# Patient Record
Sex: Male | Born: 1965
Health system: Southern US, Community
[De-identification: ages and names within clinical notes are randomized; demographics above are authoritative.]

## PROBLEM LIST (undated history)

## (undated) DIAGNOSIS — Z21 Asymptomatic human immunodeficiency virus [HIV] infection status: Secondary | ICD-10-CM

## (undated) DIAGNOSIS — J45909 Unspecified asthma, uncomplicated: Secondary | ICD-10-CM

## (undated) DIAGNOSIS — B20 Human immunodeficiency virus [HIV] disease: Secondary | ICD-10-CM

---

## 2014-02-19 ENCOUNTER — Emergency Department: Payer: Self-pay | Admitting: Emergency Medicine

## 2015-04-27 ENCOUNTER — Encounter (HOSPITAL_BASED_OUTPATIENT_CLINIC_OR_DEPARTMENT_OTHER): Payer: Self-pay | Admitting: Emergency Medicine

## 2015-04-27 ENCOUNTER — Emergency Department (HOSPITAL_BASED_OUTPATIENT_CLINIC_OR_DEPARTMENT_OTHER)
Admission: EM | Admit: 2015-04-27 | Discharge: 2015-04-27 | Disposition: A | Discharge status: Home / Self Care | Payer: Commercial Managed Care - PPO | Attending: Emergency Medicine | Admitting: Emergency Medicine

## 2015-04-27 DIAGNOSIS — J069 Acute upper respiratory infection, unspecified: Secondary | ICD-10-CM | POA: Diagnosis not present

## 2015-04-27 DIAGNOSIS — Z880 Allergy status to penicillin: Secondary | ICD-10-CM | POA: Insufficient documentation

## 2015-04-27 DIAGNOSIS — J45909 Unspecified asthma, uncomplicated: Secondary | ICD-10-CM

## 2015-04-27 DIAGNOSIS — J45901 Unspecified asthma with (acute) exacerbation: Secondary | ICD-10-CM | POA: Insufficient documentation

## 2015-04-27 DIAGNOSIS — R0602 Shortness of breath: Secondary | ICD-10-CM | POA: Diagnosis present

## 2015-04-27 DIAGNOSIS — Z7952 Long term (current) use of systemic steroids: Secondary | ICD-10-CM | POA: Diagnosis not present

## 2015-04-27 DIAGNOSIS — R110 Nausea: Secondary | ICD-10-CM | POA: Diagnosis not present

## 2015-04-27 MED ORDER — PREDNISONE 50 MG PO TABS
60.0000 mg | ORAL_TABLET | Freq: Once | ORAL | Status: AC
  Administered 2015-04-27: 60 mg via ORAL
  Filled 2015-04-27: qty 1

## 2015-04-27 NOTE — ED Notes (Signed)
Pt reports sob onset of Sunday thought he was catching a cold, so dismissed it and today presents with worsening sob

## 2015-04-27 NOTE — ED Provider Notes (Signed)
CSN: [id number]     Arrival date & time 04/27/15  3244 History   First MD Initiated Contact with Patient 04/27/15 [phone]     Chief Complaint  Patient presents with  . Shortness of Breath     (Consider location/radiation/quality/duration/timing/severity/associated sxs/prior Treatment) Patient is a 49 y.o. male presenting with shortness of breath. The history is provided by the patient.  Shortness of Breath Associated symptoms: cough and wheezing   Associated symptoms: no abdominal pain, no chest pain, no fever, no rash and no sore throat    patient presents shortness of breath and cough. Has had for last couple days. Has had breathing treatment and sterilized. States he vomited up a steroid-induced after coughing today. His history of asthma. He is overall pretty well-controlled. States he felt a little worse this morning was told to come in by his nurse. States he is feeling much better now. States he vomited up the prednisone this morning but has a Dosepak. No chest pain. No fevers. No real nausea but states he thinks he threw up because of his vitamins.  History reviewed. No pertinent past medical history. History reviewed. No pertinent past surgical history. History reviewed. No pertinent family history. Social History  Substance Use Topics  . Smoking status: Never Smoker   . Smokeless tobacco: None  . Alcohol Use: No    Review of Systems  Constitutional: Negative for fever and appetite change.  HENT: Negative for sinus pressure and sore throat.   Respiratory: Positive for cough, shortness of breath and wheezing.   Cardiovascular: Negative for chest pain.  Gastrointestinal: Positive for nausea. Negative for abdominal pain.  Skin: Negative for rash.      Allergies  Penicillins  Home Medications   Prior to Admission medications   Medication Sig Start Date End Date Taking? Authorizing Provider  albuterol (PROVENTIL HFA;VENTOLIN HFA) 108 (90 BASE) MCG/ACT inhaler Inhale into  the lungs every 6 (six) hours as needed for wheezing or shortness of breath.   Yes Historical Provider, MD  predniSONE (STERAPRED UNI-PAK 21 TAB) 10 MG (21) TBPK tablet Take 10 mg by mouth daily.   Yes Historical Provider, MD   BP 144/105 mmHg  Pulse 87  Temp(Src) 98.1 F (36.7 C) (Oral)  Resp 20  Ht  (1.88 m)  Wt 220 lb (99.791 kg)  BMI 28.23 kg/m2  SpO2 100% Physical Exam  Constitutional: He appears well-developed.  Cardiovascular: Normal rate.   Pulmonary/Chest: He has wheezes.  Few scattered wheezes and some slightly prolonged expirations.  Abdominal: Soft.  Musculoskeletal: Normal range of motion.  Neurological: He is alert.    ED Course  Procedures (including critical care time) Labs Review Labs Reviewed - No data to display  Imaging Review No results found. I have personally reviewed and evaluated these images and lab results as part of my medical decision-making.   EKG Interpretation None      MDM   Final diagnoses:  URI (upper respiratory infection)  Asthma, unspecified asthma severity, uncomplicated    Patient with likely viral syndrome drum and bronchospasm. Overall well-appearing. States he feels better. Patient states he is only mild nausea and does not want and antiemetics. Will give dose of cirrhosis here discharge home.    Benjiman Core, MD 04/27/15 1026

## 2015-04-27 NOTE — Discharge Instructions (Signed)
Asthma, Acute Bronchospasm  Acute bronchospasm caused by asthma is also referred to as an asthma attack. Bronchospasm means your air passages become narrowed. The narrowing is caused by inflammation and tightening of the muscles in the air tubes (bronchi) in your lungs. This can make it hard to breathe or cause you to wheeze and cough.  CAUSES  Possible triggers are:   Animal dander from the skin, hair, or feathers of animals.   Dust mites contained in house dust.   Cockroaches.   Pollen from trees or grass.   Mold.   Cigarette or tobacco smoke.   Air pollutants such as dust, household cleaners, hair sprays, aerosol sprays, paint fumes, strong chemicals, or strong odors.   Cold air or weather changes. Cold air may trigger inflammation. Winds increase molds and pollens in the air.   Strong emotions such as crying or laughing hard.   Stress.   Certain medicines such as aspirin or beta-blockers.   Sulfites in foods and drinks, such as dried fruits and wine.   Infections or inflammatory conditions, such as a flu, cold, or inflammation of the nasal membranes (rhinitis).   Gastroesophageal reflux disease (GERD). GERD is a condition where stomach acid backs up into your esophagus.   Exercise or strenuous activity.  SIGNS AND SYMPTOMS    Wheezing.   Excessive coughing, particularly at night.   Chest tightness.   Shortness of breath.  DIAGNOSIS   Your health care provider will ask you about your medical history and perform a physical exam. A chest X-ray or blood testing may be performed to look for other causes of your symptoms or other conditions that may have triggered your asthma attack.  TREATMENT   Treatment is aimed at reducing inflammation and opening up the airways in your lungs. Most asthma attacks are treated with inhaled medicines. These include quick relief or rescue medicines (such as bronchodilators) and controller medicines (such as inhaled corticosteroids). These medicines are sometimes  given through an inhaler or a nebulizer. Systemic steroid medicine taken by mouth or given through an IV tube also can be used to reduce the inflammation when an attack is moderate or severe. Antibiotic medicines are only used if a bacterial infection is present.   HOME CARE INSTRUCTIONS    Rest.   Drink plenty of liquids. This helps the mucus to remain thin and be easily coughed up. Only use caffeine in moderation and do not use alcohol until you have recovered from your illness.   Do not smoke. Avoid being exposed to secondhand smoke.   You play a critical role in keeping yourself in good health. Avoid exposure to things that cause you to wheeze or to have breathing problems.   Keep your medicines up-to-date and available. Carefully follow your health care provider's treatment plan.   Take your medicine exactly as prescribed.   When pollen or pollution is bad, keep windows closed and use an air conditioner or go to places with air conditioning.   Asthma requires careful medical care. See your health care provider for a follow-up as advised. If you are more than [redacted] weeks pregnant and you were prescribed any new medicines, let your obstetrician know about the visit and how you are doing. Follow up with your health care provider as directed.   After you have recovered from your asthma attack, make an appointment with your outpatient doctor to talk about ways to reduce the likelihood of future attacks. If you do not have a doctor   who manages your asthma, make an appointment with a primary care doctor to discuss your asthma.  SEEK IMMEDIATE MEDICAL CARE IF:    You are getting worse.   You have trouble breathing. If severe, call your local emergency services (911 in the [place]   You develop chest pain or discomfort.   You are vomiting.   You are not able to keep fluids down.   You are coughing up yellow, green, brown, or bloody sputum.   You have a fever and your symptoms suddenly get worse.   You have  trouble swallowing.  MAKE SURE YOU:    Understand these instructions.   Will watch your condition.   Will get help right away if you are not doing well or get worse.     This information is not intended to replace advice given to you by your health care provider. Make sure you discuss any questions you have with your health care provider.     Document Released: 08/30/2006 Document Revised: 05/20/2013 Document Reviewed: 11/20/2012  Elsevier Interactive Patient Education 2016 [employer]    Upper Respiratory Infection, Adult  Most upper respiratory infections (URIs) are a viral infection of the air passages leading to the lungs. A URI affects the nose, throat, and upper air passages. The most common type of URI is nasopharyngitis and is typically referred to as "the common cold."  URIs run their course and usually go away on their own. Most of the time, a URI does not require medical attention, but sometimes a bacterial infection in the upper airways can follow a viral infection. This is called a secondary infection. Sinus and middle ear infections are common types of secondary upper respiratory infections.  Bacterial pneumonia can also complicate a URI. A URI can worsen asthma and chronic obstructive pulmonary disease (COPD). Sometimes, these complications can require emergency medical care and may be life threatening.   CAUSES  Almost all URIs are caused by viruses. A virus is a type of germ and can spread from one person to another.   RISKS FACTORS  You may be at risk for a URI if:    You smoke.    You have chronic heart or lung disease.   You have a weakened defense (immune) system.    You are very young or very old.    You have nasal allergies or asthma.   You work in crowded or poorly ventilated areas.   You work in health care facilities or schools.  SIGNS AND SYMPTOMS   Symptoms typically develop 2-3 days after you come in contact with a cold virus. Most viral URIs last 7-10 days. However,  viral URIs from the influenza virus (flu virus) can last 14-18 days and are typically more severe. Symptoms may include:    Runny or stuffy (congested) nose.    Sneezing.    Cough.    Sore throat.    Headache.    Fatigue.    Fever.    Loss of appetite.    Pain in your forehead, behind your eyes, and over your cheekbones (sinus pain).   Muscle aches.   DIAGNOSIS   Your health care provider may diagnose a URI by:   Physical exam.   Tests to check that your symptoms are not due to another condition such as:   Strep throat.   Sinusitis.   Pneumonia.   Asthma.  TREATMENT   A URI goes away on its own with time. It cannot be cured   with medicines, but medicines may be prescribed or recommended to relieve symptoms. Medicines may help:   Reduce your fever.   Reduce your cough.   Relieve nasal congestion.  HOME CARE INSTRUCTIONS    Take medicines only as directed by your health care provider.    Gargle warm saltwater or take cough drops to comfort your throat as directed by your health care provider.   Use a warm mist humidifier or inhale steam from a shower to increase air moisture. This may make it easier to breathe.   Drink enough fluid to keep your urine clear or pale yellow.    Eat soups and other clear broths and maintain good nutrition.    Rest as needed.    Return to work when your temperature has returned to normal or as your health care provider advises. You may need to stay home longer to avoid infecting others. You can also use a face mask and careful hand washing to prevent spread of the virus.   Increase the usage of your inhaler if you have asthma.    Do not use any tobacco products, including cigarettes, chewing tobacco, or electronic cigarettes. If you need help quitting, ask your health care provider.  PREVENTION   The best way to protect yourself from getting a cold is to practice good hygiene.    Avoid oral or hand contact with people with cold symptoms.    Wash  your hands often if contact occurs.   There is no clear evidence that vitamin C, vitamin E, echinacea, or exercise reduces the chance of developing a cold. However, it is always recommended to get plenty of rest, exercise, and practice good nutrition.   SEEK MEDICAL CARE IF:    You are getting worse rather than better.    Your symptoms are not controlled by medicine.    You have chills.   You have worsening shortness of breath.   You have brown or red mucus.   You have yellow or brown nasal discharge.   You have pain in your face, especially when you bend forward.   You have a fever.   You have swollen neck glands.   You have pain while swallowing.   You have white areas in the back of your throat.  SEEK IMMEDIATE MEDICAL CARE IF:    You have severe or persistent:    Headache.    Ear pain.    Sinus pain.    Chest pain.   You have chronic lung disease and any of the following:    Wheezing.    Prolonged cough.    Coughing up blood.    A change in your usual mucus.   You have a stiff neck.   You have changes in your:    Vision.    Hearing.    Thinking.    Mood.  MAKE SURE YOU:    Understand these instructions.   Will watch your condition.   Will get help right away if you are not doing well or get worse.     This information is not intended to replace advice given to you by your health care provider. Make sure you discuss any questions you have with your health care provider.     Document Released: 11/08/2000 Document Revised: 09/29/2014 Document Reviewed: 08/20/2013  Elsevier Interactive Patient Education 2016 [employer]

## 2017-08-14 ENCOUNTER — Encounter (HOSPITAL_COMMUNITY): Payer: Self-pay

## 2017-08-14 ENCOUNTER — Other Ambulatory Visit: Payer: Self-pay

## 2017-08-14 ENCOUNTER — Emergency Department (HOSPITAL_COMMUNITY)
Admission: EM | Admit: 2017-08-14 | Discharge: 2017-08-14 | Disposition: A | Discharge status: Home / Self Care | Payer: Commercial Managed Care - PPO | Attending: Emergency Medicine | Admitting: Emergency Medicine

## 2017-08-14 DIAGNOSIS — F17210 Nicotine dependence, cigarettes, uncomplicated: Secondary | ICD-10-CM | POA: Diagnosis not present

## 2017-08-14 DIAGNOSIS — B029 Zoster without complications: Secondary | ICD-10-CM

## 2017-08-14 DIAGNOSIS — R21 Rash and other nonspecific skin eruption: Secondary | ICD-10-CM | POA: Diagnosis present

## 2017-08-14 HISTORY — DX: Unspecified asthma, uncomplicated: J45.909

## 2017-08-14 MED ORDER — VALACYCLOVIR HCL 1 G PO TABS: 1000.0000 mg | ORAL_TABLET | Freq: Three times a day (TID) | ORAL | 0 refills | Status: DC

## 2017-08-14 MED ORDER — OXYCODONE-ACETAMINOPHEN 5-325 MG PO TABS
2.0000 | ORAL_TABLET | Freq: Once | ORAL | Status: AC
  Administered 2017-08-14: 2 via ORAL
  Filled 2017-08-14: qty 2

## 2017-08-14 MED ORDER — OXYCODONE-ACETAMINOPHEN 5-325 MG PO TABS: 2.0000 | ORAL_TABLET | ORAL | 0 refills | Status: DC | PRN

## 2017-08-14 NOTE — ED Notes (Signed)
Patient states he has a ride on the way to pick him up since we are giving him pain medication. Pt requesting a work note to return next Monday. Dr. Freida Busman to clarify before patient d/c.

## 2017-08-14 NOTE — ED Triage Notes (Signed)
Patient reports that he developed a rash on the left side of his head 4 days. Patient states he went to an UC 3 days ago and received a steroid IM and was placed on clindamycin. Patient states he is now nauseated and the rash and swelling to the head is worse.

## 2017-08-14 NOTE — ED Provider Notes (Signed)
[facility]-EMERGENCY DEPT Provider Note   CSN: [id number] Arrival date & time: 08/14/17  9562     History   Chief Complaint Chief Complaint  Patient presents with  . David [name]    HPI David David [name] is a 52 y.o. male.  53 year old male presents with David [name] to the left side of his face times 3 days.  Was seen at urgent care and prescribed clindamycin.  Also given an IM injection of steroids.  His symptoms have since worsened and he characterizes his rashes blisterlike as well as with a burning sensation to his skin.  Denies any hearing or vertigo symptoms.  No visual changes.  No fever or chills.  Nothing makes his symptoms better      Past Medical History:  Diagnosis Date  . Asthma     There are no active problems to display for this patient.   History reviewed. No pertinent surgical history.     Home Medications    Prior to Admission medications   Not on File    Family History Family History  Problem Relation Age of Onset  . Cancer Mother     Social History Social History   Tobacco Use  . Smoking status: Current Every Day Smoker    Packs/day: 0.25    Types: Cigarettes  . Smokeless tobacco: Never Used  Substance Use Topics  . Alcohol use: No    Frequency: Never  . Drug use: No     Allergies   Penicillins   Review of Systems Review of Systems  All other systems reviewed and are negative.    Physical Exam Updated Vital Signs BP (!) 178/107 (BP Location: Right Arm)   Pulse 93   Temp 98.5 F (36.9 C) (Oral)   Resp 16   Ht 1.88 m (6\' 2" )   Wt 97.5 kg (215 lb)   SpO2 97%   BMI 27.60 kg/m   Physical Exam  Constitutional: He is oriented to person, place, and time. He appears well-developed and well-nourished.  Non-toxic appearance. No distress.  HENT:  Head: Normocephalic and atraumatic.    Eyes: Conjunctivae, EOM and lids are normal. Pupils are equal, round, and reactive to light.  Neck: Normal range of motion. Neck  supple. No tracheal deviation present. No thyroid mass present.  Cardiovascular: Normal rate, regular rhythm and normal heart sounds. Exam reveals no gallop.  No murmur heard. Pulmonary/Chest: Effort normal and breath sounds normal. No stridor. No respiratory distress. He has no decreased breath sounds. He has no wheezes. He has no rhonchi. He has no rales.  Abdominal: Soft. Normal appearance and bowel sounds are normal. He exhibits no distension. There is no tenderness. There is no rebound and no CVA tenderness.  Musculoskeletal: Normal range of motion. He exhibits no edema or tenderness.  Neurological: He is alert and oriented to person, place, and time. He has normal strength. No cranial nerve deficit or sensory deficit. GCS eye subscore is 4. GCS verbal subscore is 5. GCS motor subscore is 6.  Skin: Skin is warm and dry. David [name] noted. No abrasion noted. David [name] is papular and vesicular.  Psychiatric: He has a normal mood and affect. His speech is normal and behavior is normal.  Nursing note and vitals reviewed.    ED Treatments / Results  Labs (all labs ordered are listed, but only abnormal results are displayed) Labs Reviewed - No data to display  EKG  EKG Interpretation None       Radiology No results  found.  Procedures Procedures (including critical care time)  Medications Ordered in ED Medications  oxyCODONE-acetaminophen (PERCOCET/ROXICET) 5-325 MG per tablet 2 tablet (not administered)     Initial Impression / Assessment and Plan / ED Course  I have reviewed the triage vital signs and the nursing notes.  Pertinent labs & imaging results that were available during my care of the patient were reviewed by me and considered in my medical decision making (see chart for details).    Patient's David [name] consistent with shingles.  Will prescribe Percocet here for pain.  Also placed on Valtrex.  Return precautions given  Final Clinical Impressions(s) / ED Diagnoses   Final  diagnoses:  None    ED Discharge Orders    None       Lorre Nick, MD 08/14/17 [id number]

## 2017-08-14 NOTE — Discharge Instructions (Signed)
Continue taking clindamycin.  Return here for fever or any other problems

## 2017-08-15 ENCOUNTER — Encounter (HOSPITAL_BASED_OUTPATIENT_CLINIC_OR_DEPARTMENT_OTHER): Payer: Self-pay | Admitting: Emergency Medicine

## 2018-12-09 ENCOUNTER — Emergency Department: Payer: 59

## 2018-12-09 ENCOUNTER — Emergency Department
Admission: EM | Admit: 2018-12-09 | Discharge: 2018-12-09 | Disposition: A | Discharge status: Home / Self Care | Payer: 59 | Attending: Emergency Medicine | Admitting: Emergency Medicine

## 2018-12-09 ENCOUNTER — Encounter: Payer: Self-pay | Admitting: Emergency Medicine

## 2018-12-09 ENCOUNTER — Other Ambulatory Visit: Payer: Self-pay

## 2018-12-09 DIAGNOSIS — R062 Wheezing: Secondary | ICD-10-CM | POA: Diagnosis present

## 2018-12-09 DIAGNOSIS — Z20828 Contact with and (suspected) exposure to other viral communicable diseases: Secondary | ICD-10-CM | POA: Diagnosis not present

## 2018-12-09 DIAGNOSIS — F17210 Nicotine dependence, cigarettes, uncomplicated: Secondary | ICD-10-CM | POA: Insufficient documentation

## 2018-12-09 DIAGNOSIS — J45901 Unspecified asthma with (acute) exacerbation: Secondary | ICD-10-CM | POA: Diagnosis not present

## 2018-12-09 LAB — BASIC METABOLIC PANEL
Anion gap: 6 (ref 5–15)
BUN: 17 mg/dL (ref 6–20)
CO2: 25 mmol/L (ref 22–32)
Calcium: 8.7 mg/dL — ABNORMAL LOW (ref 8.9–10.3)
Chloride: 105 mmol/L (ref 98–111)
Creatinine, Ser: 1.15 mg/dL (ref 0.61–1.24)
GFR calc Af Amer: >60 mL/min (ref >60)
GFR calc non Af Amer: >60 mL/min (ref >60)
Glucose, Bld: 110 mg/dL — ABNORMAL HIGH (ref 70–99)
Potassium: 4.3 mmol/L (ref 3.5–5.1)
Sodium: 136 mmol/L (ref 135–145)

## 2018-12-09 LAB — CBC
HCT: 36.9 % — ABNORMAL LOW (ref 39.0–52.0)
Hemoglobin: 12.2 g/dL — ABNORMAL LOW (ref 13.0–17.0)
MCH: 33.2 pg (ref 26.0–34.0)
MCHC: 33.1 g/dL (ref 30.0–36.0)
MCV: 100.3 fL — ABNORMAL HIGH (ref 80.0–100.0)
Platelets: 254 10*3/uL (ref 150–400)
RBC: 3.68 MIL/uL — ABNORMAL LOW (ref 4.22–5.81)
RDW: 12.3 % (ref 11.5–15.5)
WBC: 7.8 10*3/uL (ref 4.0–10.5)
nRBC: 0 % (ref 0.0–0.2)

## 2018-12-09 LAB — SARS CORONAVIRUS 2 BY RT PCR (HOSPITAL ORDER, PERFORMED IN ~~LOC~~ HOSPITAL LAB): SARS Coronavirus 2: NEGATIVE

## 2018-12-09 MED ORDER — IPRATROPIUM-ALBUTEROL 0.5-2.5 (3) MG/3ML IN SOLN
3.0000 mL | Freq: Once | RESPIRATORY_TRACT | Status: AC
  Administered 2018-12-09: 3 mL via RESPIRATORY_TRACT
  Filled 2018-12-09: qty 3

## 2018-12-09 MED ORDER — ALBUTEROL SULFATE HFA 108 (90 BASE) MCG/ACT IN AERS
2.0000 | INHALATION_SPRAY | Freq: Once | RESPIRATORY_TRACT | Status: AC
  Administered 2018-12-09: 2 via RESPIRATORY_TRACT

## 2018-12-09 MED ORDER — METHYLPREDNISOLONE SODIUM SUCC 125 MG IJ SOLR
125.0000 mg | INTRAMUSCULAR | Status: AC
  Administered 2018-12-09: 125 mg via INTRAVENOUS
  Filled 2018-12-09: qty 2

## 2018-12-09 MED ORDER — ALBUTEROL SULFATE (2.5 MG/3ML) 0.083% IN NEBU
INHALATION_SOLUTION | RESPIRATORY_TRACT | Status: AC
  Filled 2018-12-09: qty 6

## 2018-12-09 MED ORDER — ALBUTEROL SULFATE (2.5 MG/3ML) 0.083% IN NEBU: 2.5000 mg | INHALATION_SOLUTION | Freq: Four times a day (QID) | RESPIRATORY_TRACT | 1 refills | Status: DC | PRN

## 2018-12-09 MED ORDER — IPRATROPIUM BROMIDE HFA 17 MCG/ACT IN AERS
2.0000 | INHALATION_SPRAY | Freq: Once | RESPIRATORY_TRACT | Status: AC
  Administered 2018-12-09: 2 via RESPIRATORY_TRACT
  Filled 2018-12-09 (×2): qty 12.9

## 2018-12-09 MED ORDER — ALBUTEROL SULFATE HFA 108 (90 BASE) MCG/ACT IN AERS
4.0000 | INHALATION_SPRAY | RESPIRATORY_TRACT | Status: DC | PRN
  Administered 2018-12-09: 4 via RESPIRATORY_TRACT
  Filled 2018-12-09: qty 6.7

## 2018-12-09 MED ORDER — MAGNESIUM SULFATE 2 GM/50ML IV SOLN
2.0000 g | Freq: Once | INTRAVENOUS | Status: AC
  Administered 2018-12-09: 2 g via INTRAVENOUS
  Filled 2018-12-09: qty 50

## 2018-12-09 MED ORDER — ALBUTEROL SULFATE HFA 108 (90 BASE) MCG/ACT IN AERS: 2.0000 | INHALATION_SPRAY | Freq: Four times a day (QID) | RESPIRATORY_TRACT | 1 refills | Status: DC | PRN

## 2018-12-09 MED ORDER — PREDNISONE 20 MG PO TABS: 40.0000 mg | ORAL_TABLET | Freq: Every day | ORAL | 0 refills | Status: DC

## 2018-12-09 NOTE — ED Provider Notes (Signed)
[facility] Emergency Department Provider Note  ____________________________________________   First MD Initiated Contact with Patient 12/09/18 [phone]     (approximate)  I have reviewed the triage vital signs and the nursing notes.   HISTORY  Chief Complaint Wheezing  HPI David [name] is a 53 y.o. male here for evaluation of  asthma  Patient reports couple days ago started get slight nasal congestion, then yesterday started develop wheezing and shortness of breath.  However he ran out of his nebulizers and did not have any refill on his inhaler.  So reports throughout the day got more short of breath and wheezing  He denies fevers or chills.  No known exposure to coronavirus.  Reports a known history of asthma  No chest pain.  No loss of taste or smell.  She reports his wheezing slight dry cough that he reports goes along with his asthma.  Is been treated in the past with inhalers and nebulizers that worked well he just did not have any  Past Medical History:  Diagnosis Date  . Asthma     There are no active problems to display for this patient.   History reviewed. No pertinent surgical history.  Prior to Admission medications   Medication Sig Start Date End Date Taking? Authorizing Provider  albuterol (PROVENTIL) (2.5 MG/3ML) 0.083% nebulizer solution Take 3 mLs (2.5 mg total) by nebulization every 6 (six) hours as needed for wheezing or shortness of breath. 12/09/18   Sharyn CreamerQuale, Dayanis Bergquist, MD  albuterol (VENTOLIN HFA) 108 (90 Base) MCG/ACT inhaler Inhale 2 puffs into the lungs every 6 (six) hours as needed for wheezing or shortness of breath. 12/09/18   Sharyn CreamerQuale, Leialoha Hanna, MD  predniSONE (DELTASONE) 20 MG tablet Take 2 tablets (40 mg total) by mouth daily. 12/09/18   Sharyn CreamerQuale, Findley Blankenbaker, MD    Allergies Penicillins and Penicillins  Family History  Problem Relation Age of Onset  . Cancer Mother     Social History Social History   Tobacco Use  . Smoking  status: Current Every Day Smoker    Packs/day: 0.25    Types: Cigarettes  . Smokeless tobacco: Never Used  Substance Use Topics  . Alcohol use: No    Frequency: Never  . Drug use: No    Review of Systems Constitutional: No fever/chills Eyes: No visual changes. ENT: No sore throat.  Did have just a slight amount nasal congestion last couple days. Cardiovascular: Denies chest pain. Respiratory: See HPI Gastrointestinal: No abdominal pain.   Genitourinary: Negative for dysuria. Musculoskeletal: Negative for back pain. Skin: Negative for rash. Neurological: Negative for headaches, areas of focal weakness or numbness.    ____________________________________________   PHYSICAL EXAM:  VITAL SIGNS: ED Triage Vitals  Enc Vitals Group     BP 12/09/18 0814 (!) 164/101     Pulse Rate 12/09/18 0814 100     Resp 12/09/18 0814 (!) 22     Temp 12/09/18 0814 98.1 F (36.7 C)     Temp Source 12/09/18 0814 Oral     SpO2 12/09/18 0814 93 %     Weight 12/09/18 0812 214 lb 15.2 oz (97.5 kg)     Height 12/09/18 0812 6\' 2"  (1.88 m)     Head Circumference --      Peak Flow --      Pain Score 12/09/18 0812 0     Pain Loc --      Pain Edu? --      Excl. in GC? --  Constitutional: Alert and oriented. Well appearing with mild increased work of breathing and slight accessory muscle use. Eyes: Conjunctivae are normal. Head: Atraumatic. Nose: No congestion/rhinnorhea. Mouth/Throat: Mucous membranes are moist. Neck: No stridor.  Cardiovascular: Normal rate, regular rhythm. Grossly normal heart sounds.  Good peripheral circulation. Respiratory: Slightly increased work of breathing.  Mild use of accessory muscles.  Oxygen saturation about 91 to 92% on room air.  Diffuse end expiratory wheezing without crackles. Gastrointestinal: Soft and nontender. No distention. Musculoskeletal: No lower extremity tenderness nor edema. Neurologic:  Normal speech and language. No gross focal neurologic  deficits are appreciated.  Skin:  Skin is warm, dry and intact. No rash noted. Psychiatric: Mood and affect are normal. Speech and behavior are normal.  ____________________________________________   LABS (all labs ordered are listed, but only abnormal results are displayed)  Labs Reviewed  CBC - Abnormal; Notable for the following components:      Result Value   RBC 3.68 (*)    Hemoglobin 12.2 (*)    HCT 36.9 (*)    MCV 100.3 (*)    All other components within normal limits  BASIC METABOLIC PANEL - Abnormal; Notable for the following components:   Glucose, Bld 110 (*)    Calcium 8.7 (*)    All other components within normal limits  SARS CORONAVIRUS 2 (HOSPITAL ORDER, [facility])   ____________________________________________  EKG  Reviewed and interpreted by me at 850 Heart rate 90 QRS 90 QTc 480 Normal sinus rhythm, minimal prolongation of the QT interval ____________________________________________  RADIOLOGY  Dg Chest Port 1 View  Result Date: 12/09/2018 CLINICAL DATA:  Wheezing EXAM: PORTABLE CHEST 1 VIEW COMPARISON:  February 19, 2014 FINDINGS: Lungs are clear. Heart size and pulmonary vascularity are normal. No adenopathy. No bone lesions. IMPRESSION: No edema or consolidation. Comment: Apparent nodular opacity on the right noted previously is no longer evident. Electronically Signed   By: Lowella Grip III M.D.   On: 12/09/2018 09:06    Imaging reviewed negative for acute. ____________________________________________   PROCEDURES  Procedure(s) performed: None  Procedures  Critical Care performed: No  ____________________________________________   INITIAL IMPRESSION / ASSESSMENT AND PLAN / ED COURSE  Pertinent labs & imaging results that were available during my care of the patient were reviewed by me and considered in my medical decision making (see chart for details).   Clinical history most suggestive of asthma  exacerbation, likely exacerbated by not having his albuterol inhaler available.  Denies acute infectious symptoms other than slight nasal congestion, will check rapid COVID as he does report nasal congestion no overall I suspect unlikely.  We will treat him now with MDIs until we have COVID test returned.  Clinical Course as of Dec 09 1135  Mon Dec 09, 2018  0918 Patient breathing more comfortably, reports he is feeling a lot better.  He appears to be respirating comfortably now and in no distress.  Improved   [MQ]    Clinical Course User Index [MQ] Delman Kitten, MD    Vitals:   12/09/18 0902 12/09/18 0930  BP: (!) 143/96 (!) 137/94  Pulse: 88 81  Resp: 18 17  Temp:    SpO2: 100% 96%   Oxygen saturations work of breathing much improved  ----------------------------------------- 11:37 AM on 12/09/2018 -----------------------------------------  Patient feeling much improved.  Patient requesting be discharged reports his breathing feels much better.  Will prescribe albuterol MDI as well as nebulizer solution as he utilizes both intermittently.  Steroids.  Recommended follow-up with [facility].  Patient well-appearing no distress much improved.  Speaking in full clear sentences with just minimal wheeze appreciable with otherwise very much clear lung sounds.  ____________________________________________   FINAL CLINICAL IMPRESSION(S) / ED DIAGNOSES  Final diagnoses:  Moderate asthma with exacerbation, unspecified whether persistent        Note:  This document was prepared using Dragon voice recognition software and may include unintentional dictation errors       Sharyn Creamer, MD 12/09/18 1138

## 2018-12-09 NOTE — ED Notes (Signed)
ED Provider at bedside. 

## 2018-12-09 NOTE — ED Notes (Signed)
Hx of asthma, recent stress SOB and wheezing since Friday. Out of albuterol inhaler and nebulizer. No cough, fever or exposures to COVID

## 2018-12-09 NOTE — ED Notes (Signed)
Pt alert and oriented X4, active, cooperative, pt in NAD. RR even and unlabored, color WNL.  Pt informed to return if any life threatening symptoms occur.  Discharge and followup instructions reviewed.  

## 2018-12-09 NOTE — Discharge Instructions (Signed)
We believe that your symptoms are caused today by an exacerbation of your asthma.  Please take the prescribed medications and any medications that you have at home.  Follow up with your doctor as recommended.  If you develop any new or worsening symptoms, including but not limited to fever, persistent vomiting, worsening shortness of breath, or other symptoms that concern you, please return to the Emergency Department immediately.  

## 2018-12-09 NOTE — ED Notes (Signed)
Pt states that he feels much better and is ready to go home. RR even and unlabored. Pt in NAD.

## 2018-12-09 NOTE — ED Triage Notes (Signed)
C/O Asthma attack x 1 day.  States out of inhaler.  Patient is AAOx3.  Skin warm and dry.  DOE noted.

## 2020-05-21 ENCOUNTER — Other Ambulatory Visit: Payer: Self-pay

## 2020-05-21 ENCOUNTER — Observation Stay
Admission: EM | Admit: 2020-05-21 | Discharge: 2020-05-22 | Disposition: A | Discharge status: Home / Self Care | Payer: PRIVATE HEALTH INSURANCE | Attending: Internal Medicine | Admitting: Internal Medicine

## 2020-05-21 DIAGNOSIS — R0602 Shortness of breath: Secondary | ICD-10-CM | POA: Diagnosis present

## 2020-05-21 DIAGNOSIS — U071 COVID-19: Secondary | ICD-10-CM | POA: Diagnosis not present

## 2020-05-21 DIAGNOSIS — J9601 Acute respiratory failure with hypoxia: Secondary | ICD-10-CM

## 2020-05-21 DIAGNOSIS — Z7951 Long term (current) use of inhaled steroids: Secondary | ICD-10-CM | POA: Insufficient documentation

## 2020-05-21 DIAGNOSIS — Z760 Encounter for issue of repeat prescription: Secondary | ICD-10-CM

## 2020-05-21 DIAGNOSIS — J4531 Mild persistent asthma with (acute) exacerbation: Secondary | ICD-10-CM

## 2020-05-21 DIAGNOSIS — J45901 Unspecified asthma with (acute) exacerbation: Secondary | ICD-10-CM

## 2020-05-21 NOTE — ED Triage Notes (Signed)
Pt with shob, pt with tachypnea noted is able to speak in short sentences, wheezing noted. Pt states he is out of his albuterol.

## 2020-05-22 ENCOUNTER — Emergency Department: Payer: PRIVATE HEALTH INSURANCE

## 2020-05-22 ENCOUNTER — Other Ambulatory Visit: Payer: Self-pay

## 2020-05-22 DIAGNOSIS — J45909 Unspecified asthma, uncomplicated: Secondary | ICD-10-CM | POA: Insufficient documentation

## 2020-05-22 DIAGNOSIS — J4531 Mild persistent asthma with (acute) exacerbation: Secondary | ICD-10-CM | POA: Diagnosis not present

## 2020-05-22 DIAGNOSIS — J9601 Acute respiratory failure with hypoxia: Secondary | ICD-10-CM

## 2020-05-22 DIAGNOSIS — U071 COVID-19: Secondary | ICD-10-CM

## 2020-05-22 LAB — CBC WITH DIFFERENTIAL/PLATELET
Abs Immature Granulocytes: 0.04 10*3/uL (ref 0.00–0.07)
Basophils Absolute: 0.1 10*3/uL (ref 0.0–0.1)
Basophils Relative: 1 %
Eosinophils Absolute: 0.4 10*3/uL (ref 0.0–0.5)
Eosinophils Relative: 5 %
HCT: 38.6 % — ABNORMAL LOW (ref 39.0–52.0)
Hemoglobin: 12.8 g/dL — ABNORMAL LOW (ref 13.0–17.0)
Immature Granulocytes: 1 %
Lymphocytes Relative: 21 %
Lymphs Abs: 1.8 10*3/uL (ref 0.7–4.0)
MCH: 33.3 pg (ref 26.0–34.0)
MCHC: 33.2 g/dL (ref 30.0–36.0)
MCV: 100.5 fL — ABNORMAL HIGH (ref 80.0–100.0)
Monocytes Absolute: 0.5 10*3/uL (ref 0.1–1.0)
Monocytes Relative: 6 %
Neutro Abs: 5.6 10*3/uL (ref 1.7–7.7)
Neutrophils Relative %: 66 %
Platelets: 206 10*3/uL (ref 150–400)
RBC: 3.84 MIL/uL — ABNORMAL LOW (ref 4.22–5.81)
RDW: 12.5 % (ref 11.5–15.5)
WBC: 8.3 10*3/uL (ref 4.0–10.5)
nRBC: 0 % (ref 0.0–0.2)

## 2020-05-22 LAB — COMPREHENSIVE METABOLIC PANEL
ALT: 17 U/L (ref 0–44)
AST: 20 U/L (ref 15–41)
Albumin: 3.9 g/dL (ref 3.5–5.0)
Alkaline Phosphatase: 87 U/L (ref 38–126)
Anion gap: 6 (ref 5–15)
BUN: 10 mg/dL (ref 6–20)
CO2: 29 mmol/L (ref 22–32)
Calcium: 8.8 mg/dL — ABNORMAL LOW (ref 8.9–10.3)
Chloride: 99 mmol/L (ref 98–111)
Creatinine, Ser: 1.24 mg/dL (ref 0.61–1.24)
GFR, Estimated: >60 mL/min (ref >60)
Glucose, Bld: 91 mg/dL (ref 70–99)
Potassium: 3.6 mmol/L (ref 3.5–5.1)
Sodium: 134 mmol/L — ABNORMAL LOW (ref 135–145)
Total Bilirubin: 1.6 mg/dL — ABNORMAL HIGH (ref 0.3–1.2)
Total Protein: 9.4 g/dL — ABNORMAL HIGH (ref 6.5–8.1)

## 2020-05-22 LAB — RESP PANEL BY RT-PCR (FLU A&B, COVID) ARPGX2
Influenza A by PCR: NEGATIVE
Influenza B by PCR: NEGATIVE
SARS Coronavirus 2 by RT PCR: POSITIVE — AB

## 2020-05-22 LAB — TROPONIN I (HIGH SENSITIVITY)
Troponin I (High Sensitivity): 6 ng/L (ref <18)
Troponin I (High Sensitivity): 11 ng/L (ref <18)

## 2020-05-22 LAB — FIBRIN DERIVATIVES D-DIMER (ARMC ONLY): Fibrin derivatives D-dimer (ARMC): 564.94 ng/mL (FEU) — ABNORMAL HIGH (ref 0.00–499.00)

## 2020-05-22 LAB — FERRITIN: Ferritin: 196 ng/mL (ref 24–336)

## 2020-05-22 LAB — C-REACTIVE PROTEIN: CRP: 0.9 mg/dL (ref <1.0)

## 2020-05-22 LAB — TRIGLYCERIDES: Triglycerides: 92 mg/dL (ref <150)

## 2020-05-22 LAB — FIBRINOGEN: Fibrinogen: 465 mg/dL (ref 210–475)

## 2020-05-22 LAB — BRAIN NATRIURETIC PEPTIDE: B Natriuretic Peptide: 33.2 pg/mL (ref 0.0–100.0)

## 2020-05-22 LAB — PROCALCITONIN: Procalcitonin: 0.25 ng/mL

## 2020-05-22 LAB — LACTATE DEHYDROGENASE: LDH: 153 U/L (ref 98–192)

## 2020-05-22 MED ORDER — ZINC SULFATE 220 (50 ZN) MG PO CAPS
220.0000 mg | ORAL_CAPSULE | Freq: Every day | ORAL | Status: DC
  Administered 2020-05-22: 220 mg via ORAL
  Filled 2020-05-22: qty 1

## 2020-05-22 MED ORDER — GUAIFENESIN-DM 100-10 MG/5ML PO SYRP: 10.0000 mL | ORAL_SOLUTION | ORAL | 0 refills | Status: DC | PRN

## 2020-05-22 MED ORDER — FAMOTIDINE IN NACL 20-0.9 MG/50ML-% IV SOLN: 20.0000 mg | Freq: Once | INTRAVENOUS | Status: DC | PRN

## 2020-05-22 MED ORDER — EPINEPHRINE 0.3 MG/0.3ML IJ SOAJ: 0.3000 mg | Freq: Once | INTRAMUSCULAR | Status: DC | PRN

## 2020-05-22 MED ORDER — ZINC SULFATE 220 (50 ZN) MG PO CAPS: 220.0000 mg | ORAL_CAPSULE | Freq: Every day | ORAL | 0 refills | Status: DC

## 2020-05-22 MED ORDER — ALBUTEROL SULFATE HFA 108 (90 BASE) MCG/ACT IN AERS
2.0000 | INHALATION_SPRAY | Freq: Once | RESPIRATORY_TRACT | Status: DC | PRN
  Filled 2020-05-22: qty 6.7

## 2020-05-22 MED ORDER — ASCORBIC ACID 500 MG PO TABS: 500.0000 mg | ORAL_TABLET | Freq: Every day | ORAL | 0 refills | Status: DC

## 2020-05-22 MED ORDER — ENOXAPARIN SODIUM 40 MG/0.4ML ~~LOC~~ SOLN
40.0000 mg | SUBCUTANEOUS | Status: DC
  Administered 2020-05-22: 40 mg via SUBCUTANEOUS
  Filled 2020-05-22: qty 0.4

## 2020-05-22 MED ORDER — SODIUM CHLORIDE 0.9 % IV SOLN: 100.0000 mg | Freq: Every day | INTRAVENOUS | Status: DC

## 2020-05-22 MED ORDER — GUAIFENESIN-DM 100-10 MG/5ML PO SYRP: 10.0000 mL | ORAL_SOLUTION | ORAL | Status: DC | PRN

## 2020-05-22 MED ORDER — METHYLPREDNISOLONE SODIUM SUCC 125 MG IJ SOLR
0.5000 mg/kg | Freq: Two times a day (BID) | INTRAMUSCULAR | Status: DC
  Administered 2020-05-22: 46.25 mg via INTRAVENOUS
  Filled 2020-05-22: qty 2

## 2020-05-22 MED ORDER — SODIUM CHLORIDE 0.9 % IV SOLN
200.0000 mg | Freq: Once | INTRAVENOUS | Status: AC
  Administered 2020-05-22: 200 mg via INTRAVENOUS
  Filled 2020-05-22: qty 200

## 2020-05-22 MED ORDER — SODIUM CHLORIDE 0.9 % IV SOLN: INTRAVENOUS | Status: DC | PRN

## 2020-05-22 MED ORDER — HYDROCOD POLST-CPM POLST ER 10-8 MG/5ML PO SUER
5.0000 mL | Freq: Two times a day (BID) | ORAL | Status: DC | PRN
Start: 2020-05-22 — End: 2020-05-22

## 2020-05-22 MED ORDER — PREDNISONE 10 MG PO TABS: 40.0000 mg | ORAL_TABLET | Freq: Every day | ORAL | 0 refills | Status: AC

## 2020-05-22 MED ORDER — ALBUTEROL SULFATE (2.5 MG/3ML) 0.083% IN NEBU: 2.5000 mg | INHALATION_SOLUTION | Freq: Four times a day (QID) | RESPIRATORY_TRACT | 1 refills | Status: DC | PRN

## 2020-05-22 MED ORDER — ALBUTEROL SULFATE HFA 108 (90 BASE) MCG/ACT IN AERS
2.0000 | INHALATION_SPRAY | RESPIRATORY_TRACT | Status: DC | PRN
  Filled 2020-05-22: qty 6.7

## 2020-05-22 MED ORDER — ASCORBIC ACID 500 MG PO TABS: 500.0000 mg | ORAL_TABLET | Freq: Every day | ORAL | 0 refills | Status: AC

## 2020-05-22 MED ORDER — PREDNISONE 20 MG PO TABS: 50.0000 mg | ORAL_TABLET | Freq: Every day | ORAL | Status: DC

## 2020-05-22 MED ORDER — ALBUTEROL SULFATE HFA 108 (90 BASE) MCG/ACT IN AERS: 2.0000 | INHALATION_SPRAY | Freq: Four times a day (QID) | RESPIRATORY_TRACT | 1 refills | Status: DC | PRN

## 2020-05-22 MED ORDER — DIPHENHYDRAMINE HCL 50 MG/ML IJ SOLN: 50.0000 mg | Freq: Once | INTRAMUSCULAR | Status: DC | PRN

## 2020-05-22 MED ORDER — SODIUM CHLORIDE 0.9 % IV SOLN
Freq: Once | INTRAVENOUS | Status: AC
  Filled 2020-05-22: qty 20

## 2020-05-22 MED ORDER — ASCORBIC ACID 500 MG PO TABS
500.0000 mg | ORAL_TABLET | Freq: Every day | ORAL | Status: DC
  Administered 2020-05-22: 500 mg via ORAL
  Filled 2020-05-22: qty 1

## 2020-05-22 MED ORDER — ZINC SULFATE 220 (50 ZN) MG PO CAPS: 220.0000 mg | ORAL_CAPSULE | Freq: Every day | ORAL | 0 refills | Status: AC

## 2020-05-22 MED ORDER — ALBUTEROL SULFATE HFA 108 (90 BASE) MCG/ACT IN AERS
2.0000 | INHALATION_SPRAY | Freq: Four times a day (QID) | RESPIRATORY_TRACT | Status: DC
  Administered 2020-05-22 (×2): 2 via RESPIRATORY_TRACT
  Filled 2020-05-22: qty 6.7

## 2020-05-22 MED ORDER — IPRATROPIUM-ALBUTEROL 0.5-2.5 (3) MG/3ML IN SOLN
9.0000 mL | Freq: Once | RESPIRATORY_TRACT | Status: AC
Start: 2020-05-22 — End: 2020-05-22
  Administered 2020-05-22: 9 mL via RESPIRATORY_TRACT
  Filled 2020-05-22: qty 9

## 2020-05-22 MED ORDER — METHYLPREDNISOLONE SODIUM SUCC 125 MG IJ SOLR
125.0000 mg | Freq: Once | INTRAMUSCULAR | Status: AC
  Administered 2020-05-22: 125 mg via INTRAVENOUS
  Filled 2020-05-22: qty 2

## 2020-05-22 MED ORDER — METHYLPREDNISOLONE SODIUM SUCC 125 MG IJ SOLR: 125.0000 mg | Freq: Once | INTRAMUSCULAR | Status: DC | PRN

## 2020-05-22 MED ORDER — MAGNESIUM SULFATE 2 GM/50ML IV SOLN
2.0000 g | Freq: Once | INTRAVENOUS | Status: AC
  Administered 2020-05-22: 2 g via INTRAVENOUS
  Filled 2020-05-22: qty 50

## 2020-05-22 MED ORDER — PREDNISONE 10 MG PO TABS: 40.0000 mg | ORAL_TABLET | Freq: Every day | ORAL | 0 refills | Status: DC

## 2020-05-22 NOTE — Progress Notes (Signed)
Remdesivir - Pharmacy Brief Note    A/P:  Remdesivir 200 mg IVPB once followed by 100 mg IVPB daily x 4 days.   Valrie Hart, PharmD Clinical Pharmacist   05/22/2020 2:42 AM

## 2020-05-22 NOTE — Discharge Summary (Addendum)
Physician Discharge Summary  David [name] KAJ:[id number] DOB: 06/01/1965 DOA: 05/21/2020  PCP: Patient, No Pcp Per  Admit date: 05/21/2020 Discharge date: 05/22/2020  Admitted From: Home Disposition: Home  Recommendations for Outpatient Follow-up:  1. Follow up with PCP in 1-2 weeks 2. Please obtain BMP/CBC in one week 3. Please follow up on the following pending results: None  Home Health: No Equipment/Devices: None Discharge Condition: Stable CODE STATUS:  Diet recommendation: Heart Healthy   Brief/Interim Summary: David [name] is a 54 y.o. male with medical history significant for asthma, who presents to the emergency room with shortness of breath and wheezing.  Patient states he ran out of his albuterol.  He denies chest pain, fever or chills and denies nausea vomiting abdominal pain or diarrhea. Patient is fully vaccinated against Covid and received his booster shot. On arrival he was having wheezing and mild hypoxia requiring 1 to 2 L of oxygen initially.  COVID-19 came back positive.  Chest x-ray without any acute changes.  He received DuoNeb, Solu-Medrol and magnesium in ED. He did receive 1 dose of remdesivir in ED. He was given a dose of monoclonal antibodies. Patient was feeling better and wants to go home.  Saturating well on room air with rest and ambulation.  He was given prescriptions for prednisone and albuterol inhaler and will follow up with his primary care provider.  Discharge Diagnoses:  Principal Problem:   Asthma in adult, mild persistent, with acute exacerbation Active Problems:   COVID-19 virus infection   Acute respiratory failure with hypoxia [facility])  Discharge Instructions  Discharge Instructions    Diet - low sodium heart healthy   Complete by: As directed    Discharge instructions   Complete by: As directed    It was pleasure taking care of you. Please continue taking your inhaler as needed. Start taking your prednisone from  tomorrow. Continue taking your supplements for couple of month now. You need to quarantine yourself for at least 10 days. Keep yourself well-hydrated and follow-up with your primary care provider.   Increase activity slowly   Complete by: As directed      Allergies as of 05/22/2020      Reactions   Penicillins    Penicillins Swelling      Medication List    TAKE these medications   albuterol (2.5 MG/3ML) 0.083% nebulizer solution Commonly known as: PROVENTIL Take 3 mLs (2.5 mg total) by nebulization every 6 (six) hours as needed for wheezing or shortness of breath.   albuterol 108 (90 Base) MCG/ACT inhaler Commonly known as: VENTOLIN HFA Inhale 2 puffs into the lungs every 6 (six) hours as needed for wheezing or shortness of breath.   ascorbic acid 500 MG tablet Commonly known as: VITAMIN C Take 1 tablet (500 mg total) by mouth daily. Start taking on: May 23, 2020   guaiFENesin-dextromethorphan 100-10 MG/5ML syrup Commonly known as: ROBITUSSIN DM Take 10 mLs by mouth every 4 (four) hours as needed for cough.   predniSONE 10 MG tablet Commonly known as: DELTASONE Take 4 tablets (40 mg total) by mouth daily for 5 days. What changed: medication strength   zinc sulfate 220 (50 Zn) MG capsule Take 1 capsule (220 mg total) by mouth daily. Start taking on: May 23, 2020       Allergies  Allergen Reactions  . Penicillins   . Penicillins Swelling    Consultations:  None  Procedures/Studies: DG Chest 1 View  Result Date: 05/22/2020 CLINICAL DATA:  Shortness of breath, tachypnea EXAM: CHEST  1 VIEW COMPARISON:  Radiograph 12/09/2018 FINDINGS: No consolidation, features of edema, pneumothorax, or effusion. Pulmonary vascularity is normally distributed. The cardiomediastinal contours are unremarkable. No acute osseous or soft tissue abnormality. Telemetry leads overlie the chest. IMPRESSION: No acute cardiopulmonary abnormality. Electronically Signed   By:  Kreg Shropshire M.D.   On: 05/22/2020 00:20    Subjective: Patient was feeling better when seen during morning rounds.  He was requesting that discharge.  Discussed about getting monoclonal antibody and then going home, patient agreed.  Monoclonal antibodies ordered.  Discharge Exam: Vitals:   05/22/20 1200 05/22/20 1307  BP: (!) 150/98 (!) 156/89  Pulse: 84 74  Resp: 18 20  Temp: 98.6 F (37 C) 98.6 F (37 C)  SpO2: 93% 91%   Vitals:   05/22/20 1102 05/22/20 1120 05/22/20 1200 05/22/20 1307  BP: (!) 151/98  (!) 150/98 (!) 156/89  Pulse: 96 91 84 74  Resp: (!) 21 17 18 20   Temp: 98.5 F (36.9 C)  98.6 F (37 C) 98.6 F (37 C)  TempSrc: Oral  Oral Oral  SpO2: 91% 92% 93% 91%  Weight:      Height:        General: Pt is alert, awake, not in acute distress Cardiovascular: RRR, S1/S2 +, no rubs, no gallops Respiratory: CTA bilaterally, no wheezing, no rhonchi, mildly decreased air entry. Abdominal: Soft, NT, ND, bowel sounds + Extremities: no edema, no cyanosis   The results of significant diagnostics from this hospitalization (including imaging, microbiology, ancillary and laboratory) are listed below for reference.    Microbiology: Recent Results (from the past 240 hour(s))  Resp Panel by RT-PCR (Flu A&B, Covid) Nasopharyngeal Swab     Status: Abnormal   Collection Time: 05/22/20 12:39 AM   Specimen: Nasopharyngeal Swab; Nasopharyngeal(NP) swabs in vial transport medium  Result Value Ref Range Status   SARS Coronavirus 2 by RT PCR POSITIVE (A) NEGATIVE Final    Comment: RESULT CALLED TO, READ BACK BY AND VERIFIED WITH: RACHEL HAYDEN AT 0219 ON 05/22/20 BY SS (NOTE) SARS-CoV-2 target nucleic acids are DETECTED.  The SARS-CoV-2 RNA is generally detectable in upper respiratory specimens during the acute phase of infection. Positive results are indicative of the presence of the identified virus, but do not rule out bacterial infection or co-infection with other pathogens  not detected by the test. Clinical correlation with patient history and other diagnostic information is necessary to determine patient infection status. The expected result is Negative.  Fact Sheet for Patients: 05/24/20  Fact Sheet for Healthcare Providers: BloggerCourse.com  This test is not yet approved or cleared by the [place] FDA and  has been authorized for detection and/or diagnosis of SARS-CoV-2 by FDA under an Emergency Use Authorization (EUA).  This EUA will remain in effect (meaning this test  can be used) for the duration of  the COVID-19 declaration under Section 564(b)(1) of the Act, 21 U.S.C. section 360bbb-3(b)(1), unless the authorization is terminated or revoked sooner.     Influenza A by PCR NEGATIVE NEGATIVE Final   Influenza B by PCR NEGATIVE NEGATIVE Final    Comment: (NOTE) The Xpert Xpress SARS-CoV-2/FLU/RSV plus assay is intended as an aid in the diagnosis of influenza from Nasopharyngeal swab specimens and should not be used as a sole basis for treatment. Nasal washings and aspirates are unacceptable for Xpert Xpress SARS-CoV-2/FLU/RSV testing.  Fact Sheet for Patients: [place]  Fact Sheet for Healthcare Providers: BloggerCourse.com  This test is not yet approved or cleared by the [place] and has been authorized for detection and/or diagnosis of SARS-CoV-2 by FDA under an Emergency Use Authorization (EUA). This EUA will remain in effect (meaning this test can be used) for the duration of the COVID-19 declaration under Section 564(b)(1) of the Act, 21 U.S.C. section 360bbb-3(b)(1), unless the authorization is terminated or revoked.  Performed at [facility], [address] [place], [place] [address]      Labs: BNP (last 3 results) Recent Labs    05/22/20 0039  BNP 33.2   Basic Metabolic  Panel: Recent Labs  Lab 05/22/20 0039  NA 134*  K 3.6  CL 99  CO2 29  GLUCOSE 91  BUN 10  CREATININE 1.24  CALCIUM 8.8*   Liver Function Tests: Recent Labs  Lab 05/22/20 0039  AST 20  ALT 17  ALKPHOS 87  BILITOT 1.6*  PROT 9.4*  ALBUMIN 3.9   No results for input(s): LIPASE, AMYLASE in the last 168 hours. No results for input(s): AMMONIA in the last 168 hours. CBC: Recent Labs  Lab 05/22/20 0039  WBC 8.3  NEUTROABS 5.6  HGB 12.8*  HCT 38.6*  MCV 100.5*  PLT 206   Cardiac Enzymes: No results for input(s): CKTOTAL, CKMB, CKMBINDEX, TROPONINI in the last 168 hours. BNP: Invalid input(s): POCBNP CBG: No results for input(s): GLUCAP in the last 168 hours. D-Dimer No results for input(s): DDIMER in the last 72 hours. Hgb A1c No results for input(s): HGBA1C in the last 72 hours. Lipid Profile Recent Labs    05/22/20 0032  TRIG 92   Thyroid function studies No results for input(s): TSH, T4TOTAL, T3FREE, THYROIDAB in the last 72 hours.  Invalid input(s): FREET3 Anemia work up [employer]    05/22/20 0305  FERRITIN 196   Urinalysis No results found for: COLORURINE, APPEARANCEUR, LABSPEC, PHURINE, GLUCOSEU, HGBUR, BILIRUBINUR, KETONESUR, PROTEINUR, UROBILINOGEN, NITRITE, LEUKOCYTESUR Sepsis Labs Invalid input(s): PROCALCITONIN,  WBC,  LACTICIDVEN Microbiology Recent Results (from the past 240 hour(s))  Resp Panel by RT-PCR (Flu A&B, Covid) Nasopharyngeal Swab     Status: Abnormal   Collection Time: 05/22/20 12:39 AM   Specimen: Nasopharyngeal Swab; Nasopharyngeal(NP) swabs in vial transport medium  Result Value Ref Range Status   SARS Coronavirus 2 by RT PCR POSITIVE (A) NEGATIVE Final    Comment: RESULT CALLED TO, READ BACK BY AND VERIFIED WITH: RACHEL HAYDEN AT 0219 ON 05/22/20 BY SS (NOTE) SARS-CoV-2 target nucleic acids are DETECTED.  The SARS-CoV-2 RNA is generally detectable in upper respiratory specimens during the acute phase of infection.  Positive results are indicative of the presence of the identified virus, but do not rule out bacterial infection or co-infection with other pathogens not detected by the test. Clinical correlation with patient history and other diagnostic information is necessary to determine patient infection status. The expected result is Negative.  Fact Sheet for Patients: BloggerCourse.com  Fact Sheet for Healthcare Providers: SeriousBroker.it  This test is not yet approved or cleared by the [place] FDA and  has been authorized for detection and/or diagnosis of SARS-CoV-2 by FDA under an Emergency Use Authorization (EUA).  This EUA will remain in effect (meaning this test  can be used) for the duration of  the COVID-19 declaration under Section 564(b)(1) of the Act, 21 U.S.C. section 360bbb-3(b)(1), unless the authorization is terminated or revoked sooner.     Influenza A by PCR NEGATIVE NEGATIVE Final   Influenza B by  PCR NEGATIVE NEGATIVE Final    Comment: (NOTE) The Xpert Xpress SARS-CoV-2/FLU/RSV plus assay is intended as an aid in the diagnosis of influenza from Nasopharyngeal swab specimens and should not be used as a sole basis for treatment. Nasal washings and aspirates are unacceptable for Xpert Xpress SARS-CoV-2/FLU/RSV testing.  Fact Sheet for Patients: BloggerCourse.com  Fact Sheet for Healthcare Providers: SeriousBroker.it  This test is not yet approved or cleared by the [place] FDA and has been authorized for detection and/or diagnosis of SARS-CoV-2 by FDA under an Emergency Use Authorization (EUA). This EUA will remain in effect (meaning this test can be used) for the duration of the COVID-19 declaration under Section 564(b)(1) of the Act, 21 U.S.C. section 360bbb-3(b)(1), unless the authorization is terminated or revoked.  Performed at [facility],  [address] [place], [place] [address]     Time coordinating discharge: Over 30 minutes  SIGNED:  Arnetha Courser, MD  [facility] 05/22/2020, 1:09 PM  If 7PM-7AM, please contact night-coverage [email or web address]  This record has been created using Conservation officer, historic buildings. Errors have been sought and corrected,but may not always be located. Such creation errors do not reflect on the standard of care.

## 2020-05-22 NOTE — ED Notes (Signed)
Pt removed [place] oxygen. Pt continues to sat at 92%.

## 2020-05-22 NOTE — ED Notes (Addendum)
Albuterol inhaler administered before discharge per attending request. Electronic prescription to be sent to [facility] in [place], [place].

## 2020-05-22 NOTE — H&P (Signed)
History and Physical    David [name] JFH:[id number] DOB: May 01, 1966 DOA: 05/21/2020  PCP: Patient, No Pcp Per   Patient coming from: Home  I have personally briefly reviewed patient's old medical records in [facility]  Chief Complaint: Shortness of breath, wheezing  HPI: David [name] is a 54 y.o. male with medical history significant for asthma, who presents to the emergency room with shortness of breath and wheezing.  Patient states he ran out of his albuterol.  He denies chest pain, fever or chills and denies nausea vomiting abdominal pain or diarrhea. Patient is fully vaccinated against Covid and received his booster shot.  ED Course: On arrival he was tachypneic speaking in short sentences and wheezing.  Afebrile, BP 152/95, heart rate 118, respirations 24 with O2 sat 92% on room air going as low as 88%.  Blood work for the most part unremarkable.  Covid positive, flu negative. EKG as reviewed by me : Normal sinus rhythm rate of 96 with no acute ST-T wave changes Imaging: Chest x-ray with no acute disease  Patient was treated with duo nebs, Solu-Medrol but continued to have increased work of breathing.  Hospitalist consulted for admission.  Review of Systems: As per HPI otherwise all other systems on review of systems negative.    Past Medical History:  Diagnosis Date  . Asthma     No past surgical history on file.   reports that he has been smoking cigarettes. He has been smoking about 0.25 packs per day. He has never used smokeless tobacco. He reports that he does not drink alcohol and does not use drugs.  Allergies  Allergen Reactions  . Penicillins   . Penicillins Swelling    Family History  Problem Relation Age of Onset  . Cancer Mother       Prior to Admission medications   Medication Sig Start Date End Date Taking? Authorizing Provider  albuterol (PROVENTIL) (2.5 MG/3ML) 0.083% nebulizer solution Take 3 mLs (2.5 mg total) by nebulization  every 6 (six) hours as needed for wheezing or shortness of breath. 05/22/20   Gilles Chiquito, MD  albuterol (VENTOLIN HFA) 108 (90 Base) MCG/ACT inhaler Inhale 2 puffs into the lungs every 6 (six) hours as needed for wheezing or shortness of breath. 05/22/20   Gilles Chiquito, MD  predniSONE (DELTASONE) 10 MG tablet Take 4 tablets (40 mg total) by mouth daily for 4 days. 05/22/20 05/26/20  Gilles Chiquito, MD    Physical Exam: Vitals:   05/22/20 0015 05/22/20 0030 05/22/20 0045 05/22/20 0300  BP:  (!) 143/74  (!) 152/98  Pulse: 93 95 98 (!) 107  Resp: 19 19 (!) 24 (!) 27  SpO2: 94% 100% 91% 94%  Weight:      Height:         Vitals:   05/22/20 0015 05/22/20 0030 05/22/20 0045 05/22/20 0300  BP:  (!) 143/74  (!) 152/98  Pulse: 93 95 98 (!) 107  Resp: 19 19 (!) 24 (!) 27  SpO2: 94% 100% 91% 94%  Weight:      Height:          Constitutional: Alert and oriented x 3 . Increased work of breathing and conversational dyspnea HEENT:      Head: Normocephalic and atraumatic.         Eyes: PERLA, EOMI, Conjunctivae are normal. Sclera is non-icteric.       Mouth/Throat: Mucous membranes are moist.  Neck: Supple with no signs of meningismus. Cardiovascular:  Tachycardic. No murmurs, gallops, or rubs. 2+ symmetrical distal pulses are present . No JVD. No LE edema Respiratory: Respiratory effort increased, coarse breath sounds heard bilaterally Gastrointestinal: Soft, non tender, and non distended with positive bowel sounds.  Genitourinary: No CVA tenderness. Musculoskeletal: Nontender with normal range of motion in all extremities. No cyanosis, or erythema of extremities. Neurologic:  Face is symmetric. Moving all extremities. No gross focal neurologic deficits . Skin: Skin is warm, dry.  No rash or ulcers Psychiatric: Mood and affect are normal    Labs on Admission: I have personally reviewed following labs and imaging studies  CBC: Recent Labs  Lab 05/22/20 0039  WBC 8.3   NEUTROABS 5.6  HGB 12.8*  HCT 38.6*  MCV 100.5*  PLT 206   Basic Metabolic Panel: Recent Labs  Lab 05/22/20 0039  NA 134*  K 3.6  CL 99  CO2 29  GLUCOSE 91  BUN 10  CREATININE 1.24  CALCIUM 8.8*   GFR: Estimated Creatinine Clearance: 76.1 mL/min (by C-G formula based on SCr of 1.24 mg/dL). Liver Function Tests: Recent Labs  Lab 05/22/20 0039  AST 20  ALT 17  ALKPHOS 87  BILITOT 1.6*  PROT 9.4*  ALBUMIN 3.9   No results for input(s): LIPASE, AMYLASE in the last 168 hours. No results for input(s): AMMONIA in the last 168 hours. Coagulation Profile: No results for input(s): INR, PROTIME in the last 168 hours. Cardiac Enzymes: No results for input(s): CKTOTAL, CKMB, CKMBINDEX, TROPONINI in the last 168 hours. BNP (last 3 results) No results for input(s): PROBNP in the last 8760 hours. HbA1C: No results for input(s): HGBA1C in the last 72 hours. CBG: No results for input(s): GLUCAP in the last 168 hours. Lipid Profile: Recent Labs    05/22/20 0032  TRIG 92   Thyroid Function Tests: No results for input(s): TSH, T4TOTAL, FREET4, T3FREE, THYROIDAB in the last 72 hours. Anemia Panel: Recent Labs    05/22/20 0305  FERRITIN 196   Urine analysis: No results found for: COLORURINE, APPEARANCEUR, LABSPEC, PHURINE, GLUCOSEU, HGBUR, BILIRUBINUR, KETONESUR, PROTEINUR, UROBILINOGEN, NITRITE, LEUKOCYTESUR  Radiological Exams on Admission: DG Chest 1 View  Result Date: 05/22/2020 CLINICAL DATA:  Shortness of breath, tachypnea EXAM: CHEST  1 VIEW COMPARISON:  Radiograph 12/09/2018 FINDINGS: No consolidation, features of edema, pneumothorax, or effusion. Pulmonary vascularity is normally distributed. The cardiomediastinal contours are unremarkable. No acute osseous or soft tissue abnormality. Telemetry leads overlie the chest. IMPRESSION: No acute cardiopulmonary abnormality. Electronically Signed   By: Kreg Shropshire M.D.   On: 05/22/2020 00:20      Assessment/Plan 54 year old male with history of asthma, who presents to the emergency room with shortness of breath and wheezing.  O2 sat 88%.  Covid positive.    Asthma in adult, mild persistent, with acute exacerbation -Patient treated with duo nebs and Solu-Medrol as well as IV magnesium in the emergency room -We will continue treatment with scheduled and as needed albuterol inhaler given COVID-19 infection -IV Solu-Medrol    Acute respiratory failure with hypoxia (HCC) -Patient presented with increased work of breathing, tachypnea, speaking in 3 word sentences O2 sat reportedly as low as 88% in the emergency room -Etiology related to asthma, possibly Covid though no abnormal findings on chest x-ray    COVID-19 virus infection without pneumonia -Patient reports being vaccinated x2 and receiving booster shot -Patient received remdesivir in the emergency room, not continued to inpatient pending inflammatory biomarkers as  Covid may be incidental -Albuterol as above, antitussives, vitamins -Consider continuation of remdesivir as initiated in the ER -Follow inflammatory biomarkers, so far LDH and ferritin normal, pending CRP and D-dimer    DVT prophylaxis: Lovenox  Code Status: full code  Family Communication:  none  Disposition Plan: Back to previous home environment Consults called: none Status:.  Observation    Andris Baumann MD [facility]     05/22/2020, 3:15 AM

## 2020-05-22 NOTE — ED Provider Notes (Addendum)
[facility] Emergency Department Provider Note  ____________________________________________   Event Date/Time   First MD Initiated Contact with Patient 05/22/20 0000     (approximate)  I have reviewed the triage vital signs and the nursing notes.   HISTORY  Chief Complaint Shortness of Breath   HPI David [name] is a 54 y.o. male with a past medical history of asthma and remote tobacco abuse who presents for assessment of some shortness of breath wheezing and scratching his throat that began earlier this afternoon. Patient denies any fevers, chills, headache, earache, abdominal pain, back pain, chest pain, nausea, vomiting, diarrhea, dysuria, rash, extremity pain, injury or other acute complaints. He is not currently smoking but does state he is out of his albuterol inhaler or nebulizer.         Past Medical History:  Diagnosis Date  . Asthma     There are no problems to display for this patient.   No past surgical history on file.  Prior to Admission medications   Medication Sig Start Date End Date Taking? Authorizing Provider  albuterol (PROVENTIL) (2.5 MG/3ML) 0.083% nebulizer solution Take 3 mLs (2.5 mg total) by nebulization every 6 (six) hours as needed for wheezing or shortness of breath. 05/22/20   Gilles Chiquito, MD  albuterol (VENTOLIN HFA) 108 (90 Base) MCG/ACT inhaler Inhale 2 puffs into the lungs every 6 (six) hours as needed for wheezing or shortness of breath. 05/22/20   Gilles Chiquito, MD  predniSONE (DELTASONE) 10 MG tablet Take 4 tablets (40 mg total) by mouth daily for 4 days. 05/22/20 05/26/20  Gilles Chiquito, MD    Allergies Penicillins and Penicillins  Family History  Problem Relation Age of Onset  . Cancer Mother     Social History Social History   Tobacco Use  . Smoking status: Current Every Day Smoker    Packs/day: 0.25    Types: Cigarettes  . Smokeless tobacco: Never Used  Vaping Use  . Vaping  Use: Never used  Substance Use Topics  . Alcohol use: No  . Drug use: No    Review of Systems  Review of Systems  Constitutional: Negative for chills and fever.  HENT: Negative for sore throat.   Eyes: Negative for pain.  Respiratory: Positive for cough, shortness of breath and wheezing. Negative for stridor.   Cardiovascular: Negative for chest pain.  Gastrointestinal: Negative for vomiting.  Skin: Negative for rash.  Neurological: Negative for seizures, loss of consciousness and headaches.  Psychiatric/Behavioral: Negative for suicidal ideas.  All other systems reviewed and are negative.     ____________________________________________   PHYSICAL EXAM:  VITAL SIGNS: ED Triage Vitals [05/21/20 2353]  Enc Vitals Group     BP (!) 152/95     Pulse Rate (!) 118     Resp (!) 24     Temp      Temp src      SpO2 92 %     Weight 203 lb (92.1 kg)     Height 6\' 1"  (1.854 m)     Head Circumference      Peak Flow      Pain Score 0     Pain Loc      Pain Edu?      Excl. in GC?    Vitals:   05/22/20 0030 05/22/20 0045  BP: (!) 143/74   Pulse: 95 98  Resp: 19 (!) 24  SpO2: 100% 91%   Physical Exam  Vitals and nursing note reviewed.  Constitutional:      Appearance: He is well-developed and well-nourished.  HENT:     Head: Normocephalic and atraumatic.     Right Ear: External ear normal.     Left Ear: External ear normal.     Nose: Nose normal.  Eyes:     Conjunctiva/sclera: Conjunctivae normal.  Cardiovascular:     Rate and Rhythm: Regular rhythm. Tachycardia present.     Heart sounds: No murmur heard.   Pulmonary:     Effort: Tachypnea and respiratory distress present.     Breath sounds: Decreased air movement present. Examination of the left-upper field reveals wheezing. Examination of the right-middle field reveals wheezing. Examination of the left-middle field reveals wheezing. Examination of the right-lower field reveals wheezing. Examination of the  left-lower field reveals wheezing. Wheezing present.  Abdominal:     Palpations: Abdomen is soft.     Tenderness: There is no abdominal tenderness.  Musculoskeletal:        General: No edema.     Cervical back: Neck supple.     Right lower leg: No edema.     Left lower leg: No edema.  Skin:    General: Skin is warm and dry.     Capillary Refill: Capillary refill takes less than 2 seconds.  Neurological:     Mental Status: He is alert and oriented to person, place, and time.  Psychiatric:        Mood and Affect: Mood and affect and mood normal.     ____________________________________________   LABS (all labs ordered are listed, but only abnormal results are displayed)  Labs Reviewed  CBC WITH DIFFERENTIAL/PLATELET - Abnormal; Notable for the following components:      Result Value   RBC 3.84 (*)    Hemoglobin 12.8 (*)    HCT 38.6 (*)    MCV 100.5 (*)    All other components within normal limits  COMPREHENSIVE METABOLIC PANEL - Abnormal; Notable for the following components:   Sodium 134 (*)    Calcium 8.8 (*)    Total Protein 9.4 (*)    Total Bilirubin 1.6 (*)    All other components within normal limits  RESP PANEL BY RT-PCR (FLU A&B, COVID) ARPGX2  BRAIN NATRIURETIC PEPTIDE  PROCALCITONIN  TROPONIN I (HIGH SENSITIVITY)  TROPONIN I (HIGH SENSITIVITY)   ____________________________________________  EKG  Sinus rhythm with a ventricular of 96, normal axis, unremarkable intervals, no clear evidence of acute ischemia although some artifact in V1 and lead I. ____________________________________________  RADIOLOGY  ED MD interpretation: No focal consolidation, large effusion, pneumothorax, overt edema, or other acute intrathoracic process.  Official radiology report(s): DG Chest 1 View  Result Date: 05/22/2020 CLINICAL DATA:  Shortness of breath, tachypnea EXAM: CHEST  1 VIEW COMPARISON:  Radiograph 12/09/2018 FINDINGS: No consolidation, features of edema,  pneumothorax, or effusion. Pulmonary vascularity is normally distributed. The cardiomediastinal contours are unremarkable. No acute osseous or soft tissue abnormality. Telemetry leads overlie the chest. IMPRESSION: No acute cardiopulmonary abnormality. Electronically Signed   By: Kreg Shropshire M.D.   On: 05/22/2020 00:20    ____________________________________________   PROCEDURES  Procedure(s) performed (including Critical Care):  .1-3 Lead EKG Interpretation Performed by: Gilles Chiquito, MD Authorized by: Gilles Chiquito, MD     Interpretation: normal     ECG rate assessment: tachycardic     Rhythm: sinus rhythm     Ectopy: none     Conduction: normal    .  Critical Care Performed by: Gilles Chiquito, MD Authorized by: Gilles Chiquito, MD   Critical care provider statement:    Critical care time (minutes):  45   Critical care time was exclusive of:  Separately billable procedures and treating other patients   Critical care was necessary to treat or prevent imminent or life-threatening deterioration of the following conditions:  Respiratory failure   Critical care was time spent personally by me on the following activities:  Discussions with consultants, evaluation of patient's response to treatment, examination of patient, ordering and performing treatments and interventions, ordering and review of laboratory studies, ordering and review of radiographic studies, pulse oximetry, re-evaluation of patient's condition, obtaining history from patient or surrogate and review of old charts     ____________________________________________   INITIAL IMPRESSION / ASSESSMENT AND PLAN / ED COURSE       Patient presents with [place] to history exam for assessment of acute onset of shortness of breath cough and wheezing in the setting of being out of his albuterol inhalers at home and no history of asthma. Endorses a scratchy throat but no other sick symptoms.  Impression is acute asthma  exacerbation.  Low suspicion for pneumonia given absence of focal pathology on chest x-ray, fever, elevated white blood cell count or other abnormal findings on physical exam.  No evidence of pneumothorax and while ECG has some nonspecific changes given patient denies any chest pain has a nonelevated troponin of low suspicion for ACS or clinically significant arrhythmia at this time.  No significant electrolyte or metabolic derangements.  CBC shows no evidence of acute anemia or other significant derangements.  Patient given duo nebs and steroids.  On reassessment he felt much improved however his SPO2 had decreased to 89%.  He was placed on 2 L nasal cannula for hypoxic respiratory failure likely related to severe asthma exacerbation.  Covid test is positive.  Imaging has been ordered.  I will plan to admit to medicine service for further evaluation management of Covid and asthma exacerbation with acute hypoxic respiratory failure.  ____________________________________________   FINAL CLINICAL IMPRESSION(S) / ED DIAGNOSES  Final diagnoses:  Exacerbation of asthma, unspecified asthma severity, unspecified whether persistent  Medication refill  Acute respiratory failure with hypoxia (HCC)    Medications  ipratropium-albuterol (DUONEB) 0.5-2.5 (3) MG/3ML nebulizer solution 9 mL (9 mLs Nebulization Given 05/22/20 0017)  methylPREDNISolone sodium succinate (SOLU-MEDROL) 125 mg/2 mL injection 125 mg (125 mg Intravenous Given 05/22/20 0037)  magnesium sulfate IVPB 2 g 50 mL (2 g Intravenous New Bag/Given 05/22/20 0038)     ED Discharge Orders         Ordered    albuterol (PROVENTIL) (2.5 MG/3ML) 0.083% nebulizer solution  Every 6 hours PRN        05/22/20 0041    albuterol (VENTOLIN HFA) 108 (90 Base) MCG/ACT inhaler  Every 6 hours PRN        05/22/20 0041    predniSONE (DELTASONE) 10 MG tablet  Daily        05/22/20 0041           Note:  This document was prepared using Dragon voice  recognition software and may include unintentional dictation errors.   Gilles Chiquito, MD 05/22/20 0200    Gilles Chiquito, MD 05/22/20 [phone]

## 2020-05-22 NOTE — ED Notes (Signed)
Pt ambulated at bedside for 4 min. SPO2 remains 91-93% on RA.

## 2020-05-22 NOTE — Discharge Instructions (Addendum)
Please return immediately to the emergency room if you change your mind about staying for observation and continued treatment as well as oxygen supplementation.  Also please schedule follow-up appoint with your PCP as soon as you are able.

## 2020-05-22 NOTE — ED Notes (Signed)
Pt reading info sheet relating to Mab infusion.

## 2020-05-23 LAB — HIV ANTIBODY (ROUTINE TESTING W REFLEX): HIV Screen 4th Generation wRfx: REACTIVE — AB

## 2020-05-24 ENCOUNTER — Telehealth: Payer: Self-pay | Admitting: Internal Medicine

## 2020-05-25 LAB — HIV-1/2 AB - DIFFERENTIATION
HIV 1 Ab: POSITIVE — AB
HIV 2 Ab: UNDETERMINED — AB

## 2020-05-27 ENCOUNTER — Telehealth (HOSPITAL_COMMUNITY): Payer: Self-pay

## 2020-05-27 ENCOUNTER — Telehealth: Payer: Self-pay | Admitting: *Deleted

## 2020-05-27 NOTE — Telephone Encounter (Signed)
-----   Message from Veryl Speak, FNP sent at 05/26/2020 11:57 AM EST ----- Regarding: New HIV David [name] has tested positive for HIV-1. I am not sure he is aware of his diagnosis. Will need to refer to [facility] and attempt to link to care.   Thanks!

## 2020-05-27 NOTE — Telephone Encounter (Signed)
Faxed referral to [facility]. Andree Coss, RN

## 2020-05-31 NOTE — Telephone Encounter (Signed)
See comment

## 2020-06-10 ENCOUNTER — Ambulatory Visit (INDEPENDENT_AMBULATORY_CARE_PROVIDER_SITE_OTHER): Payer: Commercial Managed Care - PPO | Admitting: Infectious Diseases

## 2020-06-10 ENCOUNTER — Encounter: Payer: Self-pay | Admitting: Infectious Diseases

## 2020-06-10 ENCOUNTER — Other Ambulatory Visit (HOSPITAL_COMMUNITY)
Admission: RE | Admit: 2020-06-10 | Discharge: 2020-06-10 | Disposition: A | Discharge status: Home / Self Care | Payer: PRIVATE HEALTH INSURANCE | Source: Ambulatory Visit | Attending: Infectious Diseases | Admitting: Infectious Diseases

## 2020-06-10 ENCOUNTER — Ambulatory Visit: Payer: Commercial Managed Care - PPO

## 2020-06-10 ENCOUNTER — Other Ambulatory Visit: Payer: Self-pay

## 2020-06-10 VITALS — BP 152/103 | HR 103 | Wt 211.8 lb

## 2020-06-10 DIAGNOSIS — Z113 Encounter for screening for infections with a predominantly sexual mode of transmission: Secondary | ICD-10-CM | POA: Diagnosis not present

## 2020-06-10 DIAGNOSIS — B20 Human immunodeficiency virus [HIV] disease: Secondary | ICD-10-CM | POA: Insufficient documentation

## 2020-06-10 DIAGNOSIS — U071 COVID-19: Secondary | ICD-10-CM

## 2020-06-10 DIAGNOSIS — Z7185 Encounter for immunization safety counseling: Secondary | ICD-10-CM | POA: Diagnosis not present

## 2020-06-10 MED ORDER — BICTEGRAVIR-EMTRICITAB-TENOFOV 50-200-25 MG PO TABS: 1.0000 | ORAL_TABLET | Freq: Every day | ORAL | 3 refills | Status: DC

## 2020-06-10 NOTE — Assessment & Plan Note (Signed)
RPR and Urine GC

## 2020-06-10 NOTE — Assessment & Plan Note (Signed)
Asymptomatic  S/p Remdesevir and mab

## 2020-06-10 NOTE — Assessment & Plan Note (Signed)
Start [employer] Intake labs Meet with Pharmacy Fu in 4 weeks for review of lab results

## 2020-06-10 NOTE — Progress Notes (Signed)
[place], [place], [place], [address]                                                                  Phn. [phone]; Fax: [phone]                                                                             Date: 06/10/20  Reason for Visit: HIV Rapid Start     HPI: David [name] is a 55 y.o.old male with a history of recent HIV diagnosis who is here for rapid start of ART.  Of note, he was recently seen in the hospital on 12/25 for acute asthma exacerbation. He was also found to be positive for COVID for which he received one dose of Remdesevir and mab therapy. Chest xray was unremarkable. He was also tested for HIV at that visit and was found to be positive incidentally. He is very unhappy about being tested for HIV without his permission.   He says he has been celebate for approx 6 years and last sexual activity was 6 years ago with a male partner. He says he has been in relationship with that partner for 20 years ago and he was HIV positive. He is a MSM.   He says he has been vaccinated for covid along with booster  He feels well today and has no complaints.   PMH - asthma Meds- inhaler, prednisone  Sx - no  Allergies:  Penicillin: closes throat, happened years ago  Social - Ex smoker,  quit 10 years ago, alcdenies ohol and drugs He works in a cigarette company. He lives by himself Born in [place], family is from [place]. He moved to [facility] to take care of his ailingn mother  In 2008-08-05 ( deceased from lung ca)   ROS: Denies dysphagia, odynophagia, cough, fever, nausea, vomiting, diarrhea, constipation, weight loss, chills, night sweats, recent hospitalizations, rashes, joint complaints, shortness of breath, headaches, chest pain, abdominal pain, dysuria .  Current  Outpatient Medications on File Prior to Visit  Medication Sig Dispense Refill  . albuterol (PROVENTIL) (2.5 MG/3ML) 0.083% nebulizer solution Take 3 mLs (2.5 mg total) by nebulization every 6 (six) hours as needed for wheezing or shortness of breath. 75 mL 1  . albuterol (VENTOLIN HFA) 108 (90 Base) MCG/ACT inhaler Inhale 2 puffs into the lungs every 6 (six) hours as needed for wheezing or shortness of breath. 6.7 g 1  . ascorbic acid (VITAMIN C) 500 MG tablet Take 1 tablet (500 mg total) by mouth daily. 90 tablet 0  . guaiFENesin-dextromethorphan (ROBITUSSIN DM) 100-10 MG/5ML syrup Take 10 mLs by  mouth every 4 (four) hours as needed for cough. 118 mL 0  . zinc sulfate 220 (50 Zn) MG capsule Take 1 capsule (220 mg total) by mouth daily. 90 capsule 0   No current facility-administered medications on file prior to visit.     Allergies  Allergen Reactions  . Penicillins   . Penicillins Swelling   Past Medical History:  Diagnosis Date  . Asthma    Social History   Socioeconomic History  . Marital status: Single    Spouse name: Not on file  . Number of children: Not on file  . Years of education: Not on file  . Highest education level: Not on file  Occupational History  . Not on file  Tobacco Use  . Smoking status: Former Smoker    Packs/day: 0.25    Types: Cigarettes  . Smokeless tobacco: Never Used  Vaping Use  . Vaping Use: Never used  Substance and Sexual Activity  . Alcohol use: No  . Drug use: No  . Sexual activity: Not on file  Other Topics Concern  . Not on file  Social History Narrative   ** Merged History Encounter **       Social Determinants of Health   Financial Resource Strain: Not on file  Food Insecurity: Not on file  Transportation Needs: Not on file  Physical Activity: Not on file  Stress: Not on file  Social Connections: Not on file  Intimate Partner Violence: Not on file   Vitals  BP (!) 152/103   Pulse (!) 103   Wt 211 lb 12.8 oz (96.1 kg)    BMI 27.94 kg/m    Examination  Gen: Alert and oriented x 3, no acute distress HEENT: [place]/AT, PERL,no scleral icterus, no pale conjunctivae, hearing normal, oral mucosa moist, NO OROPHARYNGEAL CANDIDIASIS Neck: Supple, no lymphadenopathy Cardio: Regular rate and rhythm; +S1 and S2; no murmurs, gallops, or rubs Resp: CTAB; no wheezes, rhonchi, or rales GI: Soft, nontender, nondistended, bowel sounds present GU: Musc: Extremities: No cyanosis, clubbing, or edema; +2 PT and DP pulses Skin: No rashes, lesions, or ecchymoses Neuro: No focal deficits Psych: Calm, cooperative  Lab Results No results found for: HIV1RNAQUANT, HIV1RNAVL, CD4TABS No results found for: HIV1GENOSEQ Lab Results  Component Value Date   WBC 8.3 05/22/2020   HGB 12.8 (L) 05/22/2020   HCT 38.6 (L) 05/22/2020   MCV 100.5 (H) 05/22/2020   PLT 206 05/22/2020    Lab Results  Component Value Date   CREATININE 1.24 05/22/2020   BUN 10 05/22/2020   NA 134 (L) 05/22/2020   K 3.6 05/22/2020   CL 99 05/22/2020   CO2 29 05/22/2020   Lab Results  Component Value Date   ALT 17 05/22/2020   AST 20 05/22/2020   ALKPHOS 87 05/22/2020   BILITOT 1.6 (H) 05/22/2020    Lab Results  Component Value Date   TRIG 92 05/22/2020   No results found for: HAV No results found for: HEPBSAG, HEPBSAB No results found for: HCVAB No results found for: CHLAMYDIAWP, N No results found for: GCPROBEAPT No results found for: QUANTGOLD   Assessment/Plan: HIV, Treatment Naive in a MSM Discussed with patient treatment options and side effects, benefits of treatment, long term outcomes.  Discussed the severity of untreated HIV including higher cancer risk, opportunistic infections, renal failure.  Discussed needing to use condoms, partner disclosure, necessary vaccines, blood monitoring.      Start Biktarvy PO daily HIV intake labs Orders Placed This  Encounter  Procedures  . Lipid panel  . T-helper cell (CD4)- ([facility] clinic  only)  . HIV-1 RNA ultraquant reflex to gentyp+  . RPR  . Urinalysis, Routine w reflex microscopic  . QuantiFERON-TB Gold Plus  . Hepatitis A antibody, total  . Hepatitis B core antibody, total  . Hepatitis B surface antigen  . Hepatitis B surface antibody,qualitative  . Hepatitis C antibody  . HLA B*5701    STD Screening  RPR and Urine GC  Immunization Discussed recommended vaccines in [place]  I have personally spent 60  minutes involved in face-to-face and non-face-to-face activities for this patient on the day of the visit. Professional time spent includes the following activities, in addition to those noted in the documentation: Preparing to see the patient (review of tests), Obtaining and/or reviewing separately obtained history (admission/discharge record), Performing a medically appropriate examination and/or evaluation , Ordering medications/tests/procedures,  Documenting clinical information in the EMR or other health record, Independently interpreting results (not separately reported), Communicating results to the patient/family/caregiver, Counseling and educating the patient/family/caregiver and Care coordination (not separately reported).    Patient's labs were reviewed as well as his previous records. Patients questions were addressed and answered. Safe sex counseling done.   Electronically signed by:  Rosiland Oz, MD Infectious Disease Physician [facility] [address] [place], [place] [address] Phone: [phone]  Fax: [phone]

## 2020-06-10 NOTE — Assessment & Plan Note (Signed)
Discussed recommended vaccines in [facility]

## 2020-06-11 ENCOUNTER — Telehealth: Payer: Self-pay | Admitting: Pharmacist

## 2020-06-11 ENCOUNTER — Telehealth: Payer: Self-pay

## 2020-06-11 LAB — URINALYSIS, ROUTINE W REFLEX MICROSCOPIC
Bacteria, UA: NONE SEEN /HPF
Bilirubin Urine: NEGATIVE
Glucose, UA: NEGATIVE
Hgb urine dipstick: NEGATIVE
Hyaline Cast: NONE SEEN /LPF
Nitrite: NEGATIVE
Protein, ur: NEGATIVE
Specific Gravity, Urine: 1.024 (ref 1.001–1.03)
pH: 6 (ref 5.0–8.0)

## 2020-06-11 LAB — URINE CYTOLOGY ANCILLARY ONLY
Chlamydia: NEGATIVE
Comment: NEGATIVE
Comment: NORMAL
Neisseria Gonorrhea: NEGATIVE

## 2020-06-11 LAB — T-HELPER CELL (CD4) - (RCID CLINIC ONLY)
CD4 % Helper T Cell: 29 % — ABNORMAL LOW (ref 33–65)
CD4 T Cell Abs: 695 /uL (ref 400–1790)

## 2020-06-11 MED ORDER — DOXYCYCLINE HYCLATE 100 MG PO TABS: 100.0000 mg | ORAL_TABLET | Freq: Two times a day (BID) | ORAL | 0 refills | Status: AC

## 2020-06-11 NOTE — Telephone Encounter (Signed)
Patient came in for rapid start yesterday but left without Biktarvy samples. Provided samples and counseled on medication.   Drug name: Biktarvy        Strength: 50/200/25 mg       Qty: 28 tablets; 4 bottles   LOT: [id number]   Exp.Date: 07/18/22  Dosing instructions: Take one tablet by mouth once daily  The patient has been instructed regarding the correct time, dose, and frequency of taking this medication, including desired effects and most common side effects.   [name] L. Jannette Fogo, PharmD, BCIDP, AAHIVP, CPP Clinical Pharmacist Practitioner Infectious Diseases Clinical Pharmacist [facility] 05/10/2020, 10:07 AM

## 2020-06-11 NOTE — Addendum Note (Signed)
Addended by: Valarie Cones on: 06/11/2020 03:36 PM   Modules accepted: Orders

## 2020-06-11 NOTE — Telephone Encounter (Signed)
-----   Message from Odette Fraction, MD sent at 06/11/2020  1:03 PM EST ----- Could you please check with the patient if he had a h/o syphilis in the past ? He has tested positive for syphilis and will need a treatment for it.

## 2020-06-11 NOTE — Telephone Encounter (Signed)
Contacted patient to inform of most recent lab results returned positive for syphilis. Patient denies any h/o syphilis in the past. Patient also reports that he has a penicillin allergy. Routing to MD for treatment options. [facility].  Valarie Cones

## 2020-06-11 NOTE — Telephone Encounter (Signed)
Doxycycline 100mg  PO BID for 4 weeks

## 2020-06-11 NOTE — Telephone Encounter (Signed)
Patient made aware of Doxycycline 100mg   sent to pharmacy. Patient verbalized understanding to take full 28 day course, abstain from sexual contact 10 day post treatment. Advised he can make partners aware for testing and treatment at the local health department.   patient verbalized understanding.   

## 2020-06-19 LAB — QUANTIFERON-TB GOLD PLUS
Mitogen-NIL: >10 IU/mL
NIL: 0.03 IU/mL
QuantiFERON-TB Gold Plus: NEGATIVE
TB1-NIL: 0 IU/mL
TB2-NIL: 0.12 IU/mL

## 2020-06-19 LAB — FLUORESCENT TREPONEMAL AB(FTA)-IGG-BLD: Fluorescent Treponemal ABS: REACTIVE — AB

## 2020-06-19 LAB — LIPID PANEL
Cholesterol: 172 mg/dL (ref <200)
HDL: 41 mg/dL (ref >=40)
LDL Cholesterol (Calc): 111 mg/dL (calc) — ABNORMAL HIGH
Non-HDL Cholesterol (Calc): 131 mg/dL (calc) — ABNORMAL HIGH (ref <130)
Total CHOL/HDL Ratio: 4.2 (calc) (ref <5.0)
Triglycerides: 92 mg/dL (ref <150)

## 2020-06-19 LAB — HEPATITIS B CORE ANTIBODY, TOTAL: Hep B Core Total Ab: REACTIVE — AB

## 2020-06-19 LAB — RPR TITER: RPR Titer: 1:128 {titer} — ABNORMAL HIGH

## 2020-06-19 LAB — HLA B*5701: HLA-B*5701 w/rflx HLA-B High: NEGATIVE

## 2020-06-19 LAB — HEPATITIS A ANTIBODY, TOTAL: Hepatitis A AB,Total: NONREACTIVE

## 2020-06-19 LAB — RPR: RPR Ser Ql: REACTIVE — AB

## 2020-06-19 LAB — HIV-1 RNA ULTRAQUANT REFLEX TO GENTYP+
HIV 1 RNA Quant: 100 copies/mL — ABNORMAL HIGH
HIV-1 RNA Quant, Log: 2 Log copies/mL — ABNORMAL HIGH

## 2020-06-19 LAB — HEPATITIS B SURFACE ANTIGEN: Hepatitis B Surface Ag: NONREACTIVE

## 2020-06-19 LAB — HEPATITIS C ANTIBODY
Hepatitis C Ab: NONREACTIVE
SIGNAL TO CUT-OFF: 0.44 (ref <1.00)

## 2020-06-19 LAB — HEPATITIS B SURFACE ANTIBODY,QUALITATIVE: Hep B S Ab: NONREACTIVE

## 2020-06-30 ENCOUNTER — Telehealth: Payer: Self-pay

## 2020-06-30 NOTE — Telephone Encounter (Signed)
[facility] Patient Product/process development scientist completed.    The patient is insured through [facility] and has a 864.75 copay.  We can sign the patient up for PAF to help him with his copay , Just will need HHS & Income.  We will continue to follow to see if copay assistance is needed.  Clearance Coots, CPhT Specialty Pharmacy Patient [facility] Phone: [phone] Fax:  [phone]

## 2020-07-01 ENCOUNTER — Encounter: Payer: Self-pay | Admitting: Infectious Diseases

## 2020-07-09 ENCOUNTER — Telehealth: Payer: Self-pay

## 2020-07-09 ENCOUNTER — Ambulatory Visit (INDEPENDENT_AMBULATORY_CARE_PROVIDER_SITE_OTHER): Payer: Commercial Managed Care - PPO | Admitting: Infectious Diseases

## 2020-07-09 ENCOUNTER — Other Ambulatory Visit: Payer: Self-pay | Admitting: Infectious Diseases

## 2020-07-09 ENCOUNTER — Encounter: Payer: Self-pay | Admitting: Infectious Diseases

## 2020-07-09 ENCOUNTER — Other Ambulatory Visit: Payer: Self-pay

## 2020-07-09 VITALS — BP 162/106 | HR 91 | Temp 98.2°F | Ht 73.0 in | Wt 219.0 lb

## 2020-07-09 DIAGNOSIS — A539 Syphilis, unspecified: Secondary | ICD-10-CM

## 2020-07-09 DIAGNOSIS — B1910 Unspecified viral hepatitis B without hepatic coma: Secondary | ICD-10-CM | POA: Diagnosis not present

## 2020-07-09 DIAGNOSIS — B20 Human immunodeficiency virus [HIV] disease: Secondary | ICD-10-CM | POA: Diagnosis not present

## 2020-07-09 DIAGNOSIS — Z7185 Encounter for immunization safety counseling: Secondary | ICD-10-CM | POA: Diagnosis not present

## 2020-07-09 DIAGNOSIS — Z23 Encounter for immunization: Secondary | ICD-10-CM | POA: Diagnosis not present

## 2020-07-09 MED ORDER — BICTEGRAVIR-EMTRICITAB-TENOFOV 50-200-25 MG PO TABS: 1.0000 | ORAL_TABLET | Freq: Every day | ORAL | 3 refills | Status: DC

## 2020-07-09 NOTE — Telephone Encounter (Signed)
[facility] Patient Advocate Encounter  I gve patient 1 bottle (7 day) Biktarvy sample until receive his mail order.  Clearance Coots, CPhT Specialty Pharmacy Patient [facility] Phone: [phone] Fax:  [phone]

## 2020-07-09 NOTE — Telephone Encounter (Signed)
Patient in clinic today, per Lupita Leash, [facility] is having trouble processing the [facility]'s financial assistance. RN will send Biktarvy to [facility]. Patient aware that our pharmacy team is setting up home delivery for him with [facility] long outpatient pharmacy.  [facility] to [facility] [place] prescription on file there.   Sandie Ano, RN

## 2020-07-09 NOTE — Telephone Encounter (Signed)
[facility] Patient Advocate Encounter   I was successful in securing patient a $7500 grant from [employer] (PAF) to provide copayment coverage for Biktarvy.  This will make the out of pocket cost $0.00.     I have spoken with the patient.              Patient knows to call the office with questions or concerns.  Cheo Selvey, CPhT Specialty Pharmacy Patient Advocate [facility] Phone: [phone] Fax:  [phone]  

## 2020-07-09 NOTE — Progress Notes (Signed)
[address], [place], [place], [address]                                                                  Phn. [phone]; Fax: [phone]                                                                             Date: 07/09/20  Reason for Visit: HIV follow up   HPI: David [name] is a 55 y.o.old male with a history of HIV and late latent syphilis who is here for HIV follow up. Says he has been taking Biktarvy daily. Denies missed doses. Denies any side effects. He is also taking Doxycycline as instructed for late latent syphilis. Discussed with him regarding blood test being positive for Hep C antibody and need for further labs.   He says he VAPed n Monday, denies  smoking and alcohol.  Not sexually active. Refused STD screening  Looking for a new job as he does not enjoys his current job Says got his Flu vaccine and would like to get PCV 13 vaccine today.  Feels well and no complaints today   ROS: Denies dysphagia, odynophagia, cough, fever, nausea, vomiting, diarrhea, constipation, weight loss, chills, night sweats, recent hospitalizations, rashes, joint complaints, shortness of breath, headaches, chest pain, abdominal pain, dysuria .  Current Outpatient Medications on File Prior to Visit  Medication Sig Dispense Refill  . albuterol (PROVENTIL) (2.5 MG/3ML) 0.083% nebulizer solution Take 3 mLs (2.5 mg total) by nebulization every 6 (six) hours as needed for wheezing or shortness of breath. 75 mL 1  . albuterol (VENTOLIN HFA) 108 (90 Base) MCG/ACT inhaler Inhale 2 puffs into the lungs every 6 (six) hours as needed for wheezing or shortness of breath. 6.7 g 1  . ascorbic acid (VITAMIN C) 500 MG tablet Take 1 tablet (500 mg total) by mouth daily. 90 tablet 0  . doxycycline  (VIBRA-TABS) 100 MG tablet Take 1 tablet (100 mg total) by mouth 2 (two) times daily for 28 days. 56 tablet 0  . zinc sulfate 220 (50 Zn) MG capsule Take 1 capsule (220 mg total) by mouth daily. 90 capsule 0  . bictegravir-emtricitabine-tenofovir AF (BIKTARVY) 50-200-25 MG TABS tablet Take 1 tablet by mouth daily. 30 tablet 3  . guaiFENesin-dextromethorphan (ROBITUSSIN DM) 100-10 MG/5ML syrup Take 10 mLs by mouth every 4 (four) hours as needed for cough. (Patient not taking: Reported on 07/09/2020) 118 mL 0   No current facility-administered medications on file prior to visit.     Allergies  Allergen Reactions  . Penicillins   . Penicillins Swelling   Past  Medical History:  Diagnosis Date  . Asthma    Social History   Socioeconomic History  . Marital status: Single    Spouse name: Not on file  . Number of children: Not on file  . Years of education: Not on file  . Highest education level: Not on file  Occupational History  . Not on file  Tobacco Use  . Smoking status: Former Smoker    Packs/day: 0.25    Types: Cigarettes  . Smokeless tobacco: Never Used  Vaping Use  . Vaping Use: Never used  Substance and Sexual Activity  . Alcohol use: No  . Drug use: No  . Sexual activity: Not on file  Other Topics Concern  . Not on file  Social History Narrative   ** Merged History Encounter **       Social Determinants of Health   Financial Resource Strain: Not on file  Food Insecurity: Not on file  Transportation Needs: Not on file  Physical Activity: Not on file  Stress: Not on file  Social Connections: Not on file  Intimate Partner Violence: Not on file   Vitals BP (!) 162/106   Pulse 91   Temp 98.2 F (36.8 C) (Oral)   Ht 6\' 1"  (1.854 m)   Wt 219 lb (99.3 kg)   SpO2 97%   BMI 28.89 kg/m     Examination  Gen: Alert and oriented x 3, no acute distress HEENT: [place]/AT, PERL, EOMI, no scleral icterus, no pale conjunctivae, hearing normal, oral mucosa moist, no oral  thrush  Neck: Supple, no lymphadenopathy Cardio: Regular rate and rhythm; +S1 and S2; no murmurs, gallops, or rubs Resp: CTAB; no wheezes, rhonchi, or rales GI: Soft, nontender, nondistended, bowel sounds present GU: Musc: Extremities: No cyanosis, clubbing, or edema; +2 PT and DP pulses Skin: No rashes, lesions, or ecchymoses Neuro: No focal deficits Psych: Calm, cooperative    Lab Results HIV 1 RNA Quant (copies/mL)  Date Value  06/10/2020 100 (H)   CD4 T Cell Abs (/uL)  Date Value  06/10/2020 695   No results found for: HIV1GENOSEQ Lab Results  Component Value Date   WBC 8.3 05/22/2020   HGB 12.8 (L) 05/22/2020   HCT 38.6 (L) 05/22/2020   MCV 100.5 (H) 05/22/2020   PLT 206 05/22/2020    Lab Results  Component Value Date   CREATININE 1.24 05/22/2020   BUN 10 05/22/2020   NA 134 (L) 05/22/2020   K 3.6 05/22/2020   CL 99 05/22/2020   CO2 29 05/22/2020   Lab Results  Component Value Date   ALT 17 05/22/2020   AST 20 05/22/2020   ALKPHOS 87 05/22/2020   BILITOT 1.6 (H) 05/22/2020    Lab Results  Component Value Date   CHOL 172 06/10/2020   TRIG 92 06/10/2020   HDL 41 06/10/2020   LDLCALC 111 (H) 06/10/2020   Lab Results  Component Value Date   HAV NON-REACTIVE 06/10/2020   Lab Results  Component Value Date   HEPBSAG NON-REACTIVE 06/10/2020   HEPBSAB NON-REACTIVE 06/10/2020   No results found for: HCVAB Lab Results  Component Value Date   CHLAMYDIAWP Negative 06/10/2020   N Negative 06/10/2020    Health Maintenance: Immunization History  Administered Date(s) Administered  . PFIZER(Purple Top)SARS-COV-2 Vaccination 08/01/2019, 08/21/2019, 04/06/2020    Assessment/Plan: HIV Continue Biktarvy daily Fu in 2 months  Orders Placed This Encounter  Procedures  . Pneumococcal conjugate vaccine 13-valent IM  . HIV-1 RNA ultraquant reflex  to gentyp+  . RPR  . Hepatitis B DNA, ultraquantitative, PCR  . Hepatitis B e antigen  . Hepatitis B e  antibody   Late latent Syphilis On Doxycycline, finishing up 28 days course soon  Hep B Core Antibody Total Reactive Will get Hep B e antigen , e antibody and hep B DNA  STD Screening  Not currently sexually active, refused screening   Immunization PCV 13 vaccine today   I have personally spent 30  minutes involved in face-to-face and non-face-to-face activities for this patient on the day of the visit.   Patient's labs were reviewed as well as his previous records. Patients questions were addressed and answered. Safe sex counseling done.   Electronically signed by:  Odette Fraction, MD Infectious Disease Physician [facility] for Infectious Disease [address] [place], [place] [address] Phone: [phone]  Fax: [phone]

## 2020-07-13 LAB — HIV-1 RNA ULTRAQUANT REFLEX TO GENTYP+
HIV 1 RNA Quant: <20 copies/mL
HIV-1 RNA Quant, Log: <1.3 Log copies/mL

## 2020-07-13 LAB — RPR: RPR Ser Ql: REACTIVE — AB

## 2020-07-13 LAB — HEPATITIS B DNA, ULTRAQUANTITATIVE, PCR
Hepatitis B DNA (Calc): <1 Log IU/mL
Hepatitis B DNA: <10 IU/mL

## 2020-07-13 LAB — HEPATITIS B E ANTIBODY: Hep B E Ab: NONREACTIVE

## 2020-07-13 LAB — RPR TITER: RPR Titer: 1:128 {titer} — ABNORMAL HIGH

## 2020-07-13 LAB — FLUORESCENT TREPONEMAL AB(FTA)-IGG-BLD: Fluorescent Treponemal ABS: REACTIVE — AB

## 2020-07-13 LAB — HEPATITIS B E ANTIGEN: Hep B E Ag: NONREACTIVE

## 2020-07-16 ENCOUNTER — Other Ambulatory Visit: Payer: Self-pay | Admitting: Infectious Diseases

## 2020-08-27 ENCOUNTER — Other Ambulatory Visit: Payer: Self-pay | Admitting: Infectious Diseases

## 2020-08-27 ENCOUNTER — Encounter: Payer: Self-pay | Admitting: Infectious Diseases

## 2020-08-27 ENCOUNTER — Ambulatory Visit (INDEPENDENT_AMBULATORY_CARE_PROVIDER_SITE_OTHER): Payer: Commercial Managed Care - PPO | Admitting: Infectious Diseases

## 2020-08-27 ENCOUNTER — Other Ambulatory Visit: Payer: Self-pay

## 2020-08-27 VITALS — Wt 214.0 lb

## 2020-08-27 DIAGNOSIS — Z113 Encounter for screening for infections with a predominantly sexual mode of transmission: Secondary | ICD-10-CM

## 2020-08-27 DIAGNOSIS — Z7185 Encounter for immunization safety counseling: Secondary | ICD-10-CM

## 2020-08-27 DIAGNOSIS — B20 Human immunodeficiency virus [HIV] disease: Secondary | ICD-10-CM

## 2020-08-27 MED ORDER — BICTEGRAVIR-EMTRICITAB-TENOFOV 50-200-25 MG PO TABS: 1.0000 | ORAL_TABLET | Freq: Every day | ORAL | 3 refills | Status: DC

## 2020-08-27 NOTE — Progress Notes (Signed)
[address], [place], [place], [address]                                                                  Phn. [phone]; Fax: [phone]                                                                             Date: 08/27/20  Reason for Visit: HIV follow up   HPI: David [name] is a 55 y.o.old male with a history of HIV and late latent syphilis who is here for HIV follow up. He has completed 28 days of doxycyline for late latent syphilis. Taking Biktarvy without missing any doses. Denies any side effects. Has started a new job at [employer]. He is aso taking care of a family member who recently had a foot amputation and has been very busy. Denies being sexually active. Denies smoking, alcohol and using illicit drugs. He is seeing a dentist for dental pain soon. Refused getting vaccines today.   Denies feeling sad, depressed or hopeless. No acute complaints today.  ROS: Denies dysphagia, odynophagia, cough, fever, nausea, vomiting, diarrhea, constipation, weight loss, chills, night sweats, recent hospitalizations, rashes, joint complaints, shortness of breath, headaches, chest pain, abdominal pain, dysuria .  Current Outpatient Medications on File Prior to Visit  Medication Sig Dispense Refill  . bictegravir-emtricitabine-tenofovir AF (BIKTARVY) 50-200-25 MG TABS tablet Take 1 tablet by mouth daily. 30 tablet 3  . albuterol (PROVENTIL) (2.5 MG/3ML) 0.083% nebulizer solution Take 3 mLs (2.5 mg total) by nebulization every 6 (six) hours as needed for wheezing or shortness of breath. 75 mL 1  . albuterol (VENTOLIN HFA) 108 (90 Base) MCG/ACT inhaler Inhale 2 puffs into the lungs every 6 (six) hours as needed for wheezing or shortness of breath. 6.7 g 1  . ascorbic acid (VITAMIN C) 500 MG  tablet Take 1 tablet (500 mg total) by mouth daily. 90 tablet 0  . bictegravir-emtricitabine-tenofovir AF (BIKTARVY) 50-200-25 MG TABS tablet Take 1 tablet by mouth daily. 30 tablet 3  . guaiFENesin-dextromethorphan (ROBITUSSIN DM) 100-10 MG/5ML syrup Take 10 mLs by mouth every 4 (four) hours as needed for cough. (Patient not taking: Reported on 07/09/2020) 118 mL 0  . zinc sulfate 220 (50 Zn) MG capsule Take 1 capsule (220 mg total) by mouth daily. 90 capsule 0   No current facility-administered medications on file prior to visit.     Allergies  Allergen Reactions  . Penicillins   . Penicillins Swelling   Past Medical History:  Diagnosis Date  . Asthma    Social History   Socioeconomic History  . Marital status:  Single    Spouse name: Not on file  . Number of children: Not on file  . Years of education: Not on file  . Highest education level: Not on file  Occupational History  . Not on file  Tobacco Use  . Smoking status: Former Smoker    Packs/day: 0.25    Types: Cigarettes  . Smokeless tobacco: Never Used  Vaping Use  . Vaping Use: Never used  Substance and Sexual Activity  . Alcohol use: No  . Drug use: No  . Sexual activity: Not on file  Other Topics Concern  . Not on file  Social History Narrative   ** Merged History Encounter **       Social Determinants of Health   Financial Resource Strain: Not on file  Food Insecurity: Not on file  Transportation Needs: Not on file  Physical Activity: Not on file  Stress: Not on file  Social Connections: Not on file  Intimate Partner Violence: Not on file   Vitals Wt 214 lb (97.1 kg)   BMI 28.23 kg/m     Examination  Gen: Alert and oriented x 3, no acute distress HEENT: [place]/AT, PERL, no scleral icterus, no pale conjunctivae, hearing normal, oral mucosa moist, no oral thrush  Neck: Supple, no lymphadenopathy Cardio: Regular rate and rhythm; +S1 and S2; no murmurs, gallops, or rubs Resp: CTAB; no wheezes,  rhonchi, or rales GI: Soft, nontender, nondistended, bowel sounds present GU: Musc: Extremities: No cyanosis, clubbing, or edema; +2 PT and DP pulses Skin: No rashes, lesions, or ecchymoses Neuro: No focal deficits Psych: Calm, cooperative   Lab Results HIV 1 RNA Quant (copies/mL)  Date Value  07/09/2020 <20 NOT DETECTED  06/10/2020 100 (H)   CD4 T Cell Abs (/uL)  Date Value  06/10/2020 695   No results found for: HIV1GENOSEQ Lab Results  Component Value Date   WBC 8.3 05/22/2020   HGB 12.8 (L) 05/22/2020   HCT 38.6 (L) 05/22/2020   MCV 100.5 (H) 05/22/2020   PLT 206 05/22/2020    Lab Results  Component Value Date   CREATININE 1.24 05/22/2020   BUN 10 05/22/2020   NA 134 (L) 05/22/2020   K 3.6 05/22/2020   CL 99 05/22/2020   CO2 29 05/22/2020   Lab Results  Component Value Date   ALT 17 05/22/2020   AST 20 05/22/2020   ALKPHOS 87 05/22/2020   BILITOT 1.6 (H) 05/22/2020    Lab Results  Component Value Date   CHOL 172 06/10/2020   TRIG 92 06/10/2020   HDL 41 06/10/2020   LDLCALC 111 (H) 06/10/2020   Lab Results  Component Value Date   HAV NON-REACTIVE 06/10/2020   Lab Results  Component Value Date   HEPBSAG NON-REACTIVE 06/10/2020   HEPBSAB NON-REACTIVE 06/10/2020   No results found for: HCVAB Lab Results  Component Value Date   CHLAMYDIAWP Negative 06/10/2020   N Negative 06/10/2020    Health Maintenance: Immunization History  Administered Date(s) Administered  . Influenza,inj,Quad PF,6+ Mos 03/15/2018  . PFIZER(Purple Top)SARS-COV-2 Vaccination 08/01/2019, 08/21/2019, 04/06/2020  . Pneumococcal Conjugate-13 07/09/2020    Assessment/Plan: Problem List Items Addressed This Visit      Other   HIV disease (HCC) - Primary   Relevant Medications   bictegravir-emtricitabine-tenofovir AF (BIKTARVY) 50-200-25 MG TABS tablet   Other Relevant Orders   HIV-1 RNA ultraquant reflex to gentyp+     HIV Continue Biktarvy daily Fu in 3 months,  can be a video visit  Re-inforced compliance  Late latent Syphilis Completed 28 days of Doxycyline  Repeat RPR in next visit  STD Screening  Urine GC and RPR  Immunization Refused PCV 23 and other vaccines today   I have personally spent 30  minutes involved in face-to-face and non-face-to-face activities for this patient on the day of the visit.   Patient's labs were reviewed as well as his previous records. Patients questions were addressed and answered. Safe sex counseling done.   Electronically signed by:  Odette Fraction, MD Infectious Disease Physician [facility] for Infect[facility] Disease [address] [place], [place] [address] Phone: [phone]  Fax: [phone]

## 2020-09-02 ENCOUNTER — Other Ambulatory Visit (HOSPITAL_COMMUNITY): Payer: Self-pay

## 2020-09-02 MED FILL — Bictegravir-Emtricitabine-Tenofovir AF Tab 50-200-25 MG: ORAL | 30 days supply | Qty: 30 | Fill #0 | Status: AC

## 2020-09-07 ENCOUNTER — Ambulatory Visit: Payer: Commercial Managed Care - PPO | Admitting: Infectious Diseases

## 2020-09-08 ENCOUNTER — Other Ambulatory Visit (HOSPITAL_COMMUNITY): Payer: Self-pay

## 2020-09-13 LAB — HIV-1 RNA ULTRAQUANT REFLEX TO GENTYP+
HIV 1 RNA Quant: NOT DETECTED copies/mL
HIV-1 RNA Quant, Log: NOT DETECTED Log copies/mL

## 2020-10-04 ENCOUNTER — Other Ambulatory Visit (HOSPITAL_COMMUNITY): Payer: Self-pay

## 2020-10-04 MED FILL — Bictegravir-Emtricitabine-Tenofovir AF Tab 50-200-25 MG: ORAL | 30 days supply | Qty: 30 | Fill #1 | Status: AC

## 2020-10-06 ENCOUNTER — Other Ambulatory Visit (HOSPITAL_COMMUNITY): Payer: Self-pay

## 2020-10-07 ENCOUNTER — Other Ambulatory Visit (HOSPITAL_COMMUNITY): Payer: Self-pay

## 2020-11-01 ENCOUNTER — Other Ambulatory Visit (HOSPITAL_COMMUNITY): Payer: Self-pay

## 2020-11-09 ENCOUNTER — Other Ambulatory Visit (HOSPITAL_COMMUNITY): Payer: Self-pay

## 2020-11-09 MED FILL — Bictegravir-Emtricitabine-Tenofovir AF Tab 50-200-25 MG: ORAL | 30 days supply | Qty: 30 | Fill #0 | Status: AC

## 2020-11-26 ENCOUNTER — Ambulatory Visit: Payer: Commercial Managed Care - PPO | Admitting: Infectious Diseases

## 2020-12-07 ENCOUNTER — Other Ambulatory Visit (HOSPITAL_COMMUNITY): Payer: Self-pay

## 2020-12-07 MED FILL — Bictegravir-Emtricitabine-Tenofovir AF Tab 50-200-25 MG: ORAL | 30 days supply | Qty: 30 | Fill #1 | Status: AC

## 2020-12-29 ENCOUNTER — Other Ambulatory Visit (HOSPITAL_COMMUNITY): Payer: Self-pay

## 2021-01-03 ENCOUNTER — Other Ambulatory Visit (HOSPITAL_COMMUNITY): Payer: Self-pay

## 2021-01-10 ENCOUNTER — Ambulatory Visit: Payer: Self-pay | Admitting: Infectious Diseases

## 2021-01-12 ENCOUNTER — Other Ambulatory Visit (HOSPITAL_COMMUNITY): Payer: Self-pay

## 2021-01-12 MED FILL — Bictegravir-Emtricitabine-Tenofovir AF Tab 50-200-25 MG: ORAL | 30 days supply | Qty: 30 | Fill #2 | Status: AC

## 2021-02-01 ENCOUNTER — Other Ambulatory Visit: Payer: Self-pay

## 2021-02-03 ENCOUNTER — Other Ambulatory Visit (HOSPITAL_COMMUNITY): Payer: Self-pay

## 2021-02-07 ENCOUNTER — Other Ambulatory Visit (HOSPITAL_COMMUNITY): Payer: Self-pay

## 2021-02-07 ENCOUNTER — Ambulatory Visit (INDEPENDENT_AMBULATORY_CARE_PROVIDER_SITE_OTHER): Payer: BC Managed Care – PPO | Admitting: Pharmacist

## 2021-02-07 ENCOUNTER — Other Ambulatory Visit: Payer: Self-pay

## 2021-02-07 DIAGNOSIS — Z23 Encounter for immunization: Secondary | ICD-10-CM | POA: Diagnosis not present

## 2021-02-07 DIAGNOSIS — B20 Human immunodeficiency virus [HIV] disease: Secondary | ICD-10-CM | POA: Diagnosis not present

## 2021-02-07 MED ORDER — BICTEGRAVIR-EMTRICITAB-TENOFOV 50-200-25 MG PO TABS
1.0000 | ORAL_TABLET | Freq: Every day | ORAL | 3 refills | Status: DC
  Filled 2021-02-07 – 2021-03-04 (×2): qty 30, 30d supply, fill #0
  Filled 2021-04-11: qty 30, 30d supply, fill #1
  Filled 2021-05-27: qty 30, 30d supply, fill #2

## 2021-02-07 MED FILL — Bictegravir-Emtricitabine-Tenofovir AF Tab 50-200-25 MG: ORAL | 30 days supply | Qty: 30 | Fill #3 | Status: AC

## 2021-02-07 NOTE — Progress Notes (Signed)
02/07/2021  HPI: David [name] is a 55 y.o. male who presents to the [facility] pharmacy clinic for HIV follow-up.  Patient Active Problem List   Diagnosis Date Noted   HIV disease (HCC) 06/10/2020   Screening for STDs (sexually transmitted diseases) 06/10/2020   Immunization counseling 06/10/2020   Asthma in adult, mild persistent, with acute exacerbation 05/22/2020   COVID-19 virus infection 05/22/2020   Acute respiratory failure with hypoxia (HCC) 05/22/2020   Asthma exacerbation 05/22/2020    Patient's Medications  New Prescriptions   No medications on file  Previous Medications   ALBUTEROL (PROVENTIL) (2.5 MG/3ML) 0.083% NEBULIZER SOLUTION    Take 3 mLs (2.5 mg total) by nebulization every 6 (six) hours as needed for wheezing or shortness of breath.   ALBUTEROL (VENTOLIN HFA) 108 (90 BASE) MCG/ACT INHALER    Inhale 2 puffs into the lungs every 6 (six) hours as needed for wheezing or shortness of breath.   ASCORBIC ACID (VITAMIN C) 500 MG TABLET    Take 1 tablet (500 mg total) by mouth daily.   BICTEGRAVIR-EMTRICITABINE-TENOFOVIR AF (BIKTARVY) 50-200-25 MG TABS TABLET    TAKE 1 TABLET BY MOUTH DAILY.   BICTEGRAVIR-EMTRICITABINE-TENOFOVIR AF (BIKTARVY) 50-200-25 MG TABS TABLET    TAKE 1 TABLET BY MOUTH DAILY.   ZINC SULFATE 220 (50 ZN) MG CAPSULE    Take 1 capsule (220 mg total) by mouth daily.  Modified Medications   No medications on file  Discontinued Medications   No medications on file    Allergies: Allergies  Allergen Reactions   Penicillins    Penicillins Swelling    Past Medical History: Past Medical History:  Diagnosis Date   Asthma     Social History: Social History   Socioeconomic History   Marital status: Single    Spouse name: Not on file   Number of children: Not on file   Years of education: Not on file   Highest education level: Not on file  Occupational History   Not on file  Tobacco Use   Smoking status: Former    Packs/day: 0.25     Types: Cigarettes   Smokeless tobacco: Never  Vaping Use   Vaping Use: Never used  Substance and Sexual Activity   Alcohol use: No   Drug use: No   Sexual activity: Not on file  Other Topics Concern   Not on file  Social History Narrative   ** Merged History Encounter **       Social Determinants of Health   Financial Resource Strain: Not on file  Food Insecurity: Not on file  Transportation Needs: Not on file  Physical Activity: Not on file  Stress: Not on file  Social Connections: Not on file    Labs: Lab Results  Component Value Date   HIV1RNAQUANT NOT DETECTED 08/27/2020   HIV1RNAQUANT <20 NOT DETECTED 07/09/2020   HIV1RNAQUANT 100 (H) 06/10/2020   CD4TABS 695 06/10/2020    RPR and STI Lab Results  Component Value Date   LABRPR REACTIVE (A) 07/09/2020   LABRPR REACTIVE (A) 06/10/2020   RPRTITER 1:128 (H) 07/09/2020   RPRTITER 1:128 (H) 06/10/2020    STI Results GC CT  06/10/2020 Negative Negative    Hepatitis B Lab Results  Component Value Date   HEPBSAB NON-REACTIVE 06/10/2020   HEPBSAG NON-REACTIVE 06/10/2020   HEPBCAB REACTIVE (A) 06/10/2020   Hepatitis C Lab Results  Component Value Date   HEPCAB NON-REACTIVE 06/10/2020   Hepatitis A Lab Results  Component  Value Date   HAV NON-REACTIVE 06/10/2020   Lipids: Lab Results  Component Value Date   CHOL 172 06/10/2020   TRIG 92 06/10/2020   HDL 41 06/10/2020   CHOLHDL 4.2 06/10/2020   LDLCALC 111 (H) 06/10/2020    Current HIV Regimen: Biktarvy  Assessment: [name] presents for HIV follow up after missing 3 appointments with Dr. Elinor Parkinson over the last few months, though he reports that he hasn't missed any visits and was seen in July (don't see this documented in the chart). Reports no issues with Biktarvy. Reports some constipation that he thinks is related to not drinking enough water. Otherwise, reports no adverse effects, missed doses, or issues getting it from his pharmacy [facility] mail  order). Confirmed that the address in the chart is correct. He reports he has not been sexually active since last visit and declines STI testing today. Need to check RPR today after completing treatment with doxycycline for syphilis at last office visit in April.   He is due for multiple vaccines: hepatitis A, hepatitis B, Pneumovax 23. He would like to receive one at a time and to get Pneumovax today because he has asthma. Discussed that the monkeypox vaccine is available but he is not interested in this.   He asks what medications he can take for seasonal allergies that doesn't interact with Biktarvy. Informed him that he can take Zyrtec or Claritin.  He would like his future appointments to be scheduled on a Monday since he is off from work that day. Dr. Elinor Parkinson does not have a Monday appointment open until May. He would like to switch to another provider since he can't come on other days of the week. Scheduled with next available Monday appointment with Dr. Renold Don.   Plan: -Check HIV RNA, CD4 count, RPR. Patient received an emergency phone call while waiting for lab draw and was unable to complete these today. Will get him rescheduled for lab work.  -Continue Biktarvy -Administered Pneumovax 23 today  -Follow up scheduled on 06/06/21 with Dr. Tommie Raymond, PharmD PGY2 Ambulatory Care Pharmacy Resident 02/07/2021 11:20 AM

## 2021-02-25 ENCOUNTER — Encounter: Payer: Self-pay | Admitting: Emergency Medicine

## 2021-02-25 ENCOUNTER — Other Ambulatory Visit: Payer: Self-pay

## 2021-02-25 ENCOUNTER — Emergency Department
Admission: EM | Admit: 2021-02-25 | Discharge: 2021-02-25 | Disposition: A | Discharge status: Home / Self Care | Payer: BC Managed Care – PPO | Attending: Emergency Medicine | Admitting: Emergency Medicine

## 2021-02-25 DIAGNOSIS — Z79899 Other long term (current) drug therapy: Secondary | ICD-10-CM | POA: Insufficient documentation

## 2021-02-25 DIAGNOSIS — Z8616 Personal history of COVID-19: Secondary | ICD-10-CM | POA: Insufficient documentation

## 2021-02-25 DIAGNOSIS — Z87891 Personal history of nicotine dependence: Secondary | ICD-10-CM | POA: Insufficient documentation

## 2021-02-25 DIAGNOSIS — Z21 Asymptomatic human immunodeficiency virus [HIV] infection status: Secondary | ICD-10-CM | POA: Diagnosis not present

## 2021-02-25 DIAGNOSIS — J4531 Mild persistent asthma with (acute) exacerbation: Secondary | ICD-10-CM | POA: Diagnosis not present

## 2021-02-25 DIAGNOSIS — L249 Irritant contact dermatitis, unspecified cause: Secondary | ICD-10-CM | POA: Diagnosis not present

## 2021-02-25 DIAGNOSIS — R21 Rash and other nonspecific skin eruption: Secondary | ICD-10-CM | POA: Diagnosis not present

## 2021-02-25 MED ORDER — BACITRACIN-NEOMYCIN-POLYMYXIN 400-5-5000 EX OINT
TOPICAL_OINTMENT | CUTANEOUS | Status: AC
  Filled 2021-02-25: qty 1

## 2021-02-25 MED ORDER — BACITRACIN-NEOMYCIN-POLYMYXIN 400-5-5000 EX OINT: TOPICAL_OINTMENT | CUTANEOUS | Status: DC | PRN

## 2021-02-25 MED ORDER — PREDNISONE 20 MG PO TABS
40.0000 mg | ORAL_TABLET | Freq: Once | ORAL | Status: AC
  Administered 2021-02-25: 40 mg via ORAL
  Filled 2021-02-25: qty 2

## 2021-02-25 MED ORDER — PREDNISONE 50 MG PO TABS: 50.0000 mg | ORAL_TABLET | Freq: Every day | ORAL | 0 refills | Status: AC

## 2021-02-25 NOTE — ED Triage Notes (Signed)
Pt to ED via POV with c/o blisters that started approx 1 week ago, pt states started to R foot then progressed to R knee and to R wrist. Pt with noted open pustules that are leaking clear fluid at this time.

## 2021-02-25 NOTE — ED Notes (Signed)
Pt resting in bed with no complaints at this time, alert and oriented, call light in reach, bed locked and low.

## 2021-02-25 NOTE — ED Provider Notes (Addendum)
[facility] Emergency Department Provider Note   ____________________________________________   Event Date/Time   First MD Initiated Contact with Patient 02/25/21 0730     (approximate)  I have reviewed the triage vital signs and the nursing notes.   HISTORY  Chief Complaint Rash   HPI David [name] is a 54 y.o. male history of HIV.  He reports full compliance with his HIV therapy and follows regularly with the clinic  Also reports a history of asthma not currently causing any issues.  Denies any recent illness such as fevers chills cough runny nose red eyes or other concerns.  Noticed about 2 weeks ago that he started getting a blistery itchy rash on his left foot and then also noted on his wrist and across the front of his knees.  Has blistered some of the blisters popped and have started to heal over.  Denies any fevers or chills no redness its not painful.  He has seen some clear fluid leak from a few of the blisters.  They are not progressing and have remained in the same locations except it first was noticed on his foot that popped up quite quickly after on his knees and wrist  Denies any insect bites or tick bites.  At first he thought this could have been a something like a red ant bite as he started to notice it just not long after about helping his aunt  digging in the dirt in a garden  Denies sexual activity with men or anyone else except for very remotely well over several months ago.  He is not currently describing self is sexually active  Past Medical History:  Diagnosis Date  . Asthma     Patient Active Problem List   Diagnosis Date Noted  . HIV disease (HCC) 06/10/2020  . Screening for STDs (sexually transmitted diseases) 06/10/2020  . Immunization counseling 06/10/2020  . Asthma in adult, mild persistent, with acute exacerbation 05/22/2020  . COVID-19 virus infection 05/22/2020  . Acute respiratory failure with hypoxia (HCC)  05/22/2020  . Asthma exacerbation 05/22/2020    History reviewed. No pertinent surgical history.  Prior to Admission medications   Medication Sig Start Date End Date Taking? Authorizing Provider  predniSONE (DELTASONE) 50 MG tablet Take 1 tablet (50 mg total) by mouth daily with breakfast for 4 days. Begin on 02-26-2021 02/25/21 03/01/21 Yes Sharyn Creamer, MD  albuterol (PROVENTIL) (2.5 MG/3ML) 0.083% nebulizer solution Take 3 mLs (2.5 mg total) by nebulization every 6 (six) hours as needed for wheezing or shortness of breath. 05/22/20   Arnetha Courser, MD  albuterol (VENTOLIN HFA) 108 (90 Base) MCG/ACT inhaler Inhale 2 puffs into the lungs every 6 (six) hours as needed for wheezing or shortness of breath. 05/22/20   Arnetha Courser, MD  ascorbic acid (VITAMIN C) 500 MG tablet Take 1 tablet (500 mg total) by mouth daily. 05/23/20   Arnetha Courser, MD  bictegravir-emtricitabine-tenofovir AF (BIKTARVY) 50-200-25 MG TABS tablet Take 1 tablet by mouth daily. 02/07/21   Kuppelweiser, Cassie L, RPH-CPP  zinc sulfate 220 (50 Zn) MG capsule Take 1 capsule (220 mg total) by mouth daily. 05/23/20   Arnetha Courser, MD    Allergies Penicillins and Penicillins  Family History  Problem Relation Age of Onset  . Cancer Mother     Social History Social History   Tobacco Use  . Smoking status: Former    Packs/day: 0.25    Types: Cigarettes  . Smokeless tobacco: Never  Vaping Use  . Vaping Use: Never used  Substance Use Topics  . Alcohol use: No  . Drug use: No    Review of Systems Constitutional: No fever/chills Eyes: No visual changes. ENT: No sore throat. Cardiovascular: Denies chest pain. Respiratory: Denies shortness of breath. Gastrointestinal: No abdominal pain.   Musculoskeletal: Negative for back pain. Skin: See HPI. Neurological: Negative for headaches, areas of focal weakness or numbness.    ____________________________________________   PHYSICAL EXAM:  VITAL SIGNS: ED Triage  Vitals  Enc Vitals Group     BP 02/25/21 0653 (!) 162/112     Pulse Rate 02/25/21 0653 (!) 110     Resp 02/25/21 0653 20     Temp 02/25/21 0653 98.4 F (36.9 C)     Temp Source 02/25/21 0653 Oral     SpO2 02/25/21 0653 96 %     Weight --      Height --      Head Circumference --      Peak Flow --      Pain Score 02/25/21 0654 0     Pain Loc --      Pain Edu? --      Excl. in GC? --     Constitutional: Alert and oriented. Well appearing and in no acute distress.  Very pleasant. Eyes: Conjunctivae are normal. Head: Atraumatic. Nose: No congestion/rhinnorhea. Mouth/Throat: Mucous membranes are moist. Neck: No stridor.  Cardiovascular: Normal rate, regular rhythm. Grossly normal heart sounds.  Good peripheral circulation. Respiratory: Normal respiratory effort.  No retractions. Lungs CTAB. Gastrointestinal: Soft and nontender. No distention. Musculoskeletal: No lower extremity tenderness nor edema. Neurologic:  Normal speech and language. No gross focal neurologic deficits are appreciated.  Skin:  Skin is warm, dry and intact except as noted. No rash noted over examination the entirety of the body save genital area, but he is noted to have bumps and blisters without erythema or purulent drainage bumps are noted to be relatively small couple the blisters are about dime sized and appear to be clear fluid-filled overlying his extensor surfaces of his wrist as well as the anterior knee Is also on the base of his left foot, with the lesions on his left foot seem to have healed over well he just continue to have "water blisters" over his anterior knees.  These do not appear to be pox-like in nature. No lesions appear infected. No purulence or honey like discharges. No dermatomal distributions. No 'sand paper' rash.  Psychiatric: Mood and affect are normal. Speech and behavior are normal.  ____________________________________________   LABS (all labs ordered are listed, but only abnormal  results are displayed)  Labs Reviewed  MONKEYPOX VIRUS DNA, QUALITATIVE REAL-TIME PCR   ____________________________________________  EKG   ____________________________________________  RADIOLOGY   ____________________________________________   PROCEDURES  Procedure(s) performed: None  Procedures  Critical Care performed: No  ____________________________________________   INITIAL IMPRESSION / ASSESSMENT AND PLAN / ED COURSE  Pertinent labs & imaging results that were available during my care of the patient were reviewed by me and considered in my medical decision making (see chart for details).   No evidence of any areas of superinfection.  No preceding viral prodrome.  Denies any infectious symptoms.  Nontoxic well-appearing male.  Clinical history after discussing with the patient he did notes that the symptoms did start very soon after having spent time digging in the dirt on his hands and knees wearing sandals and no gloves and at the areas where the  lesions are to correspond to how he would have been positioned in taking to help his arm.  He has no evidence  suggestive of an acute infectious etiology. (CMV, shingles, syphillis, etc) This appears most consistent with contact dermatitis or a Toxicodendron type rash.  Discussed with patient, will treat with prednisone, advised on careful return precautions, and recommend follow-up with his primary or infectious disease clinic  Return precautions and treatment recommendations and follow-up discussed with the patient who is agreeable with the plan.       ____________________________________________   FINAL CLINICAL IMPRESSION(S) / ED DIAGNOSES  Final diagnoses:  Irritant contact dermatitis, unspecified trigger        Note:  This document was prepared using Dragon voice recognition software and may include unintentional dictation errors       Sharyn Creamer, MD 02/25/21 1884    Sharyn Creamer, MD 02/25/21  1551

## 2021-02-25 NOTE — ED Notes (Signed)
Dr Fanny Bien in with pt.

## 2021-02-26 LAB — MONKEYPOX VIRUS DNA, QUALITATIVE REAL-TIME PCR: Orthopoxvirus DNA, QL PCR: NOT DETECTED

## 2021-03-04 ENCOUNTER — Other Ambulatory Visit (HOSPITAL_COMMUNITY): Payer: Self-pay

## 2021-03-04 DIAGNOSIS — J45909 Unspecified asthma, uncomplicated: Secondary | ICD-10-CM | POA: Diagnosis not present

## 2021-03-04 DIAGNOSIS — Z03818 Encounter for observation for suspected exposure to other biological agents ruled out: Secondary | ICD-10-CM | POA: Diagnosis not present

## 2021-03-04 DIAGNOSIS — K529 Noninfective gastroenteritis and colitis, unspecified: Secondary | ICD-10-CM | POA: Diagnosis not present

## 2021-03-10 DIAGNOSIS — L03115 Cellulitis of right lower limb: Secondary | ICD-10-CM | POA: Diagnosis not present

## 2021-03-14 ENCOUNTER — Other Ambulatory Visit (HOSPITAL_COMMUNITY): Payer: Self-pay

## 2021-03-17 ENCOUNTER — Inpatient Hospital Stay (HOSPITAL_COMMUNITY)
Admission: EM | Admit: 2021-03-17 | Discharge: 2021-03-20 | DRG: 975 | Disposition: A | Discharge status: Home / Self Care | Payer: BC Managed Care – PPO | Attending: Internal Medicine | Admitting: Internal Medicine

## 2021-03-17 DIAGNOSIS — R6520 Severe sepsis without septic shock: Secondary | ICD-10-CM | POA: Diagnosis present

## 2021-03-17 DIAGNOSIS — S90821A Blister (nonthermal), right foot, initial encounter: Secondary | ICD-10-CM | POA: Diagnosis not present

## 2021-03-17 DIAGNOSIS — I10 Essential (primary) hypertension: Secondary | ICD-10-CM | POA: Diagnosis not present

## 2021-03-17 DIAGNOSIS — Z79899 Other long term (current) drug therapy: Secondary | ICD-10-CM | POA: Diagnosis not present

## 2021-03-17 DIAGNOSIS — L97519 Non-pressure chronic ulcer of other part of right foot with unspecified severity: Secondary | ICD-10-CM | POA: Diagnosis not present

## 2021-03-17 DIAGNOSIS — J45909 Unspecified asthma, uncomplicated: Secondary | ICD-10-CM | POA: Diagnosis present

## 2021-03-17 DIAGNOSIS — A419 Sepsis, unspecified organism: Principal | ICD-10-CM | POA: Diagnosis present

## 2021-03-17 DIAGNOSIS — R7989 Other specified abnormal findings of blood chemistry: Secondary | ICD-10-CM | POA: Diagnosis present

## 2021-03-17 DIAGNOSIS — R9431 Abnormal electrocardiogram [ECG] [EKG]: Secondary | ICD-10-CM | POA: Diagnosis not present

## 2021-03-17 DIAGNOSIS — Z880 Allergy status to penicillin: Secondary | ICD-10-CM

## 2021-03-17 DIAGNOSIS — Z20822 Contact with and (suspected) exposure to covid-19: Secondary | ICD-10-CM | POA: Diagnosis not present

## 2021-03-17 DIAGNOSIS — B20 Human immunodeficiency virus [HIV] disease: Secondary | ICD-10-CM | POA: Diagnosis not present

## 2021-03-17 DIAGNOSIS — L03115 Cellulitis of right lower limb: Secondary | ICD-10-CM | POA: Diagnosis present

## 2021-03-17 DIAGNOSIS — Z87891 Personal history of nicotine dependence: Secondary | ICD-10-CM | POA: Diagnosis not present

## 2021-03-17 DIAGNOSIS — X58XXXA Exposure to other specified factors, initial encounter: Secondary | ICD-10-CM | POA: Diagnosis present

## 2021-03-17 DIAGNOSIS — N179 Acute kidney failure, unspecified: Secondary | ICD-10-CM | POA: Diagnosis present

## 2021-03-17 DIAGNOSIS — J4530 Mild persistent asthma, uncomplicated: Secondary | ICD-10-CM | POA: Diagnosis not present

## 2021-03-17 DIAGNOSIS — R600 Localized edema: Secondary | ICD-10-CM | POA: Diagnosis not present

## 2021-03-17 HISTORY — DX: Human immunodeficiency virus (HIV) disease: B20

## 2021-03-17 HISTORY — DX: Asymptomatic human immunodeficiency virus (hiv) infection status: Z21

## 2021-03-17 NOTE — ED Triage Notes (Signed)
Pt c/o right ankle swelling since yesterday. Pt was previously seen for possible spider bite or poison ivy and was prescribed abx. Pt states foot is improving with abx, but swelling is new. Denies fevers, diarrhea.

## 2021-03-18 ENCOUNTER — Emergency Department (HOSPITAL_COMMUNITY): Payer: BC Managed Care – PPO

## 2021-03-18 ENCOUNTER — Encounter (HOSPITAL_COMMUNITY): Payer: Self-pay | Admitting: Emergency Medicine

## 2021-03-18 ENCOUNTER — Inpatient Hospital Stay (HOSPITAL_COMMUNITY): Payer: BC Managed Care – PPO

## 2021-03-18 ENCOUNTER — Other Ambulatory Visit: Payer: Self-pay

## 2021-03-18 DIAGNOSIS — N179 Acute kidney failure, unspecified: Secondary | ICD-10-CM

## 2021-03-18 DIAGNOSIS — I10 Essential (primary) hypertension: Secondary | ICD-10-CM | POA: Diagnosis present

## 2021-03-18 DIAGNOSIS — Z87891 Personal history of nicotine dependence: Secondary | ICD-10-CM | POA: Diagnosis not present

## 2021-03-18 DIAGNOSIS — B20 Human immunodeficiency virus [HIV] disease: Secondary | ICD-10-CM

## 2021-03-18 DIAGNOSIS — A419 Sepsis, unspecified organism: Principal | ICD-10-CM

## 2021-03-18 DIAGNOSIS — L03115 Cellulitis of right lower limb: Secondary | ICD-10-CM

## 2021-03-18 DIAGNOSIS — R6520 Severe sepsis without septic shock: Secondary | ICD-10-CM

## 2021-03-18 DIAGNOSIS — X58XXXA Exposure to other specified factors, initial encounter: Secondary | ICD-10-CM | POA: Diagnosis present

## 2021-03-18 DIAGNOSIS — Z880 Allergy status to penicillin: Secondary | ICD-10-CM | POA: Diagnosis not present

## 2021-03-18 DIAGNOSIS — L97519 Non-pressure chronic ulcer of other part of right foot with unspecified severity: Secondary | ICD-10-CM | POA: Diagnosis present

## 2021-03-18 DIAGNOSIS — R7989 Other specified abnormal findings of blood chemistry: Secondary | ICD-10-CM | POA: Diagnosis present

## 2021-03-18 DIAGNOSIS — Z20822 Contact with and (suspected) exposure to covid-19: Secondary | ICD-10-CM | POA: Diagnosis present

## 2021-03-18 DIAGNOSIS — J4530 Mild persistent asthma, uncomplicated: Secondary | ICD-10-CM | POA: Diagnosis present

## 2021-03-18 DIAGNOSIS — S90821A Blister (nonthermal), right foot, initial encounter: Secondary | ICD-10-CM | POA: Diagnosis present

## 2021-03-18 DIAGNOSIS — Z79899 Other long term (current) drug therapy: Secondary | ICD-10-CM | POA: Diagnosis not present

## 2021-03-18 LAB — COMPREHENSIVE METABOLIC PANEL
ALT: 27 U/L (ref 0–44)
ALT: 22 U/L (ref 0–44)
AST: 24 U/L (ref 15–41)
AST: 22 U/L (ref 15–41)
Albumin: 4 g/dL (ref 3.5–5.0)
Albumin: 3.1 g/dL — ABNORMAL LOW (ref 3.5–5.0)
Alkaline Phosphatase: 91 U/L (ref 38–126)
Alkaline Phosphatase: 71 U/L (ref 38–126)
Anion gap: 8 (ref 5–15)
Anion gap: 5 (ref 5–15)
BUN: 19 mg/dL (ref 6–20)
BUN: 17 mg/dL (ref 6–20)
CO2: 24 mmol/L (ref 22–32)
CO2: 24 mmol/L (ref 22–32)
Calcium: 8.4 mg/dL — ABNORMAL LOW (ref 8.9–10.3)
Calcium: 8 mg/dL — ABNORMAL LOW (ref 8.9–10.3)
Chloride: 100 mmol/L (ref 98–111)
Chloride: 105 mmol/L (ref 98–111)
Creatinine, Ser: 1.63 mg/dL — ABNORMAL HIGH (ref 0.61–1.24)
Creatinine, Ser: 1.25 mg/dL — ABNORMAL HIGH (ref 0.61–1.24)
GFR, Estimated: 49 mL/min — ABNORMAL LOW (ref >60)
GFR, Estimated: >60 mL/min (ref >60)
Glucose, Bld: 98 mg/dL (ref 70–99)
Glucose, Bld: 137 mg/dL — ABNORMAL HIGH (ref 70–99)
Potassium: 3.8 mmol/L (ref 3.5–5.1)
Potassium: 3.6 mmol/L (ref 3.5–5.1)
Sodium: 132 mmol/L — ABNORMAL LOW (ref 135–145)
Sodium: 134 mmol/L — ABNORMAL LOW (ref 135–145)
Total Bilirubin: 0.9 mg/dL (ref 0.3–1.2)
Total Bilirubin: 0.9 mg/dL (ref 0.3–1.2)
Total Protein: 8.5 g/dL — ABNORMAL HIGH (ref 6.5–8.1)
Total Protein: 6.9 g/dL (ref 6.5–8.1)

## 2021-03-18 LAB — CBC WITH DIFFERENTIAL/PLATELET
Abs Immature Granulocytes: 0.03 10*3/uL (ref 0.00–0.07)
Abs Immature Granulocytes: 0.05 10*3/uL (ref 0.00–0.07)
Basophils Absolute: 0 10*3/uL (ref 0.0–0.1)
Basophils Absolute: 0.1 10*3/uL (ref 0.0–0.1)
Basophils Relative: 0 %
Basophils Relative: 1 %
Eosinophils Absolute: 1.5 10*3/uL — ABNORMAL HIGH (ref 0.0–0.5)
Eosinophils Absolute: 1.5 10*3/uL — ABNORMAL HIGH (ref 0.0–0.5)
Eosinophils Relative: 12 %
Eosinophils Relative: 17 %
HCT: 35.6 % — ABNORMAL LOW (ref 39.0–52.0)
HCT: 40.7 % (ref 39.0–52.0)
Hemoglobin: 12 g/dL — ABNORMAL LOW (ref 13.0–17.0)
Hemoglobin: 13.6 g/dL (ref 13.0–17.0)
Immature Granulocytes: 0 %
Immature Granulocytes: 0 %
Lymphocytes Relative: 22 %
Lymphocytes Relative: 26 %
Lymphs Abs: 2 10*3/uL (ref 0.7–4.0)
Lymphs Abs: 3.2 10*3/uL (ref 0.7–4.0)
MCH: 34.7 pg — ABNORMAL HIGH (ref 26.0–34.0)
MCH: 34.8 pg — ABNORMAL HIGH (ref 26.0–34.0)
MCHC: 33.4 g/dL (ref 30.0–36.0)
MCHC: 33.7 g/dL (ref 30.0–36.0)
MCV: 102.9 fL — ABNORMAL HIGH (ref 80.0–100.0)
MCV: 104.1 fL — ABNORMAL HIGH (ref 80.0–100.0)
Monocytes Absolute: 0.5 10*3/uL (ref 0.1–1.0)
Monocytes Absolute: 0.6 10*3/uL (ref 0.1–1.0)
Monocytes Relative: 5 %
Monocytes Relative: 5 %
Neutro Abs: 5 10*3/uL (ref 1.7–7.7)
Neutro Abs: 7 10*3/uL (ref 1.7–7.7)
Neutrophils Relative %: 55 %
Neutrophils Relative %: 57 %
Platelets: 238 10*3/uL (ref 150–400)
Platelets: 293 10*3/uL (ref 150–400)
RBC: 3.46 MIL/uL — ABNORMAL LOW (ref 4.22–5.81)
RBC: 3.91 MIL/uL — ABNORMAL LOW (ref 4.22–5.81)
RDW: 12.2 % (ref 11.5–15.5)
RDW: 12.4 % (ref 11.5–15.5)
WBC: 12.3 10*3/uL — ABNORMAL HIGH (ref 4.0–10.5)
WBC: 9.1 10*3/uL (ref 4.0–10.5)
nRBC: 0 % (ref 0.0–0.2)
nRBC: 0 % (ref 0.0–0.2)

## 2021-03-18 LAB — URINALYSIS, COMPLETE (UACMP) WITH MICROSCOPIC
Bilirubin Urine: NEGATIVE
Glucose, UA: NEGATIVE mg/dL
Ketones, ur: NEGATIVE mg/dL
Leukocytes,Ua: NEGATIVE
Nitrite: NEGATIVE
Protein, ur: NEGATIVE mg/dL
Specific Gravity, Urine: 1.015 (ref 1.005–1.030)
pH: 6 (ref 5.0–8.0)

## 2021-03-18 LAB — RESP PANEL BY RT-PCR (FLU A&B, COVID) ARPGX2
Influenza A by PCR: NEGATIVE
Influenza B by PCR: NEGATIVE
SARS Coronavirus 2 by RT PCR: NEGATIVE

## 2021-03-18 LAB — MAGNESIUM: Magnesium: 1.9 mg/dL (ref 1.7–2.4)

## 2021-03-18 LAB — APTT: aPTT: 32 seconds (ref 24–36)

## 2021-03-18 LAB — CREATININE, URINE, RANDOM: Creatinine, Urine: 151.23 mg/dL

## 2021-03-18 LAB — PROTIME-INR
INR: 1 (ref 0.8–1.2)
Prothrombin Time: 13.4 seconds (ref 11.4–15.2)

## 2021-03-18 LAB — SODIUM, URINE, RANDOM: Sodium, Ur: 74 mmol/L

## 2021-03-18 LAB — LACTIC ACID, PLASMA: Lactic Acid, Venous: 1 mmol/L (ref 0.5–1.9)

## 2021-03-18 MED ORDER — VANCOMYCIN HCL 1750 MG/350ML IV SOLN
1750.0000 mg | INTRAVENOUS | Status: DC
  Administered 2021-03-19: 1750 mg via INTRAVENOUS
  Filled 2021-03-18: qty 350

## 2021-03-18 MED ORDER — CLINDAMYCIN PHOSPHATE 900 MG/50ML IV SOLN
900.0000 mg | Freq: Once | INTRAVENOUS | Status: AC
  Administered 2021-03-18: 900 mg via INTRAVENOUS
  Filled 2021-03-18: qty 50

## 2021-03-18 MED ORDER — BICTEGRAVIR-EMTRICITAB-TENOFOV 50-200-25 MG PO TABS
1.0000 | ORAL_TABLET | Freq: Every day | ORAL | Status: DC
  Administered 2021-03-18 – 2021-03-20 (×3): 1 via ORAL
  Filled 2021-03-18 (×3): qty 1

## 2021-03-18 MED ORDER — NALOXONE HCL 0.4 MG/ML IJ SOLN: 0.4000 mg | INTRAMUSCULAR | Status: DC | PRN

## 2021-03-18 MED ORDER — ONDANSETRON HCL 4 MG/2ML IJ SOLN
4.0000 mg | Freq: Four times a day (QID) | INTRAMUSCULAR | Status: DC | PRN
  Administered 2021-03-18: 4 mg via INTRAVENOUS
  Filled 2021-03-18: qty 2

## 2021-03-18 MED ORDER — HYDRALAZINE HCL 25 MG PO TABS
25.0000 mg | ORAL_TABLET | Freq: Three times a day (TID) | ORAL | Status: DC
  Administered 2021-03-18 – 2021-03-20 (×7): 25 mg via ORAL
  Filled 2021-03-18 (×7): qty 1

## 2021-03-18 MED ORDER — ACETAMINOPHEN 650 MG RE SUPP: 650.0000 mg | Freq: Four times a day (QID) | RECTAL | Status: DC | PRN

## 2021-03-18 MED ORDER — VANCOMYCIN HCL 2000 MG/400ML IV SOLN
2000.0000 mg | Freq: Once | INTRAVENOUS | Status: AC
  Administered 2021-03-18: 2000 mg via INTRAVENOUS
  Filled 2021-03-18: qty 400

## 2021-03-18 MED ORDER — FENTANYL CITRATE PF 50 MCG/ML IJ SOSY
50.0000 ug | PREFILLED_SYRINGE | INTRAMUSCULAR | Status: DC | PRN
Start: 2021-03-18 — End: 2021-03-20
  Administered 2021-03-18 (×2): 50 ug via INTRAVENOUS
  Filled 2021-03-18 (×2): qty 1

## 2021-03-18 MED ORDER — LACTATED RINGERS IV SOLN: INTRAVENOUS | Status: DC

## 2021-03-18 MED ORDER — ALBUTEROL SULFATE (2.5 MG/3ML) 0.083% IN NEBU: 2.5000 mg | INHALATION_SOLUTION | RESPIRATORY_TRACT | Status: DC | PRN

## 2021-03-18 MED ORDER — ACETAMINOPHEN 325 MG PO TABS
650.0000 mg | ORAL_TABLET | Freq: Four times a day (QID) | ORAL | Status: DC | PRN
  Administered 2021-03-18 – 2021-03-19 (×2): 650 mg via ORAL
  Filled 2021-03-18 (×2): qty 2

## 2021-03-18 MED ORDER — CLINDAMYCIN PHOSPHATE 600 MG/50ML IV SOLN
600.0000 mg | Freq: Three times a day (TID) | INTRAVENOUS | Status: DC
  Administered 2021-03-18 – 2021-03-20 (×7): 600 mg via INTRAVENOUS
  Filled 2021-03-18 (×8): qty 50

## 2021-03-18 NOTE — ED Notes (Signed)
Requested urine from patient. 

## 2021-03-18 NOTE — Progress Notes (Addendum)
Same day note  Patient seen and examined at bedside.  Patient was admitted to the hospital for right foot cellulitis.  At the time of my evaluation, patient complains of no overt pain fever or chills.  Physical examination reveals right foot with cellulitis blisters purulence and edema with superficial necrotic tissue  Laboratory data and imaging was reviewed  Assessment and Plan.  Severe sepsis due to purulent cellulitis of the right foot.  Continue on broad-spectrum antibiotic.  Plain x-ray did not show gas. Met sepsis criteria with leukocytosis tachycardia tachypnea on presentation.  Continue IV vancomycin and clindamycin.  Follow blood cultures.  Monitor CBC.  Spoke with Dr. Percell Miller orthopedics for possible need for debridement.  Continue pain management.  Denies overt pain at this time    Acute kidney injury:  Mild likely secondary to cellulitis sepsis.  Continue fluids.  Creatinine 1.2 today.  On presentation 1.6.  We will continue to monitor with BMP.  Intake and output charting.   Elevated blood pressure.  Added as needed hydralazine for now.   HIV: Most recent CD4 count noted to be 695 in January 2022.  On biktarvy.  Reports compliance.    Mild persistent asthma: On albuterol inhaler as outpatient.  No exacerbation at this time   No Charge  Signed,  Delila Pereyra, MD [facility]

## 2021-03-18 NOTE — Consult Note (Signed)
ORTHOPAEDIC CONSULTATION  REQUESTING PHYSICIAN: Howerter, Chaney Born, DO  Chief Complaint: Weeping edema and ulceration right foot and ankle.  HPI: David [name] is a 55 y.o. male who presents with a 2-week history of weeping edema and ulceration right foot and ankle dorsally.  Patient denies any recent trauma.  Past Medical History:  Diagnosis Date   Asthma    HIV (human immunodeficiency virus infection) (HCC)    History reviewed. No pertinent surgical history. Social History   Socioeconomic History   Marital status: Single    Spouse name: Not on file   Number of children: Not on file   Years of education: Not on file   Highest education level: Not on file  Occupational History   Not on file  Tobacco Use   Smoking status: Former    Packs/day: 0.25    Types: Cigarettes   Smokeless tobacco: Never  Vaping Use   Vaping Use: Never used  Substance and Sexual Activity   Alcohol use: No   Drug use: No   Sexual activity: Not on file  Other Topics Concern   Not on file  Social History Narrative   ** Merged History Encounter **       Social Determinants of Health   Financial Resource Strain: Not on file  Food Insecurity: Not on file  Transportation Needs: Not on file  Physical Activity: Not on file  Stress: Not on file  Social Connections: Not on file   Family History  Problem Relation Age of Onset   Cancer Mother    - negative except otherwise stated in the family history section Allergies  Allergen Reactions   Penicillins Swelling   Prior to Admission medications   Medication Sig Start Date End Date Taking? Authorizing Provider  albuterol (PROVENTIL) (2.5 MG/3ML) 0.083% nebulizer solution Take 3 mLs (2.5 mg total) by nebulization every 6 (six) hours as needed for wheezing or shortness of breath. 05/22/20  Yes Arnetha Courser, MD  albuterol (VENTOLIN HFA) 108 (90 Base) MCG/ACT inhaler Inhale 2 puffs into the lungs every 6 (six) hours as needed for wheezing  or shortness of breath. 05/22/20  Yes Arnetha Courser, MD  bictegravir-emtricitabine-tenofovir AF (BIKTARVY) 50-200-25 MG TABS tablet Take 1 tablet by mouth daily. 02/07/21  Yes Kuppelweiser, Cassie L, RPH-CPP  clindamycin (CLEOCIN) 300 MG capsule Take 300 mg by mouth 3 (three) times daily. 03/10/21  Yes [provider]  zinc sulfate 220 (50 Zn) MG capsule Take 1 capsule (220 mg total) by mouth daily. 05/23/20  Yes Arnetha Courser, MD  ascorbic acid (VITAMIN C) 500 MG tablet Take 1 tablet (500 mg total) by mouth daily. Patient not taking: Reported on 03/18/2021 05/23/20   Arnetha Courser, MD   DG Ankle Complete Right  Result Date: 03/18/2021 CLINICAL DATA:  Open pustules, infection EXAM: RIGHT FOOT COMPLETE - 3+ VIEW; RIGHT ANKLE - COMPLETE 3+ VIEW COMPARISON:  None. FINDINGS: There is no evidence of fracture or dislocation. No osseous erosion. The joint spaces are preserved. There is no evidence of arthropathy or other focal bone abnormality. Soft tissue nodule overlying the medial malleolus. Soft tissue edema. IMPRESSION: No acute osseous abnormality. Electronically Signed   By: Wiliam Ke M.D.   On: 03/18/2021 01:09   DG Foot Complete Right  Result Date: 03/18/2021 CLINICAL DATA:  Open pustules, infection EXAM: RIGHT FOOT COMPLETE - 3+ VIEW; RIGHT ANKLE - COMPLETE 3+ VIEW COMPARISON:  None. FINDINGS: There is no evidence of fracture or dislocation. No osseous erosion.  The joint spaces are preserved. There is no evidence of arthropathy or other focal bone abnormality. Soft tissue nodule overlying the medial malleolus. Soft tissue edema. IMPRESSION: No acute osseous abnormality. Electronically Signed   By: Wiliam Ke M.D.   On: 03/18/2021 01:09   VAS [place] ABI WITH/WO TBI  Result Date: 03/18/2021  LOWER EXTREMITY DOPPLER STUDY Patient Name:  David [name]  Date of Exam:   03/18/2021 Medical Rec #: [id number]            Accession #:    [id number] Date of Birth: July 10, 1965              Patient Gender: M Patient Age:   30 years Exam Location:  [facility] Procedure:      VAS [place] ABI WITH/WO TBI Referring Phys: MICHAEL JEFFERY --------------------------------------------------------------------------------  Indications: Right foot wound with cellulitis. High Risk Factors: None.  Comparison Study: No prior study Performing Technologist: Gertie Fey MHA, RVT, RDCS, RDMS  Examination Guidelines: A complete evaluation includes at minimum, Doppler waveform signals and systolic blood pressure reading at the level of bilateral brachial, anterior tibial, and posterior tibial arteries, when vessel segments are accessible. Bilateral testing is considered an integral part of a complete examination. Photoelectric Plethysmograph (PPG) waveforms and toe systolic pressure readings are included as required and additional duplex testing as needed. Limited examinations for reoccurring indications may be performed as noted.  ABI Findings: +--------+------------------+-----+---------+--------+ Right   Rt Pressure (mmHg)IndexWaveform Comment  +--------+------------------+-----+---------+--------+ [id number]                    triphasic         +--------+------------------+-----+---------+--------+ PTA     201               1.25 triphasic         +--------+------------------+-----+---------+--------+ DP      168               1.04 triphasic         +--------+------------------+-----+---------+--------+ +--------+------------------+-----+---------+-------+ Left    Lt Pressure (mmHg)IndexWaveform Comment +--------+------------------+-----+---------+-------+ [id number]                    triphasic        +--------+------------------+-----+---------+-------+ PTA     197               1.22 triphasic        +--------+------------------+-----+---------+-------+ DP      201               1.25 triphasic        +--------+------------------+-----+---------+-------+  +-------+-----------+-----------+------------+------------+ ABI/TBIToday's ABIToday's TBIPrevious ABIPrevious TBI +-------+-----------+-----------+------------+------------+ Right  1.25                                           +-------+-----------+-----------+------------+------------+ Left   1.25                                           +-------+-----------+-----------+------------+------------+  Summary: Right: Resting right ankle-brachial index is within normal range. No evidence of significant right lower extremity arterial disease. Left: Resting left ankle-brachial index is within normal range. No evidence of significant left lower extremity arterial disease.  *See table(s) above for measurements and observations.     Preliminary    -  pertinent xrays, CT, MRI studies were reviewed and independently interpreted  Positive ROS: All other systems have been reviewed and were otherwise negative with the exception of those mentioned in the HPI and as above.  Physical Exam: General: Alert, no acute distress Psychiatric: Patient is competent for consent with normal mood and affect Lymphatic: No axillary or cervical lymphadenopathy Cardiovascular: No pedal edema Respiratory: No cyanosis, no use of accessory musculature GI: No organomegaly, abdomen is soft and non-tender    Images:  @ENCIMAGES @  Labs:  Lab Results  Component Value Date   CRP 0.9 05/22/2020    Lab Results  Component Value Date   ALBUMIN 3.1 (L) 03/18/2021   ALBUMIN 4.0 03/18/2021   ALBUMIN 3.9 05/22/2020     CBC EXTENDED Latest Ref Rng & Units 03/18/2021 03/18/2021 05/22/2020  WBC 4.0 - 10.5 K/uL 9.1 12.3(H) 8.3  RBC 4.22 - 5.81 MIL/uL 3.46(L) 3.91(L) 3.84(L)  HGB 13.0 - 17.0 g/dL 12.0(L) 13.6 12.8(L)  HCT 39.0 - 52.0 % 35.6(L) 40.7 38.6(L)  PLT 150 - 400 K/uL 238 293 206  NEUTROABS 1.7 - 7.7 K/uL 5.0 7.0 5.6  LYMPHSABS 0.7 - 4.0 K/uL 2.0 3.2 1.8    Neurologic: Patient does not have protective  sensation bilateral lower extremities.   MUSCULOSKELETAL:   Skin: Examination patient has scaly superficial blisters over the right ankle and foot.  The blisters were popped there is clear fluid.  Beneath the crusty scaly skin there is healthy granulation tissue there is no signs of abscess.  Patient has pitting edema of the entire right lower extremity.  There is no ascending cellulitis.  White blood cell count 9.1 with a hemoglobin of 12.  Albumin 3.1.    Radiographs shows no bony abnormalities.  Patient has a palpable dorsalis pedis and posterior tibial pulse ankle-brachial indices shows triphasic flow.  Assessment: Assessment superficial blistering and ulceration right foot and ankle, possibly staph strep or fungal.  Plan: Agree with continuing the vancomycin and clindamycin until the ulcers are stabilized.  I will write orders for a medical compression stocking to be worn around-the-clock changed twice a day.  Patient is to keep the socks and wash them when he goes home to continue using the socks.  I will follow-up in the office 1 week from Monday.  Thank you for the consult and the opportunity to see Mr. David Sutcliffe, MD [facility] [phone] 5:15 PM

## 2021-03-18 NOTE — Progress Notes (Signed)
ABI completed. Refer to "CV Proc" under chart review to view preliminary results.  03/18/2021 3:10 PM Eula Fried., MHA, RVT, RDCS, RDMS
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
## 2021-03-18 NOTE — Progress Notes (Signed)
Orthopedic Tech Progress Note Patient Details:  David [name] 09/17/1965 [id number] Compression Stockings have been ordered from [employer]  Patient ID: David [name], male   DOB: 1965/10/02, 55 y.o.   MRN: [id number]  David [name] 03/18/2021, 8:05 PM
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
## 2021-03-19 DIAGNOSIS — L03115 Cellulitis of right lower limb: Secondary | ICD-10-CM | POA: Diagnosis not present

## 2021-03-19 DIAGNOSIS — J4530 Mild persistent asthma, uncomplicated: Secondary | ICD-10-CM | POA: Diagnosis not present

## 2021-03-19 DIAGNOSIS — N179 Acute kidney failure, unspecified: Secondary | ICD-10-CM | POA: Diagnosis not present

## 2021-03-19 DIAGNOSIS — B20 Human immunodeficiency virus [HIV] disease: Secondary | ICD-10-CM | POA: Diagnosis not present

## 2021-03-19 LAB — CBC
HCT: 38 % — ABNORMAL LOW (ref 39.0–52.0)
Hemoglobin: 12.5 g/dL — ABNORMAL LOW (ref 13.0–17.0)
MCH: 34.2 pg — ABNORMAL HIGH (ref 26.0–34.0)
MCHC: 32.9 g/dL (ref 30.0–36.0)
MCV: 103.8 fL — ABNORMAL HIGH (ref 80.0–100.0)
Platelets: 245 10*3/uL (ref 150–400)
RBC: 3.66 MIL/uL — ABNORMAL LOW (ref 4.22–5.81)
RDW: 12.2 % (ref 11.5–15.5)
WBC: 8.3 10*3/uL (ref 4.0–10.5)
nRBC: 0 % (ref 0.0–0.2)

## 2021-03-19 LAB — BASIC METABOLIC PANEL
Anion gap: 5 (ref 5–15)
BUN: 15 mg/dL (ref 6–20)
CO2: 25 mmol/L (ref 22–32)
Calcium: 8.3 mg/dL — ABNORMAL LOW (ref 8.9–10.3)
Chloride: 105 mmol/L (ref 98–111)
Creatinine, Ser: 0.98 mg/dL (ref 0.61–1.24)
GFR, Estimated: >60 mL/min (ref >60)
Glucose, Bld: 105 mg/dL — ABNORMAL HIGH (ref 70–99)
Potassium: 3.8 mmol/L (ref 3.5–5.1)
Sodium: 135 mmol/L (ref 135–145)

## 2021-03-19 LAB — MAGNESIUM: Magnesium: 1.9 mg/dL (ref 1.7–2.4)

## 2021-03-19 MED ORDER — DIPHENHYDRAMINE HCL 25 MG PO CAPS
25.0000 mg | ORAL_CAPSULE | Freq: Four times a day (QID) | ORAL | Status: DC | PRN
Start: 2021-03-19 — End: 2021-03-20
  Administered 2021-03-19 (×2): 25 mg via ORAL
  Filled 2021-03-19 (×2): qty 1

## 2021-03-19 MED ORDER — VANCOMYCIN HCL 1500 MG/300ML IV SOLN
1500.0000 mg | Freq: Two times a day (BID) | INTRAVENOUS | Status: DC
  Administered 2021-03-19 – 2021-03-20 (×2): 1500 mg via INTRAVENOUS
  Filled 2021-03-19 (×2): qty 300
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
## 2021-03-19 NOTE — Progress Notes (Signed)
PROGRESS NOTE  David [name] IHK:[id number] DOB: December 20, 1965 DOA: 03/17/2021 PCP: Pcp, No   LOS: 1 day   Brief narrative:  David [name] is a 55 y.o. male with medical history significant for HIV, presented to hospital with 10-day history of redness swelling of his medial and dorsal aspect of the right foot with some purulent drainage and tenderness.  There was no history of overt trauma but had started with a blister.  Patient does have a history of HIV and is compliant with her [place] Odor and is most recent CD4 count was 695 in January 2022.  In the ED, patient was mildly tachycardic with mild tachypnea.  Labs were remarkable for elevated creatinine at 1.6 and leukocytosis at 12,300.  COVID and flu was negative.  X-ray of the foot did not show any evidence of osteomyelitis or subcutaneous gas but the soft tissue edema.  Patient was given IV vancomycin and clindamycin and was admitted to hospital for further evaluation and treatment.   Assessment/Plan:  Principal Problem:   Cellulitis of right foot Active Problems:   Asthma   HIV disease ([place])   Severe sepsis ([place])   AKI (acute kidney injury) ([place])   Severe sepsis due to purulent cellulitis of the right foot.  X-ray of the foot did not show any evidence of gas.  Patient had leukocytosis, tachycardia, tachypnea on presentation and met sepsis criteria.  Continue IV vancomycin and clindamycin.  Patient has been seen by orthopedic Dr Sharol Given who recommended compression bandage and IV antibiotic.  Patient is afebrile at this time.   Acute kidney injury:  Mild on presentation.  Improved with IV fluids.   Elevated blood pressure.  Likely essential hypertension.  Patient might benefit from oral antihypertensives on discharge.   HIV: Most recent CD4 count noted to be 695 in January 2022.  On biktarvy.  Will need to continue on discharge    Mild persistent asthma: On albuterol inhaler as outpatient.  Continue albuterol nebulizer.  DVT  prophylaxis: SCDs Start: 03/18/21 0227   Code Status: Full code  Family Communication:  Spoke with the patient at bedside.  Status is: Inpatient  Remains inpatient appropriate because: Need for IV antibiotic  Consultants: Orthopedics  Procedures: None  Anti-infectives:  Vancomycin Clindamycin Biktarvy  Anti-infectives (From admission, onward)    Start     Dose/Rate Route Frequency Ordered Stop   03/19/21 1800  vancomycin (VANCOREADY) IVPB 1500 mg/300 mL        1,500 mg 150 mL/hr over 120 Minutes Intravenous Every 12 hours 03/19/21 0934     03/19/21 0200  vancomycin (VANCOREADY) IVPB 1750 mg/350 mL  Status:  Discontinued        1,750 mg 175 mL/hr over 120 Minutes Intravenous Every 24 hours 03/18/21 0435 03/19/21 0934   03/18/21 1000  bictegravir-emtricitabine-tenofovir AF (BIKTARVY) 50-200-25 MG per tablet 1 tablet        1 tablet Oral Daily 03/18/21 0416     03/18/21 0600  clindamycin (CLEOCIN) IVPB 600 mg        600 mg 100 mL/hr over 30 Minutes Intravenous Every 8 hours 03/18/21 0419     03/18/21 0115  vancomycin (VANCOREADY) IVPB 2000 mg/400 mL        2,000 mg 200 mL/hr over 120 Minutes Intravenous  Once 03/18/21 0101 03/18/21 0332   03/18/21 0100  clindamycin (CLEOCIN) IVPB 900 mg        900 mg 100 mL/hr over 30 Minutes Intravenous  Once 03/18/21 0051 03/18/21  0207       Subjective: Today, patient was seen and examined at bedside.  Patient denies overt pain, fever or chills.  Objective: Vitals:   03/19/21 0040 03/19/21 0616  BP: 138/89 (!) 140/92  Pulse: 97 81  Resp: 18 16  Temp: 97.9 F (36.6 C) 98.4 F (36.9 C)  SpO2: 96% 94%    Intake/Output Summary (Last 24 hours) at 03/19/2021 1322 Last data filed at 03/19/2021 1320 Gross per 24 hour  Intake 3161.55 ml  Output 2600 ml  Net 561.55 ml   Filed Weights   03/17/21 2353 03/18/21 2041 03/19/21 0500  Weight: 97.5 kg 94.5 kg 93.3 kg   Body mass index is 27.14 kg/m.   Physical Exam: GENERAL:  Patient is alert awake and oriented. Not in obvious distress. HENT: No scleral pallor or icterus. Pupils equally reactive to light. Oral mucosa is moist NECK: is supple, no gross swelling noted. CHEST: Clear to auscultation. No crackles or wheezes.  Diminished breath sounds bilaterally. CVS: S1 and S2 heard, no murmur. Regular rate and rhythm.  ABDOMEN: Soft, non-tender, bowel sounds are present. EXTREMITIES: Right foot covered with dressing.  Dry toes.  Capillary refill present. CNS: Cranial nerves are intact. No focal motor deficits. SKIN: warm and dry, right foot with dressing.  Data Review: I have personally reviewed the following laboratory data and studies,  CBC: Recent Labs  Lab 03/18/21 0051 03/18/21 0751 03/19/21 0335  WBC 12.3* 9.1 8.3  NEUTROABS 7.0 5.0  --   HGB 13.6 12.0* 12.5*  HCT 40.7 35.6* 38.0*  MCV 104.1* 102.9* 103.8*  PLT 293 238 374   Basic Metabolic Panel: Recent Labs  Lab 03/18/21 0051 03/18/21 0054 03/18/21 0751 03/19/21 0335  NA 132*  --  134* 135  K 3.8  --  3.6 3.8  CL 100  --  105 105  CO2 24  --  24 25  GLUCOSE 98  --  137* 105*  BUN 19  --  17 15  CREATININE 1.63*  --  1.25* 0.98  CALCIUM 8.4*  --  8.0* 8.3*  MG  --  1.9  --  1.9   Liver Function Tests: Recent Labs  Lab 03/18/21 0051 03/18/21 0751  AST 24 22  ALT 27 22  ALKPHOS 91 71  BILITOT 0.9 0.9  PROT 8.5* 6.9  ALBUMIN 4.0 3.1*   No results for input(s): LIPASE, AMYLASE in the last 168 hours. No results for input(s): AMMONIA in the last 168 hours. Cardiac Enzymes: No results for input(s): CKTOTAL, CKMB, CKMBINDEX, TROPONINI in the last 168 hours. BNP (last 3 results) Recent Labs    05/22/20 0039  BNP 33.2    ProBNP (last 3 results) No results for input(s): PROBNP in the last 8760 hours.  CBG: No results for input(s): GLUCAP in the last 168 hours. Recent Results (from the past 240 hour(s))  Blood culture (routine single)     Status: None (Preliminary result)    Collection Time: 03/18/21 12:51 AM   Specimen: BLOOD  Result Value Ref Range Status   Specimen Description   Final    BLOOD LEFT ANTECUBITAL Performed at [facility] [address] [place], [place] [address]    Special Requests   Final    BOTTLES DRAWN AEROBIC AND ANAEROBIC Blood Culture adequate volume Performed at [facility] [address], [place], [place] [address]    Culture   Final    NO GROWTH 1 DAY Performed at  [facility], [place] [address] [place], [place] [address]    Report Status PENDING  Incomplete  Resp Panel by RT-PCR (Flu A&B, Covid) Nasopharyngeal Swab     Status: None   Collection Time: 03/18/21 12:52 AM   Specimen: Nasopharyngeal Swab; Nasopharyngeal(NP) swabs in vial transport medium  Result Value Ref Range Status   SARS Coronavirus 2 by RT PCR NEGATIVE NEGATIVE Final    Comment: (NOTE) SARS-CoV-2 target nucleic acids are NOT DETECTED.  The SARS-CoV-2 RNA is generally detectable in upper respiratory specimens during the acute phase of infection. The lowest concentration of SARS-CoV-2 viral copies this assay can detect is 138 copies/mL. A negative result does not preclude SARS-Cov-2 infection and should not be used as the sole basis for treatment or other patient management decisions. A negative result may occur with  improper specimen collection/handling, submission of specimen other than nasopharyngeal swab, presence of viral mutation(s) within the areas targeted by this assay, and inadequate number of viral copies(<138 copies/mL). A negative result must be combined with clinical observations, patient history, and epidemiological information. The expected result is Negative.  Fact Sheet for Patients:  EntrepreneurPulse.com.au  Fact Sheet for Healthcare Providers:  IncredibleEmployment.be  This test is no t yet approved or cleared by the [place] FDA and  has been  authorized for detection and/or diagnosis of SARS-CoV-2 by FDA under an Emergency Use Authorization (EUA). This EUA will remain  in effect (meaning this test can be used) for the duration of the COVID-19 declaration under Section 564(b)(1) of the Act, 21 U.S.C.section 360bbb-3(b)(1), unless the authorization is terminated  or revoked sooner.       Influenza A by PCR NEGATIVE NEGATIVE Final   Influenza B by PCR NEGATIVE NEGATIVE Final    Comment: (NOTE) The Xpert Xpress SARS-CoV-2/FLU/RSV plus assay is intended as an aid in the diagnosis of influenza from Nasopharyngeal swab specimens and should not be used as a sole basis for treatment. Nasal washings and aspirates are unacceptable for Xpert Xpress SARS-CoV-2/FLU/RSV testing.  Fact Sheet for Patients: EntrepreneurPulse.com.au  Fact Sheet for Healthcare Providers: IncredibleEmployment.be  This test is not yet approved or cleared by the [place] FDA and has been authorized for detection and/or diagnosis of SARS-CoV-2 by FDA under an Emergency Use Authorization (EUA). This EUA will remain in effect (meaning this test can be used) for the duration of the COVID-19 declaration under Section 564(b)(1) of the Act, 21 U.S.C. section 360bbb-3(b)(1), unless the authorization is terminated or revoked.  Performed at [facility], [place] [address], [place], [place] [address]      Studies: DG Ankle Complete Right  Result Date: 03/18/2021 CLINICAL DATA:  Open pustules, infection EXAM: RIGHT FOOT COMPLETE - 3+ VIEW; RIGHT ANKLE - COMPLETE 3+ VIEW COMPARISON:  None. FINDINGS: There is no evidence of fracture or dislocation. No osseous erosion. The joint spaces are preserved. There is no evidence of arthropathy or other focal bone abnormality. Soft tissue nodule overlying the medial malleolus. Soft tissue edema. IMPRESSION: No acute osseous abnormality. Electronically Signed   By: Merilyn Baba M.D.   On: 03/18/2021 01:09   DG Foot Complete Right  Result Date: 03/18/2021 CLINICAL DATA:  Open pustules, infection EXAM: RIGHT FOOT COMPLETE - 3+ VIEW; RIGHT ANKLE - COMPLETE 3+ VIEW COMPARISON:  None. FINDINGS: There is no evidence of fracture or dislocation. No osseous erosion. The joint spaces are preserved. There is no evidence of arthropathy or other focal bone abnormality. Soft tissue nodule  overlying the medial malleolus. Soft tissue edema. IMPRESSION: No acute osseous abnormality. Electronically Signed   By: Merilyn Baba M.D.   On: 03/18/2021 01:09   VAS [place] ABI WITH/WO TBI  Result Date: 03/19/2021  LOWER EXTREMITY DOPPLER STUDY Patient Name:  Jusiah Aguayo  Date of Exam:   03/18/2021 Medical Rec #: [id number]            Accession #:    [id number] Date of Birth: [birth year]             Patient Gender: M Patient Age:   2 years Exam Location:  [facility] Procedure:      VAS [place] ABI WITH/WO TBI Referring Phys: MICHAEL JEFFERY --------------------------------------------------------------------------------  Indications: Right foot wound with cellulitis. High Risk Factors: None.  Comparison Study: No prior study Performing Technologist: Maudry Mayhew MHA, RVT, RDCS, RDMS  Examination Guidelines: A complete evaluation includes at minimum, Doppler waveform signals and systolic blood pressure reading at the level of bilateral brachial, anterior tibial, and posterior tibial arteries, when vessel segments are accessible. Bilateral testing is considered an integral part of a complete examination. Photoelectric Plethysmograph (PPG) waveforms and toe systolic pressure readings are included as required and additional duplex testing as needed. Limited examinations for reoccurring indications may be performed as noted.  ABI Findings: +--------+------------------+-----+---------+--------+ Right   Rt Pressure (mmHg)IndexWaveform Comment   +--------+------------------+-----+---------+--------+ [id number]                    triphasic         +--------+------------------+-----+---------+--------+ PTA     201               1.25 triphasic         +--------+------------------+-----+---------+--------+ DP      168               1.04 triphasic         +--------+------------------+-----+---------+--------+ +--------+------------------+-----+---------+-------+ Left    Lt Pressure (mmHg)IndexWaveform Comment +--------+------------------+-----+---------+-------+ [id number]                    triphasic        +--------+------------------+-----+---------+-------+ PTA     197               1.22 triphasic        +--------+------------------+-----+---------+-------+ DP      201               1.25 triphasic        +--------+------------------+-----+---------+-------+ +-------+-----------+-----------+------------+------------+ ABI/TBIToday's ABIToday's TBIPrevious ABIPrevious TBI +-------+-----------+-----------+------------+------------+ Right  1.25                                           +-------+-----------+-----------+------------+------------+ Left   1.25                                           +-------+-----------+-----------+------------+------------+  Summary: Right: Resting right ankle-brachial index is within normal range. No evidence of significant right lower extremity arterial disease. Left: Resting left ankle-brachial index is within normal range. No evidence of significant left lower extremity arterial disease.  *See table(s) above for measurements and observations.  Electronically signed by Deitra Mayo MD on 03/19/2021 at 5:14:37 AM.    Final       Flora Lipps, MD  [facility] 03/19/2021  If 7PM-7AM, please contact night-coverage

## 2021-03-19 NOTE — Consult Note (Addendum)
WOC Nurse Consult Note: Reason for Consult: Right foot blisters and cellulitis Wound type:infectious Pressure Injury POA: N/A  WOC Nursing is simultaneously consulted with Orthopedics and Dr. Lajoyce Corners has already seen patient last evening.  Dr. Lajoyce Corners indicates that he will write for compression stockings to be worn around the clock and changed twice daily to the affected limb and indicates that he will follow post discharge. Systemic antibiotics are on board. WOC Nursing will provide guidance for pressure injury prevention.  WOC nursing team will not follow, but will remain available to this patient, the nursing and medical teams.  Please re-consult if needed. Thanks, Ladona Mow, MSN, RN, GNP, [place]  Pager# [phone]

## 2021-03-19 NOTE — Plan of Care (Signed)
  Problem: Education: Goal: Knowledge of General Education information will improve Description Including pain rating scale, medication(s)/side effects and non-pharmacologic comfort measures Outcome: Progressing   

## 2021-03-20 DIAGNOSIS — B20 Human immunodeficiency virus [HIV] disease: Secondary | ICD-10-CM | POA: Diagnosis not present

## 2021-03-20 DIAGNOSIS — L03115 Cellulitis of right lower limb: Secondary | ICD-10-CM | POA: Diagnosis not present

## 2021-03-20 DIAGNOSIS — J4530 Mild persistent asthma, uncomplicated: Secondary | ICD-10-CM | POA: Diagnosis not present

## 2021-03-20 DIAGNOSIS — N179 Acute kidney failure, unspecified: Secondary | ICD-10-CM | POA: Diagnosis not present

## 2021-03-20 LAB — CBC
HCT: 38.7 % — ABNORMAL LOW (ref 39.0–52.0)
Hemoglobin: 12.8 g/dL — ABNORMAL LOW (ref 13.0–17.0)
MCH: 34.2 pg — ABNORMAL HIGH (ref 26.0–34.0)
MCHC: 33.1 g/dL (ref 30.0–36.0)
MCV: 103.5 fL — ABNORMAL HIGH (ref 80.0–100.0)
Platelets: 238 10*3/uL (ref 150–400)
RBC: 3.74 MIL/uL — ABNORMAL LOW (ref 4.22–5.81)
RDW: 12.2 % (ref 11.5–15.5)
WBC: 8.5 10*3/uL (ref 4.0–10.5)
nRBC: 0 % (ref 0.0–0.2)

## 2021-03-20 LAB — MAGNESIUM: Magnesium: 1.7 mg/dL (ref 1.7–2.4)

## 2021-03-20 LAB — BASIC METABOLIC PANEL
Anion gap: 7 (ref 5–15)
BUN: 13 mg/dL (ref 6–20)
CO2: 22 mmol/L (ref 22–32)
Calcium: 8.2 mg/dL — ABNORMAL LOW (ref 8.9–10.3)
Chloride: 104 mmol/L (ref 98–111)
Creatinine, Ser: 1.24 mg/dL (ref 0.61–1.24)
GFR, Estimated: >60 mL/min (ref >60)
Glucose, Bld: 89 mg/dL (ref 70–99)
Potassium: 3.7 mmol/L (ref 3.5–5.1)
Sodium: 133 mmol/L — ABNORMAL LOW (ref 135–145)

## 2021-03-20 MED ORDER — VANCOMYCIN HCL 2000 MG/400ML IV SOLN: 2000.0000 mg | INTRAVENOUS | Status: DC

## 2021-03-20 MED ORDER — DOXYCYCLINE HYCLATE 100 MG PO TABS: 100.0000 mg | ORAL_TABLET | Freq: Two times a day (BID) | ORAL | 0 refills | Status: AC

## 2021-03-20 NOTE — Progress Notes (Signed)
Orthopedic Tech Progress Note Patient Details:  David [name] [birth year] [id number]  Patient ID: David [name], male   DOB: 08-06-1965, 55 y.o.   MRN: [id number]  David [name] 03/20/2021, 9:15 AMCalled and routed Bilateral compression stocking to [facility].
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
## 2021-03-20 NOTE — Discharge Summary (Signed)
Physician Discharge Summary  David [name] KPT:[id number] DOB: 1966-03-08 DOA: 03/17/2021  PCP: Pcp, No  Admit date: 03/17/2021 Discharge date: 03/20/2021  Admitted From: Home  Discharge disposition: Home.  Recommendations for Outpatient Follow-Up:   Follow up with your primary care provider in one week.  Check CBC, BMP, magnesium in the next visit Follow-up with Dr Sharol Given orthopedics in 1 week.  Discharge Diagnosis:   Principal Problem:   Cellulitis of right foot Active Problems:   Asthma   HIV disease ([place])   Severe sepsis ([place])   AKI (acute kidney injury) ([place])   Discharge Condition: Improved.  Diet recommendation: Low sodium, heart healthy.    Wound care: Local foot care  Code status: Full.   History of Present Illness:   David [name] is a 55 y.o. male with medical history significant for HIV, presented to hospital with 10-day history of redness swelling of his medial and dorsal aspect of the right foot with some purulent drainage and tenderness.  There was no history of overt trauma but had started with a blister.  Patient does have a history of HIV and is compliant with her [place] and is most recent CD4 count was 695 in January 2022.  In the ED, patient was mildly tachycardic with mild tachypnea.  Labs were remarkable for elevated creatinine at 1.6 and leukocytosis at 12,300.  COVID and flu was negative.  X-ray of the foot did not show any evidence of osteomyelitis or subcutaneous gas but the soft tissue edema.  Patient was given IV vancomycin and clindamycin and was admitted to hospital for further evaluation and treatment.  Hospital Course:   Following conditions were addressed during hospitalization as listed below,  Severe sepsis due to purulent cellulitis of the right foot.  X-ray of the foot did not show any evidence of gas.  Patient had leukocytosis, tachycardia, tachypnea on presentation and met sepsis criteria.  Patient received IV  vancomycin and clindamycin during hospitalization..  Patient was seen by orthopedic Dr Sharol Given who recommended compression bandage and IV antibiotic.  Patient is afebrile at this time.  At this time, patient has received 2 days of IV antibiotic.  This will be transition to oral doxycycline on discharge for next 7 days.  Patient has been advised elastic compression bandage on discharge.  Dr Sharol Given orthopedics will follow the patient in 1 week.  Communicated with Dr Sharol Given prior to discharge    Acute kidney injury:  Mild on presentation.  Improved with IV fluids.  Creatinine prior to discharge was 1.2.   Elevated blood pressure.  Possible hypertension but difficult to discern.  Would consider oral antihypertensive if still elevated on the next PCP visit.   HIV: Most recent CD4 count noted to be 695 in January 2022.  On biktarvy.  Will need to continue on discharge    Mild persistent asthma: On albuterol inhaler as outpatient.    Disposition.  At this time, patient is stable for disposition home with outpatient PCP and orthopedic follow-up.  Medical Consultants:   Orthopedics Dr Sharol Given  Procedures:    None Subjective:   Today, patient was seen and examined at bedside.  Denies overt pain nausea vomiting fever or chills.  Discharge Exam:   Vitals:   03/19/21 2126 03/20/21 0528  BP: (!) 151/97 136/81  Pulse: 92 97  Resp: 18 18  Temp: 98.8 F (37.1 C) 98.5 F (36.9 C)  SpO2: 92% 96%   Vitals:   03/19/21 0616 03/19/21 2126 03/20/21  0500 03/20/21 0528  BP: (!) 140/92 (!) 151/97  136/81  Pulse: 81 92  97  Resp: _0 Temp: 98.4 F (36.9 C) 98.8 F (37.1 C)  98.5 F (36.9 C)  TempSrc: Oral Oral  Oral  SpO2: 94% 92%  96%  Weight:   96.6 kg   Height:       Body mass index is 28.1 kg/m.  General: Alert awake, not in obvious distress HENT: pupils equally reacting to light,  No scleral pallor or icterus noted. Oral mucosa is moist.  Chest:  Clear breath sounds.  Diminished breath  sounds bilaterally. No crackles or wheezes.  CVS: S1 &S2 heard. No murmur.  Regular rate and rhythm. Abdomen: Soft, nontender, nondistended.  Bowel sounds are heard.   Extremities: No cyanosis, clubbing.  Right foot with dressing. Psych: Alert, awake and oriented, normal mood CNS:  No cranial nerve deficits.  Power equal in all extremities.   Skin: Warm and dry.  Right foot with dressing.  The results of significant diagnostics from this hospitalization (including imaging, microbiology, ancillary and laboratory) are listed below for reference.     Diagnostic Studies:   DG Ankle Complete Right  Result Date: 03/18/2021 CLINICAL DATA:  Open pustules, infection EXAM: RIGHT FOOT COMPLETE - 3+ VIEW; RIGHT ANKLE - COMPLETE 3+ VIEW COMPARISON:  None. FINDINGS: There is no evidence of fracture or dislocation. No osseous erosion. The joint spaces are preserved. There is no evidence of arthropathy or other focal bone abnormality. Soft tissue nodule overlying the medial malleolus. Soft tissue edema. IMPRESSION: No acute osseous abnormality. Electronically Signed   By: Merilyn Baba M.D.   On: 03/18/2021 01:09   DG Foot Complete Right  Result Date: 03/18/2021 CLINICAL DATA:  Open pustules, infection EXAM: RIGHT FOOT COMPLETE - 3+ VIEW; RIGHT ANKLE - COMPLETE 3+ VIEW COMPARISON:  None. FINDINGS: There is no evidence of fracture or dislocation. No osseous erosion. The joint spaces are preserved. There is no evidence of arthropathy or other focal bone abnormality. Soft tissue nodule overlying the medial malleolus. Soft tissue edema. IMPRESSION: No acute osseous abnormality. Electronically Signed   By: Merilyn Baba M.D.   On: 03/18/2021 01:09   VAS [place] ABI WITH/WO TBI  Result Date: 03/19/2021  LOWER EXTREMITY DOPPLER STUDY Patient Name:  David [name]  Date of Exam:   03/18/2021 Medical Rec #: [id number]            Accession #:    [id number] Date of Birth: 02-Mar-1966             Patient Gender: M Patient  Age:   55 years Exam [facility]  [facility] Procedure:      VAS [place] ABI WITH/WO TBI Referring Phys: MICHAEL JEFFERY --------------------------------------------------------------------------------  Indications: Right foot wound with cellulitis. High Risk Factors: None.  Comparison Study: No prior study Performing Technologist: Maudry Mayhew MHA, RVT, RDCS, RDMS  Examination Guidelines: A complete evaluation includes at minimum, Doppler waveform signals and systolic blood pressure reading at the level of bilateral brachial, anterior tibial, and posterior tibial arteries, when vessel segments are accessible. Bilateral testing is considered an integral part of a complete examination. Photoelectric Plethysmograph (PPG) waveforms and toe systolic pressure readings are included as required and additional duplex testing as needed. Limited examinations for reoccurring indications may be performed as noted.  ABI Findings: +--------+------------------+-----+---------+--------+ Right   Rt Pressure (mmHg)IndexWaveform Comment  +--------+------------------+-----+---------+--------+ [id number]  triphasic         +--------+------------------+-----+---------+--------+ PTA     201               1.25 triphasic         +--------+------------------+-----+---------+--------+ DP      168               1.04 triphasic         +--------+------------------+-----+---------+--------+ +--------+------------------+-----+---------+-------+ Left    Lt Pressure (mmHg)IndexWaveform Comment +--------+------------------+-----+---------+-------+ [id number]                    triphasic        +--------+------------------+-----+---------+-------+ PTA     197               1.22 triphasic        +--------+------------------+-----+---------+-------+ DP      201               1.25 triphasic        +--------+------------------+-----+---------+-------+  +-------+-----------+-----------+------------+------------+ ABI/TBIToday's ABIToday's TBIPrevious ABIPrevious TBI +-------+-----------+-----------+------------+------------+ Right  1.25                                           +-------+-----------+-----------+------------+------------+ Left   1.25                                           +-------+-----------+-----------+------------+------------+  Summary: Right: Resting right ankle-brachial index is within normal range. No evidence of significant right lower extremity arterial disease. Left: Resting left ankle-brachial index is within normal range. No evidence of significant left lower extremity arterial disease.  *See table(s) above for measurements and observations.  Electronically signed by Deitra Mayo MD on 03/19/2021 at 5:14:37 AM.    Final      Labs:   Basic Metabolic Panel: Recent Labs  Lab 03/18/21 0051 03/18/21 0054 03/18/21 0751 03/19/21 0335 03/20/21 0420  NA 132*  --  134* 135 133*  K 3.8  --  3.6 3.8 3.7  CL 100  --  105 105 104  CO2 24  --  _0 GLUCOSE 98  --  137* 105* 89  BUN 19  --  _1 CREATININE 1.63*  --  1.25* 0.98 1.24  CALCIUM 8.4*  --  8.0* 8.3* 8.2*  MG  --  1.9  --  1.9 1.7   GFR Estimated Creatinine Clearance: 81.5 mL/min (by C-G formula based on SCr of 1.24 mg/dL). Liver Function Tests: Recent Labs  Lab 03/18/21 0051 03/18/21 0751  AST 24 22  ALT 27 22  ALKPHOS 91 71  BILITOT 0.9 0.9  PROT 8.5* 6.9  ALBUMIN 4.0 3.1*   No results for input(s): LIPASE, AMYLASE in the last 168 hours. No results for input(s): AMMONIA in the last 168 hours. Coagulation profile Recent Labs  Lab 03/18/21 0051  INR 1.0    CBC: Recent Labs  Lab 03/18/21 0051 03/18/21 0751 03/19/21 0335 03/20/21 0420  WBC 12.3* 9.1 8.3 8.5  NEUTROABS 7.0 5.0  --   --   HGB 13.6 12.0* 12.5* 12.8*  HCT 40.7 35.6* 38.0* 38.7*  MCV 104.1* 102.9* 103.8* 103.5*  PLT 293 238 245 238    Cardiac Enzymes: No results for input(s): CKTOTAL, CKMB, CKMBINDEX, TROPONINI in  the last 168 hours. BNP: Invalid input(s): POCBNP CBG: No results for input(s): GLUCAP in the last 168 hours. D-Dimer No results for input(s): DDIMER in the last 72 hours. Hgb A1c No results for input(s): HGBA1C in the last 72 hours. Lipid Profile No results for input(s): CHOL, HDL, LDLCALC, TRIG, CHOLHDL, LDLDIRECT in the last 72 hours. Thyroid function studies No results for input(s): TSH, T4TOTAL, T3FREE, THYROIDAB in the last 72 hours.  Invalid input(s): FREET3 Anemia work up No results for input(s): VITAMINB12, FOLATE, FERRITIN, TIBC, IRON, RETICCTPCT in the last 72 hours. Microbiology Recent Results (from the past 240 hour(s))  Blood culture (routine single)     Status: None (Preliminary result)   Collection Time: 03/18/21 12:51 AM   Specimen: BLOOD  Result Value Ref Range Status   Specimen Description   Final    BLOOD LEFT ANTECUBITAL Performed at [facility] [address] [place], [place] [address]    Special Requests   Final    BOTTLES DRAWN AEROBIC AND ANAEROBIC Blood Culture adequate volume Performed at [facility] [address] [place], [place] [address]    Culture   Final    NO GROWTH 1 DAY Performed at [facility], [place] [address] [place], [place] [address]    Report Status PENDING  Incomplete  Resp Panel by RT-PCR (Flu A&B, Covid) Nasopharyngeal Swab     Status: None   Collection Time: 03/18/21 12:52 AM   Specimen: Nasopharyngeal Swab; Nasopharyngeal(NP) swabs in vial transport medium  Result Value Ref Range Status   SARS Coronavirus 2 by RT PCR NEGATIVE NEGATIVE Final    Comment: (NOTE) SARS-CoV-2 target nucleic acids are NOT DETECTED.  The SARS-CoV-2 RNA is generally detectable in upper respiratory specimens during the acute phase of infection. The lowest concentration of SARS-CoV-2 viral copies this assay can detect  is 138 copies/mL. A negative result does not preclude SARS-Cov-2 infection and should not be used as the sole basis for treatment or other patient management decisions. A negative result may occur with  improper specimen collection/handling, submission of specimen other than nasopharyngeal swab, presence of viral mutation(s) within the areas targeted by this assay, and inadequate number of viral copies(<138 copies/mL). A negative result must be combined with clinical observations, patient history, and epidemiological information. The expected result is Negative.  Fact Sheet for Patients:  EntrepreneurPulse.com.au  Fact Sheet for Healthcare Providers:  IncredibleEmployment.be  This test is no t yet approved or cleared by the [place] FDA and  has been authorized for detection and/or diagnosis of SARS-CoV-2 by FDA under an Emergency Use Authorization (EUA). This EUA will remain  in effect (meaning this test can be used) for the duration of the COVID-19 declaration under Section 564(b)(1) of the Act, 21 U.S.C.section 360bbb-3(b)(1), unless the authorization is terminated  or revoked sooner.       Influenza A by PCR NEGATIVE NEGATIVE Final   Influenza B by PCR NEGATIVE NEGATIVE Final    Comment: (NOTE) The Xpert Xpress SARS-CoV-2/FLU/RSV plus assay is intended as an aid in the diagnosis of influenza from Nasopharyngeal swab specimens and should not be used as a sole basis for treatment. Nasal washings and aspirates are unacceptable for Xpert Xpress SARS-CoV-2/FLU/RSV testing.  Fact Sheet for Patients: EntrepreneurPulse.com.au  Fact Sheet for Healthcare Providers: IncredibleEmployment.be  This test is not yet approved or cleared by the [place] FDA and has been authorized for detection and/or diagnosis of SARS-CoV-2 by FDA under an Emergency  Use Authorization (EUA). This EUA will remain in effect  (meaning this test can be used) for the duration of the COVID-19 declaration under Section 564(b)(1) of the Act, 21 U.S.C. section 360bbb-3(b)(1), unless the authorization is terminated or revoked.  Performed at [facility], [place] [address] [place], [place] [address]      Discharge Instructions:   Discharge Instructions     Call MD for:  severe uncontrolled pain   Complete by: As directed    Call MD for:  temperature >100.4   Complete by: As directed    Diet general   Complete by: As directed    Discharge instructions   Complete by: As directed    Complete the course of antibiotic.  Follow-up with orthopedics Dr Sharol Given in one week. Use elastic compression stockings at home 24/7 and change twice a day. Try not to break the blisters.   Increase activity slowly   Complete by: As directed       Allergies as of 03/20/2021       Reactions   Penicillins Swelling        Medication List     TAKE these medications    albuterol (2.5 MG/3ML) 0.083% nebulizer solution Commonly known as: PROVENTIL Take 3 mLs (2.5 mg total) by nebulization every 6 (six) hours as needed for wheezing or shortness of breath.   albuterol 108 (90 Base) MCG/ACT inhaler Commonly known as: VENTOLIN HFA Inhale 2 puffs into the lungs every 6 (six) hours as needed for wheezing or shortness of breath.   ascorbic acid 500 MG tablet Commonly known as: VITAMIN C Take 1 tablet (500 mg total) by mouth daily.   Biktarvy 50-200-25 MG Tabs tablet Generic drug: bictegravir-emtricitabine-tenofovir AF Take 1 tablet by mouth daily.   clindamycin 300 MG capsule Commonly known as: CLEOCIN Take 300 mg by mouth 3 (three) times daily.   doxycycline 100 MG tablet Commonly known as: VIBRA-TABS Take 1 tablet (100 mg total) by mouth 2 (two) times daily for 7 days.   zinc sulfate 220 (50 Zn) MG capsule Take 1 capsule (220 mg total) by mouth daily.        Follow-up Information     Newt Minion, MD. Call in 1 week(s).   Specialty: Orthopedic Surgery Contact information: [place]  [address] [phone]                 Time coordinating discharge: 39 minutes  Signed:  Naryiah Schley  [facility] 03/20/2021, 11:10 AM

## 2021-03-23 LAB — CULTURE, BLOOD (SINGLE)
Culture: NO GROWTH
Special Requests: ADEQUATE

## 2021-03-28 ENCOUNTER — Other Ambulatory Visit: Payer: Self-pay

## 2021-03-28 ENCOUNTER — Ambulatory Visit (INDEPENDENT_AMBULATORY_CARE_PROVIDER_SITE_OTHER): Payer: BC Managed Care – PPO | Admitting: Orthopedic Surgery

## 2021-03-28 ENCOUNTER — Encounter: Payer: Self-pay | Admitting: Orthopedic Surgery

## 2021-03-28 DIAGNOSIS — L03115 Cellulitis of right lower limb: Secondary | ICD-10-CM | POA: Diagnosis not present

## 2021-03-28 MED ORDER — SULFAMETHOXAZOLE-TRIMETHOPRIM 800-160 MG PO TABS: 1.0000 | ORAL_TABLET | Freq: Two times a day (BID) | ORAL | 0 refills | Status: DC

## 2021-03-28 NOTE — Progress Notes (Addendum)
Office Visit Note   Patient: David [name]           Date of Birth: [birth year]           MRN: [id number] Visit Date: 03/28/2021              Requested by: No referring provider defined for this encounter. PCP: Pcp, No  Chief Complaint  Patient presents with   Left Foot - Follow-up      HPI: Patient is a 55 year old gentleman who presents in follow-up for a blistering rash that involves his upper extremities and lower extremities.  He states the rash on his hands is dried up and is not symptomatic he states the foot blistering rash is improving but it is itching.  He has been on doxycycline.  Patient states the blisters will dry up and then return.  Assessment & Plan: Visit Diagnoses:  1. Cellulitis of right foot     Plan: Recommended antifungal powder such as athlete's foot powder to help dry out these blisters continue with the compression stockings.  A prescription was called in for Bactrim DS reevaluate in 1 week.  In review the blisters and ulcers should have resolved significantly better with antibiotics if this was bacterial and should have resolved much better if this was fungal with the medicated compression socks.  The other possibility is that this is an autoimmune blister reaction.  We will call in prednisone to start 20 mg with breakfast and reevaluate next week.  Follow-Up Instructions: Return in about 1 week (around 04/04/2021).   Ortho Exam  Patient is alert, oriented, no adenopathy, well-dressed, normal affect, normal respiratory effort. Examination patient has a palpable pulse the Doppler was used and he has a triphasic dorsalis pedis and posterior tibial pulse no arterial insufficiency.  The rash is completely dried up on his hands the rash has decreased blistering on the right foot but still has some axillary canisters with clear serous fluid.  There is no cellulitis he does have venous swelling he has 1 blister on the lateral aspect of his right  calf.  Imaging: No results found. No images are attached to the encounter.  Labs: Lab Results  Component Value Date   CRP 0.9 05/22/2020   REPTSTATUS 03/23/2021 FINAL 03/18/2021   CULT  03/18/2021    NO GROWTH 5 DAYS Performed at [facility], [address] [place], [place] [address]      Lab Results  Component Value Date   ALBUMIN 3.1 (L) 03/18/2021   ALBUMIN 4.0 03/18/2021   ALBUMIN 3.9 05/22/2020    Lab Results  Component Value Date   MG 1.7 03/20/2021   MG 1.9 03/19/2021   MG 1.9 03/18/2021   No results found for: VD25OH  No results found for: PREALBUMIN CBC EXTENDED Latest Ref Rng & Units 03/20/2021 03/19/2021 03/18/2021  WBC 4.0 - 10.5 K/uL 8.5 8.3 9.1  RBC 4.22 - 5.81 MIL/uL 3.74(L) 3.66(L) 3.46(L)  HGB 13.0 - 17.0 g/dL 12.8(L) 12.5(L) 12.0(L)  HCT 39.0 - 52.0 % 38.7(L) 38.0(L) 35.6(L)  PLT 150 - 400 K/uL 238 245 238  NEUTROABS 1.7 - 7.7 K/uL - - 5.0  LYMPHSABS 0.7 - 4.0 K/uL - - 2.0     There is no height or weight on file to calculate BMI.  Orders:  No orders of the defined types were placed in this encounter.  Meds ordered this encounter  Medications   sulfamethoxazole-trimethoprim (BACTRIM DS) 800-160 MG tablet  Sig: Take 1 tablet by mouth 2 (two) times daily.    Dispense:  20 tablet    Refill:  0     Procedures: No procedures performed  Clinical Data: No additional findings.  ROS:  All other systems negative, except as noted in the HPI. Review of Systems  Objective: Vital Signs: There were no vitals taken for this visit.  Specialty Comments:  No specialty comments available.  PMFS History: Patient Active Problem List   Diagnosis Date Noted   Cellulitis of right foot 03/18/2021   Severe sepsis (HCC) 03/18/2021   AKI (acute kidney injury) (HCC) 03/18/2021   HIV disease (HCC) 06/10/2020   Screening for STDs (sexually transmitted diseases) 06/10/2020   Immunization counseling 06/10/2020   Asthma in adult, mild  persistent, with acute exacerbation 05/22/2020   COVID-19 virus infection 05/22/2020   Acute respiratory failure with hypoxia (HCC) 05/22/2020   Asthma 05/22/2020   Past Medical History:  Diagnosis Date   Asthma    HIV (human immunodeficiency virus infection) (HCC)     Family History  Problem Relation Age of Onset   Cancer Mother     History reviewed. No pertinent surgical history. Social History   Occupational History   Not on file  Tobacco Use   Smoking status: Former    Packs/day: 0.25    Types: Cigarettes   Smokeless tobacco: Never  Vaping Use   Vaping Use: Never used  Substance and Sexual Activity   Alcohol use: No   Drug use: No   Sexual activity: Not on file

## 2021-03-29 ENCOUNTER — Telehealth: Payer: Self-pay

## 2021-03-29 ENCOUNTER — Other Ambulatory Visit: Payer: Self-pay

## 2021-03-29 ENCOUNTER — Telehealth: Payer: Self-pay | Admitting: Orthopedic Surgery

## 2021-03-29 ENCOUNTER — Other Ambulatory Visit (HOSPITAL_COMMUNITY): Payer: Self-pay

## 2021-03-29 MED ORDER — PREDNISONE 10 MG PO TABS
10.0000 mg | ORAL_TABLET | Freq: Every day | ORAL | 0 refills | Status: DC
  Filled 2021-03-29: qty 60, 60d supply, fill #0

## 2021-03-29 MED ORDER — PREDNISONE 10 MG PO TABS: 10.0000 mg | ORAL_TABLET | Freq: Every day | ORAL | 0 refills | Status: DC

## 2021-03-29 NOTE — Telephone Encounter (Signed)
Pt called and states pednisone was sent ot the wrong pharmacy. He wanted it sent to [facility] on [address], [place]. He wants autumn to call him when done.   CB [phone]

## 2021-03-29 NOTE — Telephone Encounter (Signed)
I called pt per Dr. Lajoyce Corners and advised that  he would like for him to start taking Prednisone 10 mg 2 po with breakfast to see if this will help clear up the area on his foot. Advised that the sock should have helped to heal if it was fungal and the antibiotic should have helped if it was bacterial and lets see what the steroid can do to help clear up the area and we will follow up with him in the office at his next visit. To call with any changes or questions pt verbalized understanding.

## 2021-03-29 NOTE — Telephone Encounter (Signed)
I called pt to advise that this has been done.  °

## 2021-04-05 ENCOUNTER — Ambulatory Visit (INDEPENDENT_AMBULATORY_CARE_PROVIDER_SITE_OTHER): Payer: BC Managed Care – PPO | Admitting: Orthopedic Surgery

## 2021-04-05 ENCOUNTER — Encounter: Payer: Self-pay | Admitting: Orthopedic Surgery

## 2021-04-05 ENCOUNTER — Other Ambulatory Visit: Payer: Self-pay

## 2021-04-05 DIAGNOSIS — L120 Bullous pemphigoid: Secondary | ICD-10-CM

## 2021-04-05 NOTE — Progress Notes (Signed)
Office Visit Note   Patient: David [name]           Date of Birth: 10/11/1965           MRN: [id number] Visit Date: 04/05/2021              Requested by: No referring provider defined for this encounter. PCP: Pcp, No  Chief Complaint  Patient presents with   Right Foot - Wound Check    Blistering reaction to hands and feet.  Patient was initially treated with antibiotics without change in the symptoms.  After initiation of a course of prednisone the blisters quickly resolved.  HPI: Patient is a 55 year old gentleman who presents in follow-up for an autoimmune  Assessment & Plan: Visit Diagnoses: No diagnosis found.  Plan: Patient will start weaning off the prednisone he is currently on 20 mg with breakfast he will now take 10 mg with breakfast for a week and then 10 mg every other day for a week and then discontinue.  Patient may return to work on 11/14.  Follow-Up Instructions: Return if symptoms worsen or fail to improve.   Ortho Exam  Patient is alert, oriented, no adenopathy, well-dressed, normal affect, normal respiratory effort. Examination of the skin is flat the blisters have healed there is no drainage no cellulitis no signs of infection.  Imaging: No results found. No images are attached to the encounter.  Labs: Lab Results  Component Value Date   CRP 0.9 05/22/2020   REPTSTATUS 03/23/2021 FINAL 03/18/2021   CULT  03/18/2021    NO GROWTH 5 DAYS Performed at [facility], [address] [place], [place] [address]      Lab Results  Component Value Date   ALBUMIN 3.1 (L) 03/18/2021   ALBUMIN 4.0 03/18/2021   ALBUMIN 3.9 05/22/2020    Lab Results  Component Value Date   MG 1.7 03/20/2021   MG 1.9 03/19/2021   MG 1.9 03/18/2021   No results found for: VD25OH  No results found for: PREALBUMIN CBC EXTENDED Latest Ref Rng & Units 03/20/2021 03/19/2021 03/18/2021  WBC 4.0 - 10.5 K/uL 8.5 8.3 9.1  RBC 4.22 - 5.81 MIL/uL 3.74(L) 3.66(L)  3.46(L)  HGB 13.0 - 17.0 g/dL 12.8(L) 12.5(L) 12.0(L)  HCT 39.0 - 52.0 % 38.7(L) 38.0(L) 35.6(L)  PLT 150 - 400 K/uL 238 245 238  NEUTROABS 1.7 - 7.7 K/uL - - 5.0  LYMPHSABS 0.7 - 4.0 K/uL - - 2.0     There is no height or weight on file to calculate BMI.  Orders:  No orders of the defined types were placed in this encounter.  No orders of the defined types were placed in this encounter.    Procedures: No procedures performed  Clinical Data: No additional findings.  ROS:  All other systems negative, except as noted in the HPI. Review of Systems  Objective: Vital Signs: There were no vitals taken for this visit.  Specialty Comments:  No specialty comments available.  PMFS History: Patient Active Problem List   Diagnosis Date Noted   Cellulitis of right foot 03/18/2021   Severe sepsis (HCC) 03/18/2021   AKI (acute kidney injury) (HCC) 03/18/2021   HIV disease (HCC) 06/10/2020   Screening for STDs (sexually transmitted diseases) 06/10/2020   Immunization counseling 06/10/2020   Asthma in adult, mild persistent, with acute exacerbation 05/22/2020   COVID-19 virus infection 05/22/2020   Acute respiratory failure with hypoxia (HCC) 05/22/2020   Asthma 05/22/2020  Past Medical History:  Diagnosis Date   Asthma    HIV (human immunodeficiency virus infection) (HCC)     Family History  Problem Relation Age of Onset   Cancer Mother     History reviewed. No pertinent surgical history. Social History   Occupational History   Not on file  Tobacco Use   Smoking status: Former    Packs/day: 0.25    Types: Cigarettes   Smokeless tobacco: Never  Vaping Use   Vaping Use: Never used  Substance and Sexual Activity   Alcohol use: No   Drug use: No   Sexual activity: Not on file

## 2021-04-06 ENCOUNTER — Other Ambulatory Visit (HOSPITAL_COMMUNITY): Payer: Self-pay

## 2021-04-11 ENCOUNTER — Other Ambulatory Visit (HOSPITAL_COMMUNITY): Payer: Self-pay

## 2021-04-11 ENCOUNTER — Telehealth: Payer: Self-pay

## 2021-04-11 NOTE — Telephone Encounter (Signed)
Pt called and states that [employer] wants medical records from 05/2019 to date can we fax these for the pt or does he need to sign another release? I advised pt that I would ask and let him know.

## 2021-04-11 NOTE — Telephone Encounter (Signed)
Pt called and would like a call back from Autumn regarding some paper work that he stated she filled out.

## 2021-04-12 NOTE — Telephone Encounter (Signed)
IC, spoke with patient,advised records faxed on 04/06/21 to [place]. Told him to let us know if they need to be refaxed.

## 2021-04-14 ENCOUNTER — Telehealth: Payer: Self-pay

## 2021-04-14 NOTE — Telephone Encounter (Signed)
Pt called and would like a call back from autumn regarding some paper work that his doctor is requesting.   Please advise

## 2021-04-15 NOTE — Telephone Encounter (Signed)
IC, spoke with patient. He needs a note stating that the condition he was treated for in the hospital was not a re-occurrence. Also, include on the note the medications that Dr. Lajoyce Corners prescribed to him. Fax to [employer] ,Attn: Trisha(we have the the fax number on forms)

## 2021-04-15 NOTE — Telephone Encounter (Signed)
Can you see if pt will need to sign release for this please?

## 2021-04-18 ENCOUNTER — Other Ambulatory Visit: Payer: Self-pay

## 2021-04-18 ENCOUNTER — Telehealth: Payer: Self-pay | Admitting: Orthopedic Surgery

## 2021-04-18 NOTE — Telephone Encounter (Signed)
This is duplicate will sign off on this and address what I have in box.  The paperwork has been completed and fax last week. The employer is requesting name of medication treated with and if this is a recurrent issue.

## 2021-04-18 NOTE — Telephone Encounter (Signed)
Pt called requesting a call back from David [name]. Pt states David was to fill out some forms and call when sent to company. Please call pt at [phone].

## 2021-04-18 NOTE — Telephone Encounter (Signed)
I called pt and advised that I have faxed the note.

## 2021-04-20 ENCOUNTER — Other Ambulatory Visit (HOSPITAL_COMMUNITY): Payer: Self-pay

## 2021-04-25 ENCOUNTER — Other Ambulatory Visit: Payer: Self-pay | Admitting: Orthopedic Surgery

## 2021-05-27 ENCOUNTER — Other Ambulatory Visit (HOSPITAL_COMMUNITY): Payer: Self-pay

## 2021-06-01 ENCOUNTER — Telehealth: Payer: Self-pay | Admitting: Orthopedic Surgery

## 2021-06-01 NOTE — Telephone Encounter (Signed)
Can you please call pt. If he thinks he has cellulitis we need to see him in the office last visit was 03/2021. Please call and make an appt with Erin or Dr. Lajoyce Corners and let me know if you need a spot opened once oyu have called pt.

## 2021-06-01 NOTE — Telephone Encounter (Signed)
done

## 2021-06-01 NOTE — Telephone Encounter (Signed)
Pt called and states that he has cellulitis and the blisters are starting to come back on his feet. Wondering if he can get some prednisone.   CB [phone]

## 2021-06-02 ENCOUNTER — Other Ambulatory Visit: Payer: Self-pay

## 2021-06-02 ENCOUNTER — Encounter: Payer: Self-pay | Admitting: Orthopedic Surgery

## 2021-06-02 ENCOUNTER — Ambulatory Visit (INDEPENDENT_AMBULATORY_CARE_PROVIDER_SITE_OTHER): Payer: BC Managed Care – PPO | Admitting: Orthopedic Surgery

## 2021-06-02 DIAGNOSIS — L309 Dermatitis, unspecified: Secondary | ICD-10-CM

## 2021-06-02 NOTE — Progress Notes (Signed)
Office Visit Note   Patient: David [name]           Date of Birth: 1965/07/23           MRN: [id number] Visit Date: 06/02/2021              Requested by: No referring provider defined for this encounter. PCP: Pcp, No  Chief Complaint  Patient presents with   Right Foot - Wound Check   Left Foot - Wound Check      HPI: Patient is a 56 year old gentleman who presents complaining of dry cracked blistering skin on both feet.  Patient states that he wore the same shoes that he did the last time this blister rash occurred.  Assessment & Plan: Visit Diagnoses:  1. Dermatitis of both feet     Plan: Patient appears to have a fungal dermatitis.  He was given a pair of Vive crew socks size extra-large to be worn daily.  Follow-Up Instructions: Return in about 4 weeks (around 06/30/2021).   Ortho Exam  Patient is alert, oriented, no adenopathy, well-dressed, normal affect, normal respiratory effort. Examination patient has good dorsalis pedis pulses.  His feet are plantigrade there are no plantar ulcers there is no cellulitis he does have a dermatitis that is circumferentially around both feet without signs of infection.  Imaging: No results found. No images are attached to the encounter.  Labs: Lab Results  Component Value Date   CRP 0.9 05/22/2020   REPTSTATUS 03/23/2021 FINAL 03/18/2021   CULT  03/18/2021    NO GROWTH 5 DAYS Performed at [facility], [address], [place], [place] [address]      Lab Results  Component Value Date   ALBUMIN 3.1 (L) 03/18/2021   ALBUMIN 4.0 03/18/2021   ALBUMIN 3.9 05/22/2020    Lab Results  Component Value Date   MG 1.7 03/20/2021   MG 1.9 03/19/2021   MG 1.9 03/18/2021   No results found for: VD25OH  No results found for: PREALBUMIN CBC EXTENDED Latest Ref Rng & Units 03/20/2021 03/19/2021 03/18/2021  WBC 4.0 - 10.5 K/uL 8.5 8.3 9.1  RBC 4.22 - 5.81 MIL/uL 3.74(L) 3.66(L) 3.46(L)  HGB 13.0 - 17.0 g/dL 12.8(L)  12.5(L) 12.0(L)  HCT 39.0 - 52.0 % 38.7(L) 38.0(L) 35.6(L)  PLT 150 - 400 K/uL 238 245 238  NEUTROABS 1.7 - 7.7 K/uL - - 5.0  LYMPHSABS 0.7 - 4.0 K/uL - - 2.0     There is no height or weight on file to calculate BMI.  Orders:  No orders of the defined types were placed in this encounter.  No orders of the defined types were placed in this encounter.    Procedures: No procedures performed  Clinical Data: No additional findings.  ROS:  All other systems negative, except as noted in the HPI. Review of Systems  Objective: Vital Signs: There were no vitals taken for this visit.  Specialty Comments:  No specialty comments available.  PMFS History: Patient Active Problem List   Diagnosis Date Noted   Cellulitis of right foot 03/18/2021   Severe sepsis (HCC) 03/18/2021   AKI (acute kidney injury) (HCC) 03/18/2021   HIV disease (HCC) 06/10/2020   Screening for STDs (sexually transmitted diseases) 06/10/2020   Immunization counseling 06/10/2020   Asthma in adult, mild persistent, with acute exacerbation 05/22/2020   COVID-19 virus infection 05/22/2020   Acute respiratory failure with hypoxia (HCC) 05/22/2020   Asthma 05/22/2020   Past Medical  History:  Diagnosis Date   Asthma    HIV (human immunodeficiency virus infection) (HCC)     Family History  Problem Relation Age of Onset   Cancer Mother     History reviewed. No pertinent surgical history. Social History   Occupational History   Not on file  Tobacco Use   Smoking status: Former    Packs/day: 0.25    Types: Cigarettes   Smokeless tobacco: Never  Vaping Use   Vaping Use: Never used  Substance and Sexual Activity   Alcohol use: No   Drug use: No   Sexual activity: Not on file

## 2021-06-06 ENCOUNTER — Ambulatory Visit: Payer: BC Managed Care – PPO | Admitting: Internal Medicine

## 2021-06-13 ENCOUNTER — Encounter: Payer: Self-pay | Admitting: Infectious Diseases

## 2021-06-13 ENCOUNTER — Ambulatory Visit (INDEPENDENT_AMBULATORY_CARE_PROVIDER_SITE_OTHER): Payer: BC Managed Care – PPO | Admitting: Infectious Diseases

## 2021-06-13 ENCOUNTER — Other Ambulatory Visit (HOSPITAL_COMMUNITY): Payer: Self-pay

## 2021-06-13 ENCOUNTER — Other Ambulatory Visit: Payer: Self-pay

## 2021-06-13 VITALS — BP 156/103 | HR 101 | Temp 98.4°F | Ht 73.0 in | Wt 217.0 lb

## 2021-06-13 DIAGNOSIS — Z8619 Personal history of other infectious and parasitic diseases: Secondary | ICD-10-CM

## 2021-06-13 DIAGNOSIS — N179 Acute kidney failure, unspecified: Secondary | ICD-10-CM

## 2021-06-13 DIAGNOSIS — B20 Human immunodeficiency virus [HIV] disease: Secondary | ICD-10-CM

## 2021-06-13 DIAGNOSIS — Z113 Encounter for screening for infections with a predominantly sexual mode of transmission: Secondary | ICD-10-CM | POA: Diagnosis not present

## 2021-06-13 DIAGNOSIS — J4530 Mild persistent asthma, uncomplicated: Secondary | ICD-10-CM

## 2021-06-13 DIAGNOSIS — Z7185 Encounter for immunization safety counseling: Secondary | ICD-10-CM

## 2021-06-13 DIAGNOSIS — A539 Syphilis, unspecified: Secondary | ICD-10-CM

## 2021-06-13 HISTORY — DX: Personal history of other infectious and parasitic diseases: Z86.19

## 2021-06-13 MED ORDER — ALBUTEROL SULFATE HFA 108 (90 BASE) MCG/ACT IN AERS
2.0000 | INHALATION_SPRAY | Freq: Four times a day (QID) | RESPIRATORY_TRACT | 2 refills | Status: DC | PRN
  Filled 2021-06-13: qty 18, 30d supply, fill #0
  Filled 2021-09-13: qty 8.5, 25d supply, fill #1
  Filled 2022-01-13: qty 18, 25d supply, fill #2

## 2021-06-13 MED ORDER — BICTEGRAVIR-EMTRICITAB-TENOFOV 50-200-25 MG PO TABS
1.0000 | ORAL_TABLET | Freq: Every day | ORAL | 5 refills | Status: DC
  Filled 2021-06-13 – 2021-06-23 (×2): qty 30, 30d supply, fill #0
  Filled 2021-07-19: qty 30, 30d supply, fill #1
  Filled 2021-08-12: qty 30, 30d supply, fill #2
  Filled 2021-09-13: qty 30, 30d supply, fill #3
  Filled 2021-10-14: qty 30, 30d supply, fill #4
  Filled 2021-11-10: qty 30, 30d supply, fill #5

## 2021-06-13 NOTE — Assessment & Plan Note (Addendum)
Refill inhaler. Recommended daily antihistamine also (longer acting zyrtec or claritin in AM with benadryl in evenings as he has been doing for itching).

## 2021-06-13 NOTE — Patient Instructions (Addendum)
Continue your medications.  Stop by the lab on your way out.   [facility] for primary care  [place], [place], [place] [address] Phone: [phone]  First step to follow up a high blood pressure would be to get more information - checking periodically at home (morning or evening) a few times a week if you can. Would recommend an automated arm blood pressure cuff. The wrist ones are not great.    Let me know if you don't get your inhaler.  Sometimes antihistamines everyday can help with reducing your

## 2021-06-13 NOTE — Assessment & Plan Note (Signed)
Continue biktarvy once daily. Vaccines up to date. Will provide refills and help him get in touch with local primary care provider to help with regular preventative care and BP follow up.   Pertinent labs ordered today.  FU in 42m with Dr. Elinor Parkinson.

## 2021-06-13 NOTE — Assessment & Plan Note (Signed)
Will follow up RPR today for treatment response.

## 2021-06-13 NOTE — Assessment & Plan Note (Signed)
Needs hepatitis B vaccines with non-reactive surface antibody 1 year ago. Will d/w him next OV.

## 2021-06-13 NOTE — Assessment & Plan Note (Signed)
Lab Results  Component Value Date   CREATININE 1.24 03/20/2021   CREATININE 0.98 03/19/2021   CREATININE 1.25 (H) 03/18/2021   Follow creatinine for any changes.

## 2021-06-13 NOTE — Progress Notes (Signed)
Established Patient Office Visit  Subjective:  Patient ID: David [name], male    DOB: 1965-08-06  Age: 56 y.o. MRN: [id number]  CC:  Chief Complaint  Patient presents with   Follow-up    Given condoms     HPI David [name] presents for routine HIV follow up care.   Doing well on biktarvy once daily without any side effects. No access problems. Not currently sexually active at present.   Treated for syphilis 1 year ago with 3 weeks of PO doxycycline with titer 1:128. Has not been back to repeat this yet. No ulcers or lymphadenopathy noted. Has had a rash / dermatitis to both feet and in care with Dr. Sharol Given and being treated for fungal dermatitis. He has pictures of what it looked like during hospitalization when he formed secondary purulent skin / soft tissue infection requiring a few days of IV antibiotics inpatient.   Has not had BP checked since he was in the hospital last time. Does not have PCP.     HIV 1 RNA Quant (copies/mL)  Date Value  08/27/2020 NOT DETECTED  07/09/2020 <20 NOT DETECTED  06/10/2020 100 (H)   CD4 T Cell Abs (/uL)  Date Value  06/10/2020 695   Lab Results  Component Value Date   CREATININE 1.24 03/20/2021   CREATININE 0.98 03/19/2021   CREATININE 1.25 (H) 03/18/2021      Past Medical History:  Diagnosis Date   Asthma    HIV (human immunodeficiency virus infection) ([place])     Social History   Socioeconomic History   Marital status: Single    Spouse name: Not on file   Number of children: Not on file   Years of education: Not on file   Highest education level: Not on file  Occupational History   Not on file  Tobacco Use   Smoking status: Every Day    Packs/day: 0.30    Types: Cigarettes   Smokeless tobacco: Never  Vaping Use   Vaping Use: Never used  Substance and Sexual Activity   Alcohol use: No   Drug use: No   Sexual activity: Not on file  Other Topics Concern   Not on file  Social History Narrative   **  Merged History Encounter **       Social Determinants of Health   Financial Resource Strain: Not on file  Food Insecurity: Not on file  Transportation Needs: Not on file  Physical Activity: Not on file  Stress: Not on file  Social Connections: Not on file  Intimate Partner Violence: Not on file    Outpatient Medications Prior to Visit  Medication Sig Dispense Refill   ascorbic acid (VITAMIN C) 500 MG tablet Take 1 tablet (500 mg total) by mouth daily. 90 tablet 0   bictegravir-emtricitabine-tenofovir AF (BIKTARVY) 50-200-25 MG TABS tablet Take 1 tablet by mouth daily. 30 tablet 3   albuterol (PROVENTIL) (2.5 MG/3ML) 0.083% nebulizer solution Take 3 mLs (2.5 mg total) by nebulization every 6 (six) hours as needed for wheezing or shortness of breath. (Patient not taking: Reported on 06/13/2021) 75 mL 1   zinc sulfate 220 (50 Zn) MG capsule Take 1 capsule (220 mg total) by mouth daily. (Patient not taking: Reported on 06/13/2021) 90 capsule 0   albuterol (VENTOLIN HFA) 108 (90 Base) MCG/ACT inhaler Inhale 2 puffs into the lungs every 6 (six) hours as needed for wheezing or shortness of breath. (Patient not taking: Reported on 06/13/2021) 6.7 g 1  clindamycin (CLEOCIN) 300 MG capsule Take 300 mg by mouth 3 (three) times daily. (Patient not taking: Reported on 06/13/2021)     predniSONE (DELTASONE) 10 MG tablet TAKE 2 TABLETS BY MOUTH DAILY WITH BREAKFAST (Patient not taking: Reported on 06/13/2021) 60 tablet 0   sulfamethoxazole-trimethoprim (BACTRIM DS) 800-160 MG tablet Take 1 tablet by mouth 2 (two) times daily. (Patient not taking: Reported on 06/13/2021) 20 tablet 0   No facility-administered medications prior to visit.    Allergies  Allergen Reactions   Penicillins Swelling    ROS Review of Systems  Constitutional:  Negative for chills and fever.  HENT:  Negative for sore throat.   Eyes:  Negative for visual disturbance.  Gastrointestinal:  Negative for abdominal pain, anal  bleeding, diarrhea and rectal pain.  Genitourinary:  Negative for dysuria, genital sores, penile discharge, penile pain, scrotal swelling and testicular pain.  Musculoskeletal:  Negative for arthralgias and joint swelling.  Skin:  Positive for rash (both feet).  Neurological:  Negative for headaches.  Hematological:  Negative for adenopathy.  Psychiatric/Behavioral:  Negative for behavioral problems and sleep disturbance.      Objective:    Physical Exam Vitals reviewed.  Constitutional:      Appearance: He is well-developed.     Comments: Seated comfortably in chair during visit.   HENT:     Mouth/Throat:     Dentition: Normal dentition. No dental abscesses.  Cardiovascular:     Rate and Rhythm: Normal rate and regular rhythm.     Heart sounds: Normal heart sounds.  Pulmonary:     Effort: Pulmonary effort is normal.     Breath sounds: Normal breath sounds.  Abdominal:     General: There is no distension.     Palpations: Abdomen is soft.     Tenderness: There is no abdominal tenderness.  Lymphadenopathy:     Cervical: No cervical adenopathy.  Skin:    General: Skin is warm and dry.     Findings: No rash.  Neurological:     Mental Status: He is alert and oriented to person, place, and time.  Psychiatric:        Judgment: Judgment normal.     BP (!) 156/103    Pulse (!) 101    Temp 98.4 F (36.9 C) (Oral)    Ht 6\' 1"  (1.854 m)    Wt 217 lb (98.4 kg)    SpO2 96%    BMI 28.63 kg/m   Wt Readings from Last 3 Encounters:  06/13/21 217 lb (98.4 kg)  03/20/21 212 lb 15.4 oz (96.6 kg)  08/27/20 214 lb (97.1 kg)     Health Maintenance Due  Topic Date Due   TETANUS/TDAP  Never done   Zoster Vaccines- Shingrix (1 of 2) Never done   COLONOSCOPY (Pts 45-41yrs Insurance coverage will need to be confirmed)  Never done   COVID-19 Vaccine (4 - Booster for Pfizer series) 06/01/2020   INFLUENZA VACCINE  12/27/2020    There are no preventive care reminders to display for this  patient.  No results found for: TSH Lab Results  Component Value Date   WBC 8.5 03/20/2021   HGB 12.8 (L) 03/20/2021   HCT 38.7 (L) 03/20/2021   MCV 103.5 (H) 03/20/2021   PLT 238 03/20/2021   Lab Results  Component Value Date   NA 133 (L) 03/20/2021   K 3.7 03/20/2021   CO2 22 03/20/2021   GLUCOSE 89 03/20/2021   BUN 13 03/20/2021  CREATININE 1.24 03/20/2021   BILITOT 0.9 03/18/2021   ALKPHOS 71 03/18/2021   AST 22 03/18/2021   ALT 22 03/18/2021   PROT 6.9 03/18/2021   ALBUMIN 3.1 (L) 03/18/2021   CALCIUM 8.2 (L) 03/20/2021   ANIONGAP 7 03/20/2021   Lab Results  Component Value Date   CHOL 172 06/10/2020   Lab Results  Component Value Date   HDL 41 06/10/2020   Lab Results  Component Value Date   LDLCALC 111 (H) 06/10/2020   Lab Results  Component Value Date   TRIG 92 06/10/2020   Lab Results  Component Value Date   CHOLHDL 4.2 06/10/2020   No results found for: HGBA1C    Assessment & Plan:   Problem List Items Addressed This Visit       Unprioritized   Asthma    Refill inhaler. Recommended daily antihistamine also (longer acting zyrtec or claritin in AM with benadryl in evenings as he has been doing for itching).       Relevant Medications   albuterol (VENTOLIN HFA) 108 (90 Base) MCG/ACT inhaler   HIV disease (HCC) - Primary    Continue biktarvy once daily. Vaccines up to date. Will provide refills and help him get in touch with local primary care provider to help with regular preventative care and BP follow up.   Pertinent labs ordered today.  FU in 54m with Dr. [place] B[place].       Relevant Medications   bictegravir-emtricitabine-tenofovir AF (BIKTARVY) 50-200-25 MG TABS tablet   Other Relevant Orders   HIV-1 RNA quant-no reflex-bld   T-helper cell (CD4)- ([facility] clinic only)   COMPLETE METABOLIC PANEL WITH GFR   Screening for STDs (sexually transmitted diseases)   Immunization counseling    Needs hepatitis B vaccines with non-reactive  surface antibody 1 year ago. Will d/w him next OV.       AKI (acute kidney injury) [facility])    Lab Results  Component Value Date   CREATININE 1.24 03/20/2021   CREATININE 0.98 03/19/2021   CREATININE 1.25 (H) 03/18/2021  Follow creatinine for any changes.       History of syphilis    Will follow up RPR today for treatment response.        Meds ordered this encounter  Medications   albuterol (VENTOLIN HFA) 108 (90 Base) MCG/ACT inhaler    Sig: Inhale 2 puffs into the lungs every 6 (six) hours as needed for wheezing or shortness of breath.    Dispense:  18 g    Refill:  2    Please mail to the patient.    Order Specific Question:   Supervising Provider    Answer:   Thayer Headings [[id number]]   bictegravir-emtricitabine-tenofovir AF (BIKTARVY) 50-200-25 MG TABS tablet    Sig: Take 1 tablet by mouth daily.    Dispense:  30 tablet    Refill:  5    Order Specific Question:   Supervising Provider    Answer:   Thayer Headings [id number]    Follow-up: Return in about 6 months (around 12/11/2021).    Janene Madeira, NP

## 2021-06-14 ENCOUNTER — Other Ambulatory Visit (HOSPITAL_COMMUNITY): Payer: Self-pay

## 2021-06-14 ENCOUNTER — Telehealth: Payer: Self-pay

## 2021-06-14 DIAGNOSIS — A539 Syphilis, unspecified: Secondary | ICD-10-CM

## 2021-06-14 DIAGNOSIS — Z8619 Personal history of other infectious and parasitic diseases: Secondary | ICD-10-CM

## 2021-06-14 LAB — T-HELPER CELL (CD4) - (RCID CLINIC ONLY)
CD4 % Helper T Cell: 26 % — ABNORMAL LOW (ref 33–65)
CD4 T Cell Abs: 887 /uL (ref 400–1790)

## 2021-06-14 MED ORDER — DOXYCYCLINE HYCLATE 100 MG PO TABS
100.0000 mg | ORAL_TABLET | Freq: Two times a day (BID) | ORAL | 0 refills | Status: DC
Start: 2021-06-14 — End: 2021-12-19

## 2021-06-14 NOTE — Telephone Encounter (Signed)
Spoke with patient, relayed that Judeth Cornfield would like to re-treat him for syphilis due to titers. Notified him that doxycycline will be sent in.   Relayed that immune system looks strong and kidney function is stable. Relayed viral load is still pending. Patient verbalized understanding and has no further questions.   Sandie Ano, RN

## 2021-06-14 NOTE — Progress Notes (Signed)
Please call David [name] to let him know that I want to re-treat him for syphilis. His titers did not drop down like I would like to see. Would recommend doxycycline 100 mg tabs, 1 tab BID for 28 days. This may help dry up some of his skin problems too. Immune system looks nice a strong, viral load is pending, kidney function testing is stable.

## 2021-06-14 NOTE — Telephone Encounter (Signed)
-----   Message from Blanchard Kelch, NP sent at 06/14/2021  4:32 PM EST ----- Please call David [name] to let him know that I want to re-treat him for syphilis. His titers did not drop down like I would like to see. Would recommend doxycycline 100 mg tabs, 1 tab BID for 28 days. This may help dry up some of his skin problems too. Immune system looks nice a strong, viral load is pending, kidney function testing is stable.

## 2021-06-15 LAB — COMPLETE METABOLIC PANEL WITHOUT GFR
AG Ratio: 1 (calc) (ref 1.0–2.5)
ALT: 16 U/L (ref 9–46)
AST: 19 U/L (ref 10–35)
Albumin: 3.8 g/dL (ref 3.6–5.1)
Alkaline phosphatase (APISO): 86 U/L (ref 35–144)
BUN/Creatinine Ratio: 9 (calc) (ref 6–22)
BUN: 12 mg/dL (ref 7–25)
CO2: 29 mmol/L (ref 20–32)
Calcium: 9.1 mg/dL (ref 8.6–10.3)
Chloride: 104 mmol/L (ref 98–110)
Creat: 1.34 mg/dL — ABNORMAL HIGH (ref 0.70–1.30)
Globulin: 3.9 g/dL — ABNORMAL HIGH (ref 1.9–3.7)
Glucose, Bld: 97 mg/dL (ref 65–99)
Potassium: 4.2 mmol/L (ref 3.5–5.3)
Sodium: 139 mmol/L (ref 135–146)
Total Bilirubin: 0.7 mg/dL (ref 0.2–1.2)
Total Protein: 7.7 g/dL (ref 6.1–8.1)
eGFR: 62 mL/min/1.73m2 (ref >=60)

## 2021-06-15 LAB — HIV-1 RNA QUANT-NO REFLEX-BLD
HIV 1 RNA Quant: NOT DETECTED Copies/mL
HIV-1 RNA Quant, Log: NOT DETECTED Log cps/mL

## 2021-06-15 LAB — SYPHILIS: RPR W/REFLEX TO RPR TITER AND TREPONEMAL ANTIBODIES, TRADITIONAL SCREENING AND DIAGNOSIS ALGORITHM: RPR Ser Ql: REACTIVE — AB

## 2021-06-15 LAB — RPR TITER: RPR Titer: 1:64 {titer} — ABNORMAL HIGH

## 2021-06-15 LAB — FLUORESCENT TREPONEMAL AB(FTA)-IGG-BLD: Fluorescent Treponemal ABS: REACTIVE — AB

## 2021-06-20 ENCOUNTER — Telehealth: Payer: Self-pay

## 2021-06-20 NOTE — Progress Notes (Signed)
Please let Mr. Lundwall know that his viral load remains undetectable. His liver function tests are all normal. His kidney function tests are just slightly elevated but the filtration is still normal - no changes aside from continuing to monitor, drinking plenty of water, limiting NSAIDS (ibuprofen, aleve, etc) and making sure his blood pressure is under good control - it was a bit high at the last visit. Let us know if

## 2021-06-20 NOTE — Telephone Encounter (Signed)
Patient aware of results and verbalized his understanding.  ? ? ?David [name] P Belkis Norbeck, CMA ? ?

## 2021-06-20 NOTE — Telephone Encounter (Signed)
-----   Message from Blanchard Kelch, NP sent at 06/20/2021  8:27 AM EST ----- Please let David [name] know that his viral load remains undetectable. His liver function tests are all normal. His kidney function tests are just slightly elevated but the filtration is still normal - no changes aside from continuing to monitor, drinking plenty of water, limiting NSAIDS (ibuprofen, aleve, etc) and making sure his blood pressure is under good control - it was a bit high at the last visit. Let us know if

## 2021-06-23 ENCOUNTER — Other Ambulatory Visit (HOSPITAL_COMMUNITY): Payer: Self-pay

## 2021-06-28 ENCOUNTER — Other Ambulatory Visit (HOSPITAL_COMMUNITY): Payer: Self-pay

## 2021-06-29 ENCOUNTER — Other Ambulatory Visit (HOSPITAL_COMMUNITY): Payer: Self-pay

## 2021-07-04 ENCOUNTER — Ambulatory Visit (INDEPENDENT_AMBULATORY_CARE_PROVIDER_SITE_OTHER): Payer: BC Managed Care – PPO | Admitting: Orthopedic Surgery

## 2021-07-04 ENCOUNTER — Other Ambulatory Visit: Payer: Self-pay

## 2021-07-04 DIAGNOSIS — L309 Dermatitis, unspecified: Secondary | ICD-10-CM | POA: Diagnosis not present

## 2021-07-05 ENCOUNTER — Other Ambulatory Visit (HOSPITAL_COMMUNITY): Payer: Self-pay

## 2021-07-10 ENCOUNTER — Encounter: Payer: Self-pay | Admitting: Orthopedic Surgery

## 2021-07-10 NOTE — Progress Notes (Signed)
Office Visit Note   Patient: David [name]           Date of Birth: [birth year]           MRN: [id number] Visit Date: 07/04/2021              Requested by: No referring provider defined for this encounter. PCP: Pcp, No  Chief Complaint  Patient presents with   Right Foot - Follow-up   Left Foot - Follow-up      HPI: Patient is a 56 year old gentleman is seen in follow-up for fungal dermatitis both feet he has been wearing the crew socks.  He states he is doing a lot better is also on prednisone.  Assessment & Plan: Visit Diagnoses:  1. Dermatitis of both feet     Plan: Continue with the wall or alpaca socks change frequently.  Follow-up if anything gets worse.  Follow-Up Instructions: Return if symptoms worsen or fail to improve.   Ortho Exam  Patient is alert, oriented, no adenopathy, well-dressed, normal affect, normal respiratory effort. Examination the dermatitis is resolving there are no open wounds no drainage no tenderness to palpation.  Imaging: No results found. No images are attached to the encounter.  Labs: Lab Results  Component Value Date   CRP 0.9 05/22/2020   REPTSTATUS 03/23/2021 FINAL 03/18/2021   CULT  03/18/2021    NO GROWTH 5 DAYS Performed at [facility], [place] [address], [place],  [address]      Lab Results  Component Value Date   ALBUMIN 3.1 (L) 03/18/2021   ALBUMIN 4.0 03/18/2021   ALBUMIN 3.9 05/22/2020    Lab Results  Component Value Date   MG 1.7 03/20/2021   MG 1.9 03/19/2021   MG 1.9 03/18/2021   No results found for: VD25OH  No results found for: PREALBUMIN CBC EXTENDED Latest Ref Rng & Units 03/20/2021 03/19/2021 03/18/2021  WBC 4.0 - 10.5 K/uL 8.5 8.3 9.1  RBC 4.22 - 5.81 MIL/uL 3.74(L) 3.66(L) 3.46(L)  HGB 13.0 - 17.0 g/dL 12.8(L) 12.5(L) 12.0(L)  HCT 39.0 - 52.0 % 38.7(L) 38.0(L) 35.6(L)  PLT 150 - 400 K/uL 238 245 238  NEUTROABS 1.7 - 7.7 K/uL - - 5.0  LYMPHSABS 0.7 - 4.0 K/uL - - 2.0      There is no height or weight on file to calculate BMI.  Orders:  No orders of the defined types were placed in this encounter.  No orders of the defined types were placed in this encounter.    Procedures: No procedures performed  Clinical Data: No additional findings.  ROS:  All other systems negative, except as noted in the HPI. Review of Systems  Objective: Vital Signs: There were no vitals taken for this visit.  Specialty Comments:  No specialty comments available.  PMFS History: Patient Active Problem List   Diagnosis Date Noted   History of syphilis 06/13/2021   AKI (acute kidney injury) ([place]) 03/18/2021   HIV disease ([place]) 06/10/2020   Screening for STDs (sexually transmitted diseases) 06/10/2020   Immunization counseling 06/10/2020   Asthma in adult, mild persistent, with acute exacerbation 05/22/2020   Asthma 05/22/2020   Past Medical History:  Diagnosis Date   Asthma    HIV (human immunodeficiency virus infection) ([place])     Family History  Problem Relation Age of Onset   Cancer Mother     History reviewed. No pertinent surgical history. Social History   Occupational History   Not on  file  Tobacco Use   Smoking status: Every Day    Packs/day: 0.30    Types: Cigarettes   Smokeless tobacco: Never  Vaping Use   Vaping Use: Never used  Substance and Sexual Activity   Alcohol use: No   Drug use: No   Sexual activity: Not on file

## 2021-07-11 ENCOUNTER — Other Ambulatory Visit: Payer: Self-pay | Admitting: Infectious Diseases

## 2021-07-11 DIAGNOSIS — A539 Syphilis, unspecified: Secondary | ICD-10-CM

## 2021-07-19 ENCOUNTER — Other Ambulatory Visit (HOSPITAL_COMMUNITY): Payer: Self-pay

## 2021-07-26 ENCOUNTER — Other Ambulatory Visit (HOSPITAL_COMMUNITY): Payer: Self-pay

## 2021-08-12 ENCOUNTER — Other Ambulatory Visit (HOSPITAL_COMMUNITY): Payer: Self-pay

## 2021-08-22 ENCOUNTER — Other Ambulatory Visit (HOSPITAL_COMMUNITY): Payer: Self-pay

## 2021-09-13 ENCOUNTER — Other Ambulatory Visit (HOSPITAL_COMMUNITY): Payer: Self-pay

## 2021-09-16 ENCOUNTER — Other Ambulatory Visit: Payer: Self-pay | Admitting: Orthopedic Surgery

## 2021-09-16 ENCOUNTER — Telehealth: Payer: Self-pay | Admitting: Orthopedic Surgery

## 2021-09-16 MED ORDER — PREDNISONE 10 MG PO TABS: 10.0000 mg | ORAL_TABLET | Freq: Every day | ORAL | 0 refills | Status: DC

## 2021-09-16 NOTE — Telephone Encounter (Signed)
I called pt to advise that this has been done and he also asked if he could drop off FMLA paperwork so that in the event that he is to miss work he is covered. Advised I would fill this out for him.  ?

## 2021-09-16 NOTE — Telephone Encounter (Signed)
Patient called. He would like to know if he could get a RX for Prednisone? His call back number is [phone]  ?

## 2021-09-16 NOTE — Telephone Encounter (Signed)
You gave this pt prednisone rx for skin condition and he would like for you to refill today if possible.  ?

## 2021-09-19 ENCOUNTER — Telehealth: Payer: Self-pay | Admitting: Orthopedic Surgery

## 2021-09-19 NOTE — Telephone Encounter (Signed)
FMLA paperwork will be coming in for this pt. Canyou please send to me. I advised that I would fill this out for him. Thanks.  ?

## 2021-09-19 NOTE — Telephone Encounter (Signed)
Pt called and wanted to make David [name] aware that he is having paperwork faxed over today.  ? ?CB  [phone]  ?

## 2021-09-20 ENCOUNTER — Other Ambulatory Visit (HOSPITAL_COMMUNITY): Payer: Self-pay

## 2021-09-22 NOTE — Telephone Encounter (Signed)
FYI, no form has been received as of yet. ?

## 2021-10-11 NOTE — Telephone Encounter (Signed)
I do not have any paperwork on this pt have you received something?  ?

## 2021-10-11 NOTE — Telephone Encounter (Signed)
IC, spoke with patient, advised him we have not received forms still, that he needs to reach out to insurance co to have them faxed, he grew upset and stated he would talk with "Autumn tomorrow, personally, face to face" and hung up on me. ?

## 2021-10-11 NOTE — Telephone Encounter (Addendum)
Patient is calling in stating Disability company is saying they have not received any FMLA paperwork and medication list at all for his claim and they wrote him up about his case and he would like to speak with someone immediately about what is going on he said he saw Autumn fax over the paperwork personally. First patient was told he just needed a medication list but as we are currently on the phone they are saying he needs all his FMLA paperwork ?

## 2021-10-13 NOTE — Telephone Encounter (Signed)
Paperwork received yesterday for [employer] and this has been completed pending signature for Dr. Sharol Given and then will fax back to company.

## 2021-10-14 ENCOUNTER — Other Ambulatory Visit (HOSPITAL_COMMUNITY): Payer: Self-pay

## 2021-10-18 ENCOUNTER — Other Ambulatory Visit (HOSPITAL_COMMUNITY): Payer: Self-pay

## 2021-10-20 ENCOUNTER — Other Ambulatory Visit: Payer: Self-pay | Admitting: Orthopedic Surgery

## 2021-10-20 NOTE — Telephone Encounter (Signed)
Last filled 09/16/21 for bilateral foot dermatitis

## 2021-11-10 ENCOUNTER — Other Ambulatory Visit (HOSPITAL_COMMUNITY): Payer: Self-pay

## 2021-11-18 ENCOUNTER — Telehealth: Payer: Self-pay

## 2021-11-18 ENCOUNTER — Telehealth: Payer: Self-pay | Admitting: Orthopedic Surgery

## 2021-11-18 ENCOUNTER — Other Ambulatory Visit (HOSPITAL_COMMUNITY): Payer: Self-pay

## 2021-11-18 NOTE — Telephone Encounter (Signed)
Can you please call pt? He had paperwork filled out 09/2021 to advise that he may have to miss work due to condition and this was dated to last until November of this year. He was to call the office if he was having a flair for eval if he was not going to work this is documented on his paperwork ( under media tab) the last office visit was in February. I can not write him out of work if he did not have eval in office.

## 2021-11-23 NOTE — Telephone Encounter (Signed)
No word from pt in regards to this. He will need to make another appt if he is having another flare up and missing work. Dr. Lajoyce Corners needs to eval.

## 2021-11-28 ENCOUNTER — Ambulatory Visit: Payer: BC Managed Care – PPO | Admitting: Infectious Diseases

## 2021-11-30 ENCOUNTER — Other Ambulatory Visit: Payer: Self-pay | Admitting: Orthopedic Surgery

## 2021-12-05 ENCOUNTER — Ambulatory Visit: Payer: BC Managed Care – PPO | Admitting: Infectious Diseases

## 2021-12-13 ENCOUNTER — Other Ambulatory Visit: Payer: Self-pay | Admitting: Infectious Diseases

## 2021-12-13 ENCOUNTER — Other Ambulatory Visit (HOSPITAL_COMMUNITY): Payer: Self-pay

## 2021-12-13 DIAGNOSIS — B20 Human immunodeficiency virus [HIV] disease: Secondary | ICD-10-CM

## 2021-12-13 MED ORDER — BIKTARVY 50-200-25 MG PO TABS
1.0000 | ORAL_TABLET | Freq: Every day | ORAL | 0 refills | Status: DC
  Filled 2021-12-13: qty 30, 30d supply, fill #0

## 2021-12-16 ENCOUNTER — Other Ambulatory Visit (HOSPITAL_COMMUNITY): Payer: Self-pay

## 2021-12-19 ENCOUNTER — Ambulatory Visit (INDEPENDENT_AMBULATORY_CARE_PROVIDER_SITE_OTHER): Payer: BC Managed Care – PPO | Admitting: Infectious Diseases

## 2021-12-19 ENCOUNTER — Other Ambulatory Visit: Payer: Self-pay

## 2021-12-19 ENCOUNTER — Other Ambulatory Visit (HOSPITAL_COMMUNITY): Payer: Self-pay

## 2021-12-19 ENCOUNTER — Encounter: Payer: Self-pay | Admitting: Infectious Diseases

## 2021-12-19 VITALS — BP 171/111 | HR 93 | Temp 97.9°F | Wt 217.0 lb

## 2021-12-19 DIAGNOSIS — B20 Human immunodeficiency virus [HIV] disease: Secondary | ICD-10-CM

## 2021-12-19 DIAGNOSIS — R7989 Other specified abnormal findings of blood chemistry: Secondary | ICD-10-CM

## 2021-12-19 DIAGNOSIS — Z8619 Personal history of other infectious and parasitic diseases: Secondary | ICD-10-CM

## 2021-12-19 DIAGNOSIS — Z113 Encounter for screening for infections with a predominantly sexual mode of transmission: Secondary | ICD-10-CM | POA: Diagnosis not present

## 2021-12-19 DIAGNOSIS — I10 Essential (primary) hypertension: Secondary | ICD-10-CM

## 2021-12-19 MED ORDER — BIKTARVY 50-200-25 MG PO TABS
1.0000 | ORAL_TABLET | Freq: Every day | ORAL | 11 refills | Status: DC
  Filled 2021-12-19: qty 30, 30d supply, fill #0
  Filled 2022-01-13: qty 30, 30d supply, fill #1
  Filled 2022-02-13: qty 30, 30d supply, fill #2
  Filled 2022-03-16: qty 30, 30d supply, fill #3
  Filled 2022-04-14: qty 30, 30d supply, fill #4
  Filled 2022-05-26: qty 30, 30d supply, fill #5
  Filled 2022-06-26: qty 30, 30d supply, fill #6
  Filled 2022-07-20: qty 30, 30d supply, fill #7
  Filled 2022-08-22: qty 30, 30d supply, fill #8
  Filled 2022-09-25: qty 30, 30d supply, fill #9
  Filled 2022-10-17: qty 30, 30d supply, fill #10
  Filled 2022-11-15: qty 30, 30d supply, fill #11

## 2021-12-19 NOTE — Assessment & Plan Note (Signed)
Will check RPR today - last 1:64 in January 23

## 2021-12-19 NOTE — Assessment & Plan Note (Signed)
Check creatinine today. 

## 2021-12-19 NOTE — Progress Notes (Signed)
Established Patient Office Visit  Subjective:  Patient ID: David [name], male    DOB: [birth year]  Age: 56 y.o. MRN: [id number]  CC:  Chief Complaint  Patient presents with   Follow-up    HPI David [name] presents for routine HIV follow up care.   Doing well on biktarvy once daily without any side effects. No access problems. Not currently sexually active at present. Doing well on medication overall.   Has a few other concerns to discuss today -  Meeting with new PCP soon Dustin Folks) and will discuss more elevated BP with him - feels that stress is playing a role and had a cigarette prior to coming in today.   Dermatitis has responded nicely to oral prednisone 10 mg daily but he does not like the side effects form this. Has one spot on right knee that has flared up again d/t heat.   Worried about loose stools - can have a pasty consistency. Occasionally once a month or so. Does not drink much water and wonders if that has something to do with it.   Wants to chat a little about weight training/resistance exercise and stress reduction as well.     Past Medical History:  Diagnosis Date   Asthma    HIV (human immunodeficiency virus infection) ([place])     Social History   Socioeconomic History   Marital status: Single    Spouse name: Not on file   Number of children: Not on file   Years of education: Not on file   Highest education level: Not on file  Occupational History   Not on file  Tobacco Use   Smoking status: Every Day    Packs/day: 0.30    Types: Cigarettes   Smokeless tobacco: Never  Vaping Use   Vaping Use: Never used  Substance and Sexual Activity   Alcohol use: No   Drug use: No   Sexual activity: Not on file  Other Topics Concern   Not on file  Social History Narrative   ** Merged History Encounter **       Social Determinants of Health   Financial Resource Strain: Not on file  Food Insecurity: Not on file  Transportation Needs: Not  on file  Physical Activity: Not on file  Stress: Not on file  Social Connections: Not on file  Intimate Partner Violence: Not on file    Outpatient Medications Prior to Visit  Medication Sig Dispense Refill   albuterol (VENTOLIN HFA) 108 (90 Base) MCG/ACT inhaler Inhale 2 puffs into the lungs every 6 (six) hours as needed for wheezing or shortness of breath. 18 g 2   ascorbic acid (VITAMIN C) 500 MG tablet Take 1 tablet (500 mg total) by mouth daily. 90 tablet 0   predniSONE (DELTASONE) 10 MG tablet TAKE 1 TABLET (10 MG TOTAL) BY MOUTH DAILY WITH BREAKFAST. 30 tablet 0   zinc sulfate 220 (50 Zn) MG capsule Take 1 capsule (220 mg total) by mouth daily. 90 capsule 0   bictegravir-emtricitabine-tenofovir AF (BIKTARVY) 50-200-25 MG TABS tablet Take 1 tablet by mouth daily. 30 tablet 0   doxycycline (VIBRA-TABS) 100 MG tablet Take 1 tablet (100 mg total) by mouth 2 (two) times daily. 56 tablet 0   albuterol (PROVENTIL) (2.5 MG/3ML) 0.083% nebulizer solution Take 3 mLs (2.5 mg total) by nebulization every 6 (six) hours as needed for wheezing or shortness of breath. (Patient not taking: Reported on 06/13/2021) 75 mL 1   No  facility-administered medications prior to visit.    Allergies  Allergen Reactions   Penicillins Swelling    ROS Review of Systems  Constitutional:  Negative for appetite change, chills, fatigue, fever and unexpected weight change.  Eyes:  Negative for visual disturbance.  Respiratory:  Negative for cough and shortness of breath.   Cardiovascular:  Negative for chest pain and leg swelling.  Gastrointestinal:  Negative for abdominal pain, diarrhea and nausea.  Genitourinary:  Negative for dysuria, genital sores and penile discharge.  Musculoskeletal:  Negative for joint swelling.  Skin:  Negative for color change and rash.  Neurological:  Negative for dizziness and headaches.  Hematological:  Negative for adenopathy.  Psychiatric/Behavioral:  Negative for sleep  disturbance. The patient is not nervous/anxious.       Objective:    Vitals:   12/19/21 1411  BP: (!) 171/111  Pulse: 93  Temp: 97.9 F (36.6 C)   Body mass index is 28.63 kg/m.   Physical Exam Constitutional:      Appearance: Normal appearance. He is not ill-appearing.  HENT:     Head: Normocephalic.     Mouth/Throat:     Mouth: Mucous membranes are moist.     Pharynx: Oropharynx is clear.  Eyes:     General: No scleral icterus. Cardiovascular:     Rate and Rhythm: Normal rate.  Pulmonary:     Effort: Pulmonary effort is normal.  Musculoskeletal:        General: Normal range of motion.     Cervical back: Normal range of motion.  Skin:    Coloration: Skin is not jaundiced or pale.     Comments: Scared and healed hyperpigmented areas over knees. Left is healed over. Right has a few open areas that are not draining anything. One large open area at anterior patella. No warmth, redness.   Neurological:     Mental Status: He is alert and oriented to person, place, and time.  Psychiatric:        Mood and Affect: Mood normal.        Judgment: Judgment normal.      BP (!) 171/111   Pulse 93   Temp 97.9 F (36.6 C) (Temporal)   Wt 217 lb (98.4 kg)   BMI 28.63 kg/m   Wt Readings from Last 3 Encounters:  12/19/21 217 lb (98.4 kg)  06/13/21 217 lb (98.4 kg)  03/20/21 212 lb 15.4 oz (96.6 kg)     Health Maintenance Due  Topic Date Due   TETANUS/TDAP  Never done   Zoster Vaccines- Shingrix (1 of 2) Never done   COLONOSCOPY (Pts 45-63yr Insurance coverage will need to be confirmed)  Never done   COVID-19 Vaccine (4 - Booster for [place] [employer]) 06/01/2020    There are no preventive care reminders to display for this patient.  No results found for: "TSH" Lab Results  Component Value Date   WBC 8.5 03/20/2021   HGB 12.8 (L) 03/20/2021   HCT 38.7 (L) 03/20/2021   MCV 103.5 (H) 03/20/2021   PLT 238 03/20/2021   Lab Results  Component Value Date   NA 139  06/13/2021   K 4.2 06/13/2021   CO2 29 06/13/2021   GLUCOSE 97 06/13/2021   BUN 12 06/13/2021   CREATININE 1.34 (H) 06/13/2021   BILITOT 0.7 06/13/2021   ALKPHOS 71 03/18/2021   AST 19 06/13/2021   ALT 16 06/13/2021   PROT 7.7 06/13/2021   ALBUMIN 3.1 (L) 03/18/2021   CALCIUM  9.1 06/13/2021   ANIONGAP 7 03/20/2021   EGFR 62 06/13/2021   Lab Results  Component Value Date   CHOL 172 06/10/2020   Lab Results  Component Value Date   HDL 41 06/10/2020   Lab Results  Component Value Date   LDLCALC 111 (H) 06/10/2020   Lab Results  Component Value Date   TRIG 92 06/10/2020   Lab Results  Component Value Date   CHOLHDL 4.2 06/10/2020   No results found for: "HGBA1C"    Assessment & Plan:   Problem List Items Addressed This Visit       Unprioritized   Hypertension    BP Readings from Last 3 Encounters:  12/19/21 (!) 171/111  06/13/21 (!) 156/103  03/20/21 136/81  Has had some persistent elevations. Recommended smoking cessation completely and home monitoring. Though with diastolic persistently > 026 and now SBP in 170s he should start treatment for BP - he will discuss further with his PCP team.       HIV disease ([place]) - Primary    Very well controlled on once daily Biktarvy. No concerns with access or adherence to medication. They are tolerating the medication well without side effects. No drug interactions identified.  No changes in insurance  No dental needs today.  No concern over anxious/depressed mood.  Sexual health discussed including screening needs - no needs today  Needs vaccine update at next OV - Hep B vaccine boosters needed. Influenza around October.   Return in about 6 months (around 06/21/2022).        Relevant Medications   bictegravir-emtricitabine-tenofovir AF (BIKTARVY) 50-200-25 MG TABS tablet   Other Relevant Orders   Basic metabolic panel   HIV 1 RNA quant-no reflex-bld   T-helper cells (CD4) count   History of syphilis    Will  check RPR today - last 1:64 in January 23       Relevant Orders   RPR   Elevated serum creatinine    Check creatinine today        Follow-up: Return in about 6 months (around 06/21/2022).    Janene Madeira, NP

## 2021-12-19 NOTE — Assessment & Plan Note (Signed)
BP Readings from Last 3 Encounters:  12/19/21 (!) 171/111  06/13/21 (!) 156/103  03/20/21 136/81   Has had some persistent elevations. Recommended smoking cessation completely and home monitoring. Though with diastolic persistently > 100 and now SBP in 170s he should start treatment for BP - he will discuss further with his PCP team.

## 2021-12-19 NOTE — Assessment & Plan Note (Addendum)
Very well controlled on once daily Biktarvy. No concerns with access or adherence to medication. They are tolerating the medication well without side effects. No drug interactions identified.  No changes in insurance  No dental needs today.  No concern over anxious/depressed mood.  Sexual health discussed including screening needs - no needs today  Needs vaccine update at next OV - Hep B vaccine boosters needed. Influenza around October.   Return in about 6 months (around 06/21/2022).

## 2021-12-19 NOTE — Patient Instructions (Addendum)
Please continue your Biktarvy once a day   Stop by the lab on your way out  Ask Dr. Lajoyce Corners about any topical options for the dermatitis since it is localized to your right knee now.   Mind Pump - awesome podcast that I have found helpful to build up some fitness goals.   Check out some of their home programming routines too - [employer]  Use the code for 50% off until the end of the month - [id number]  Please talk with Dr. Katrinka Blazing about your blood pressure.   Will see you back in 6 months   Recommend the flu vaccine in the fall around October)

## 2021-12-20 LAB — T-HELPER CELLS (CD4) COUNT (NOT AT ARMC)
CD4 % Helper T Cell: 33 % (ref 33–65)
CD4 T Cell Abs: 520 /uL (ref 400–1790)

## 2021-12-21 ENCOUNTER — Other Ambulatory Visit (HOSPITAL_COMMUNITY): Payer: Self-pay

## 2021-12-22 ENCOUNTER — Telehealth: Payer: Self-pay

## 2021-12-22 LAB — BASIC METABOLIC PANEL
BUN: 13 mg/dL (ref 7–25)
CO2: 26 mmol/L (ref 20–32)
Calcium: 9.2 mg/dL (ref 8.6–10.3)
Chloride: 105 mmol/L (ref 98–110)
Creat: 1.16 mg/dL (ref 0.70–1.30)
Glucose, Bld: 105 mg/dL — ABNORMAL HIGH (ref 65–99)
Potassium: 4.9 mmol/L (ref 3.5–5.3)
Sodium: 139 mmol/L (ref 135–146)

## 2021-12-22 LAB — RPR TITER: RPR Titer: 1:32 {titer} — ABNORMAL HIGH

## 2021-12-22 LAB — HIV-1 RNA QUANT-NO REFLEX-BLD
HIV 1 RNA Quant: NOT DETECTED Copies/mL
HIV-1 RNA Quant, Log: NOT DETECTED Log cps/mL

## 2021-12-22 LAB — RPR: RPR Ser Ql: REACTIVE — AB

## 2021-12-22 LAB — FLUORESCENT TREPONEMAL AB(FTA)-IGG-BLD: Fluorescent Treponemal ABS: REACTIVE — AB

## 2021-12-22 NOTE — Telephone Encounter (Signed)
Spoke with David [name], relayed that his syphilis titers have decreased appropriately indicating he has been treated. Relayed that viral load was undetectable and CD4 count healthy at over 500. He is very excited to hear this news and has no further questions.   Sandie Ano, RN

## 2021-12-22 NOTE — Telephone Encounter (Signed)
-----   Message from Blanchard Kelch, NP sent at 12/22/2021  3:21 PM EDT ----- Please call Mr. Nicklaus to let him know that his labs look great -  His syphilis titers are down indicating he was well treated.  Viral load is undetectable Immune system levels look great at 520

## 2022-01-06 ENCOUNTER — Other Ambulatory Visit: Payer: Self-pay | Admitting: Family

## 2022-01-13 ENCOUNTER — Other Ambulatory Visit (HOSPITAL_COMMUNITY): Payer: Self-pay

## 2022-01-24 ENCOUNTER — Other Ambulatory Visit (HOSPITAL_COMMUNITY): Payer: Self-pay

## 2022-02-13 ENCOUNTER — Telehealth: Payer: Self-pay | Admitting: Radiology

## 2022-02-13 ENCOUNTER — Other Ambulatory Visit: Payer: Self-pay | Admitting: Orthopedic Surgery

## 2022-02-13 ENCOUNTER — Other Ambulatory Visit (HOSPITAL_COMMUNITY): Payer: Self-pay

## 2022-02-13 MED ORDER — PREDNISONE 10 MG PO TABS: 10.0000 mg | ORAL_TABLET | Freq: Every day | ORAL | 0 refills | Status: DC

## 2022-02-13 MED ORDER — PREDNISONE 10 MG PO TABS
10.0000 mg | ORAL_TABLET | Freq: Every day | ORAL | 0 refills | Status: DC
  Filled 2022-02-13: qty 30, 30d supply, fill #0

## 2022-02-13 NOTE — Telephone Encounter (Signed)
On prednisone for dermititis of feet, last visit says been doing well on prednisone. Last here 06/2021, please advise if okay for refill.

## 2022-02-13 NOTE — Telephone Encounter (Signed)
Patient left voicemail on triage line stating that his foot is swelling and the "pus bumps" have come back. He is requesting prescription for prednisone which seemed to help the swelling and keep the bumps down.  Please advise. [phone]

## 2022-02-13 NOTE — Telephone Encounter (Signed)
Pt informed. He says it needs to go to [facility].

## 2022-02-21 ENCOUNTER — Other Ambulatory Visit (HOSPITAL_COMMUNITY): Payer: Self-pay

## 2022-03-12 ENCOUNTER — Other Ambulatory Visit: Payer: Self-pay | Admitting: Orthopedic Surgery

## 2022-03-16 ENCOUNTER — Other Ambulatory Visit (HOSPITAL_COMMUNITY): Payer: Self-pay

## 2022-03-22 ENCOUNTER — Other Ambulatory Visit (HOSPITAL_COMMUNITY): Payer: Self-pay

## 2022-04-10 ENCOUNTER — Other Ambulatory Visit: Payer: Self-pay | Admitting: Orthopedic Surgery

## 2022-04-14 ENCOUNTER — Other Ambulatory Visit (HOSPITAL_COMMUNITY): Payer: Self-pay

## 2022-04-19 ENCOUNTER — Other Ambulatory Visit (HOSPITAL_COMMUNITY): Payer: Self-pay

## 2022-04-24 ENCOUNTER — Telehealth: Payer: Self-pay | Admitting: Orthopedic Surgery

## 2022-04-24 NOTE — Telephone Encounter (Signed)
Pt has not been in the office since 06/2021 would need an appt. Please call pt to sch first available with either Erin or Dr. Lajoyce Corners thanks

## 2022-04-24 NOTE — Telephone Encounter (Signed)
Pt called in stating that he would like to talk with someone about sores he have on his legs and feet... Pt requesting callback.Marland KitchenMarland Kitchen

## 2022-05-04 ENCOUNTER — Other Ambulatory Visit (INDEPENDENT_AMBULATORY_CARE_PROVIDER_SITE_OTHER): Payer: Self-pay | Admitting: Orthopedic Surgery

## 2022-05-04 ENCOUNTER — Ambulatory Visit (INDEPENDENT_AMBULATORY_CARE_PROVIDER_SITE_OTHER): Payer: BC Managed Care – PPO | Admitting: Orthopedic Surgery

## 2022-05-04 DIAGNOSIS — L309 Dermatitis, unspecified: Secondary | ICD-10-CM | POA: Diagnosis not present

## 2022-05-04 DIAGNOSIS — L280 Lichen simplex chronicus: Secondary | ICD-10-CM | POA: Diagnosis not present

## 2022-05-04 MED ORDER — PREDNISONE 10 MG PO TABS: 10.0000 mg | ORAL_TABLET | Freq: Every day | ORAL | 0 refills | Status: DC

## 2022-05-05 LAB — ANA: Anti Nuclear Antibody (ANA): NEGATIVE

## 2022-05-05 LAB — C-REACTIVE PROTEIN: CRP: 3.3 mg/L (ref <8.0)

## 2022-05-05 LAB — RHEUMATOID FACTOR: Rheumatoid fact SerPl-aCnc: <14 IU/mL (ref <14)

## 2022-05-05 LAB — URIC ACID: Uric Acid, Serum: 7 mg/dL (ref 4.0–8.0)

## 2022-05-05 LAB — SEDIMENTATION RATE: Sed Rate: 113 mm/h — ABNORMAL HIGH (ref 0–20)

## 2022-05-07 ENCOUNTER — Encounter: Payer: Self-pay | Admitting: Orthopedic Surgery

## 2022-05-07 NOTE — Progress Notes (Signed)
Office Visit Note   Patient: David [name]           Date of Birth: 1965-11-19           MRN: [id number] Visit Date: 05/04/2022              Requested by: No referring provider defined for this encounter. PCP: Pcp, No  Chief Complaint  Patient presents with   Right Leg - Wound Check   Left Leg - Wound Check      HPI: Patient is a 56 year old gentleman who is seen in follow-up for both lower extremities.  Patient initially had some dermatitis on his feet that appeared more fungal.  Patient presents at this time with dermatitis psoriatic type rash involving both knees.  Patient states symptomatically he was much better on prednisone.  Assessment & Plan: Visit Diagnoses:  1. Dermatitis of both feet     Plan: A refill prescription was placed for prednisone.  A punch biopsy was obtained of the psoriatic rash on his knee.  A rheumatologic screen was obtained.  Of note his sed rate is 113.  Follow-Up Instructions: Return in about 1 week (around 05/11/2022).   Ortho Exam  Patient is alert, oriented, no adenopathy, well-dressed, normal affect, normal respiratory effort. Examination the rash on both feet is flat with no signs of infection no drainage.  The psoriatic type rash on both knees is new extensive over both knees.  After informed consent a punch biopsy was obtained of the psoriatic type rash.  Rheumatologic screen was obtained.  Patient has a good dorsalis pedis pulse bilaterally.  Imaging: No results found. No images are attached to the encounter.  Labs: Lab Results  Component Value Date   ESRSEDRATE 113 (H) 05/04/2022   CRP 3.3 05/04/2022   CRP 0.9 05/22/2020   LABURIC 7.0 05/04/2022   REPTSTATUS 03/23/2021 FINAL 03/18/2021   CULT  03/18/2021    NO GROWTH 5 DAYS Performed at [facility], [place] [address], [place], [place] [address]      Lab Results  Component Value Date   ALBUMIN 3.1 (L) 03/18/2021   ALBUMIN 4.0 03/18/2021   ALBUMIN 3.9  05/22/2020    Lab Results  Component Value Date   MG 1.7 03/20/2021   MG 1.9 03/19/2021   MG 1.9 03/18/2021   No results found for: "VD25OH"  No results found for: "PREALBUMIN"    Latest Ref Rng & Units 03/20/2021    4:20 AM 03/19/2021    3:35 AM 03/18/2021    7:51 AM  CBC EXTENDED  WBC 4.0 - 10.5 K/uL 8.5  8.3  9.1   RBC 4.22 - 5.81 MIL/uL 3.74  3.66  3.46   Hemoglobin 13.0 - 17.0 g/dL 12.8  12.5  12.0   HCT 39.0 - 52.0 % 38.7  38.0  35.6   Platelets 150 - 400 K/uL 238  245  238   NEUT# 1.7 - 7.7 K/uL   5.0   Lymph# 0.7 - 4.0 K/uL   2.0      There is no height or weight on file to calculate BMI.  Orders:  Orders Placed This Encounter  Procedures   Uric acid   Rheumatoid Factor   C-reactive protein   Sed Rate (ESR)   Antinuclear Antib (ANA)   Meds ordered this encounter  Medications   predniSONE (DELTASONE) 10 MG tablet    Sig: Take 1 tablet (10 mg total) by mouth daily with breakfast.  Dispense:  30 tablet    Refill:  0     Procedures: No procedures performed  Clinical Data: No additional findings.  ROS:  All other systems negative, except as noted in the HPI. Review of Systems  Objective: Vital Signs: There were no vitals taken for this visit.  Specialty Comments:  No specialty comments available.  PMFS History: Patient Active Problem List   Diagnosis Date Noted   Hypertension 12/19/2021   History of syphilis 06/13/2021   Elevated serum creatinine 03/18/2021   HIV disease ([place]) 06/10/2020   Screening for STDs (sexually transmitted diseases) 06/10/2020   Immunization counseling 06/10/2020   Asthma in adult, mild persistent, with acute exacerbation 05/22/2020   Asthma 05/22/2020   Past Medical History:  Diagnosis Date   Asthma    HIV (human immunodeficiency virus infection) ([place])     Family History  Problem Relation Age of Onset   Cancer Mother     History reviewed. No pertinent surgical history. Social History   Occupational  History   Not on file  Tobacco Use   Smoking status: Every Day    Packs/day: 0.30    Types: Cigarettes   Smokeless tobacco: Never  Vaping Use   Vaping Use: Never used  Substance and Sexual Activity   Alcohol use: No   Drug use: No   Sexual activity: Not on file

## 2022-05-16 ENCOUNTER — Telehealth: Payer: Self-pay | Admitting: Orthopedic Surgery

## 2022-05-16 NOTE — Telephone Encounter (Signed)
Pt called requesting a call back. Pt states Autumn F filled out some forms and section 7 was missed. Please call pt at [phone].

## 2022-05-16 NOTE — Telephone Encounter (Signed)
I called pt and as he was speaking to HR I advised that #7 was not completed on the forms as the pt does not have scheduled treatments. He may have a flare up and that was answered in question #8 about how much time he may need for this. The HR person asked if I would document to "see question *" in the space for question #7 and this was done and faxed back as requested. The pt will call with any other questions.

## 2022-05-24 ENCOUNTER — Other Ambulatory Visit (HOSPITAL_COMMUNITY): Payer: Self-pay

## 2022-05-26 ENCOUNTER — Other Ambulatory Visit (HOSPITAL_COMMUNITY): Payer: Self-pay

## 2022-05-30 ENCOUNTER — Other Ambulatory Visit: Payer: Self-pay

## 2022-05-30 ENCOUNTER — Other Ambulatory Visit (HOSPITAL_COMMUNITY): Payer: Self-pay

## 2022-06-05 ENCOUNTER — Encounter: Payer: Self-pay | Admitting: Orthopedic Surgery

## 2022-06-05 ENCOUNTER — Ambulatory Visit (INDEPENDENT_AMBULATORY_CARE_PROVIDER_SITE_OTHER): Payer: BC Managed Care – PPO | Admitting: Orthopedic Surgery

## 2022-06-05 DIAGNOSIS — L280 Lichen simplex chronicus: Secondary | ICD-10-CM | POA: Diagnosis not present

## 2022-06-05 NOTE — Progress Notes (Signed)
Office Visit Note   Patient: Quan Cybulski           Date of Birth: September 15, 1965           MRN: [id number] Visit Date: 06/05/2022              Requested by: No referring provider defined for this encounter. PCP: Pcp, No  Chief Complaint  Patient presents with   Right Foot - Follow-up   Left Foot - Follow-up      HPI: Patient presents in follow-up for chronic open ulcers on his knees and feet.  Patient states that the ulcers have healed up the states he has dry cracked skin which is an improvement.  Patient's punch biopsy that was obtained in the office is positive for lichen simplex chronicus.  Assessment & Plan: Visit Diagnoses:  1. Lichen simplex chronicus     Plan: Patient does have a follow-up with infectious disease and several weeks.  He will discuss with infectious disease treatment for the lichen simplex chronicus.  Follow-Up Instructions: No follow-ups on file.   Ortho Exam  Patient is alert, oriented, no adenopathy, well-dressed, normal affect, normal respiratory effort. Examination of his knees and feet there is dry increased pigmented skin with patchy flat dermatitis no open wounds no cellulitis no signs of infection.  There is no venous or lymphatic swelling.  Most recent sed rate was 133 4 weeks ago with a C-reactive protein of 3.3 and  uric acid of 7.0.  Imaging: No results found. No images are attached to the encounter.  Labs: Lab Results  Component Value Date   ESRSEDRATE 113 (H) 05/04/2022   CRP 3.3 05/04/2022   CRP 0.9 05/22/2020   LABURIC 7.0 05/04/2022   REPTSTATUS 03/23/2021 FINAL 03/18/2021   CULT  03/18/2021    NO GROWTH 5 DAYS Performed at [facility], [address] [place], [place] [address]      Lab Results  Component Value Date   ALBUMIN 3.1 (L) 03/18/2021   ALBUMIN 4.0 03/18/2021   ALBUMIN 3.9 05/22/2020    Lab Results  Component Value Date   MG 1.7 03/20/2021   MG 1.9 03/19/2021   MG 1.9 03/18/2021   No  results found for: "VD25OH"  No results found for: "PREALBUMIN"    Latest Ref Rng & Units 03/20/2021    4:20 AM 03/19/2021    3:35 AM 03/18/2021    7:51 AM  CBC EXTENDED  WBC 4.0 - 10.5 K/uL 8.5  8.3  9.1   RBC 4.22 - 5.81 MIL/uL 3.74  3.66  3.46   Hemoglobin 13.0 - 17.0 g/dL 30.1  60.1  09.3   HCT 39.0 - 52.0 % 38.7  38.0  35.6   Platelets 150 - 400 K/uL 238  245  238   NEUT# 1.7 - 7.7 K/uL   5.0   Lymph# 0.7 - 4.0 K/uL   2.0      There is no height or weight on file to calculate BMI.  Orders:  No orders of the defined types were placed in this encounter.  No orders of the defined types were placed in this encounter.    Procedures: No procedures performed  Clinical Data: No additional findings.  ROS:  All other systems negative, except as noted in the HPI. Review of Systems  Objective: Vital Signs: There were no vitals taken for this visit.  Specialty Comments:  No specialty comments available.  PMFS History: Patient Active Problem List  Diagnosis Date Noted   Hypertension 12/19/2021   History of syphilis 06/13/2021   Elevated serum creatinine 03/18/2021   HIV disease ([place]) 06/10/2020   Screening for STDs (sexually transmitted diseases) 06/10/2020   Immunization counseling 06/10/2020   Asthma in adult, mild persistent, with acute exacerbation 05/22/2020   Asthma 05/22/2020   Past Medical History:  Diagnosis Date   Asthma    HIV (human immunodeficiency virus infection) ([place])     Family History  Problem Relation Age of Onset   Cancer Mother     No past surgical history on file. Social History   Occupational History   Not on file  Tobacco Use   Smoking status: Every Day    Packs/day: 0.30    Types: Cigarettes   Smokeless tobacco: Never  Vaping Use   Vaping Use: Never used  Substance and Sexual Activity   Alcohol use: No   Drug use: No   Sexual activity: Not on file

## 2022-06-10 ENCOUNTER — Other Ambulatory Visit: Payer: Self-pay

## 2022-06-10 ENCOUNTER — Emergency Department: Payer: BC Managed Care – PPO

## 2022-06-10 ENCOUNTER — Emergency Department
Admission: EM | Admit: 2022-06-10 | Discharge: 2022-06-10 | Disposition: A | Discharge status: Home / Self Care | Payer: BC Managed Care – PPO | Attending: Emergency Medicine | Admitting: Emergency Medicine

## 2022-06-10 DIAGNOSIS — U071 COVID-19: Secondary | ICD-10-CM | POA: Diagnosis not present

## 2022-06-10 DIAGNOSIS — J45901 Unspecified asthma with (acute) exacerbation: Secondary | ICD-10-CM | POA: Insufficient documentation

## 2022-06-10 DIAGNOSIS — R0602 Shortness of breath: Secondary | ICD-10-CM | POA: Diagnosis not present

## 2022-06-10 DIAGNOSIS — Z8616 Personal history of COVID-19: Secondary | ICD-10-CM | POA: Insufficient documentation

## 2022-06-10 LAB — BASIC METABOLIC PANEL
Anion gap: 8 (ref 5–15)
BUN: 11 mg/dL (ref 6–20)
CO2: 24 mmol/L (ref 22–32)
Calcium: 8.7 mg/dL — ABNORMAL LOW (ref 8.9–10.3)
Chloride: 99 mmol/L (ref 98–111)
Creatinine, Ser: 1.46 mg/dL — ABNORMAL HIGH (ref 0.61–1.24)
GFR, Estimated: 56 mL/min — ABNORMAL LOW (ref >60)
Glucose, Bld: 140 mg/dL — ABNORMAL HIGH (ref 70–99)
Potassium: 4.5 mmol/L (ref 3.5–5.1)
Sodium: 131 mmol/L — ABNORMAL LOW (ref 135–145)

## 2022-06-10 LAB — RESP PANEL BY RT-PCR (RSV, FLU A&B, COVID)  RVPGX2
Influenza A by PCR: NEGATIVE
Influenza B by PCR: NEGATIVE
Resp Syncytial Virus by PCR: NEGATIVE
SARS Coronavirus 2 by RT PCR: POSITIVE — AB

## 2022-06-10 LAB — CBC
HCT: 36.5 % — ABNORMAL LOW (ref 39.0–52.0)
Hemoglobin: 12.1 g/dL — ABNORMAL LOW (ref 13.0–17.0)
MCH: 34.4 pg — ABNORMAL HIGH (ref 26.0–34.0)
MCHC: 33.2 g/dL (ref 30.0–36.0)
MCV: 103.7 fL — ABNORMAL HIGH (ref 80.0–100.0)
Platelets: 174 10*3/uL (ref 150–400)
RBC: 3.52 MIL/uL — ABNORMAL LOW (ref 4.22–5.81)
RDW: 13 % (ref 11.5–15.5)
WBC: 7.6 10*3/uL (ref 4.0–10.5)
nRBC: 0 % (ref 0.0–0.2)

## 2022-06-10 LAB — TROPONIN I (HIGH SENSITIVITY)
Troponin I (High Sensitivity): 11 ng/L (ref <18)
Troponin I (High Sensitivity): 8 ng/L (ref <18)

## 2022-06-10 MED ORDER — ALBUTEROL SULFATE (2.5 MG/3ML) 0.083% IN NEBU: 2.5000 mg | INHALATION_SOLUTION | Freq: Four times a day (QID) | RESPIRATORY_TRACT | 12 refills | Status: DC | PRN

## 2022-06-10 MED ORDER — MOLNUPIRAVIR EUA 200MG CAPSULE: 4.0000 | ORAL_CAPSULE | Freq: Two times a day (BID) | ORAL | 0 refills | Status: AC

## 2022-06-10 MED ORDER — IPRATROPIUM-ALBUTEROL 0.5-2.5 (3) MG/3ML IN SOLN
RESPIRATORY_TRACT | Status: AC
  Filled 2022-06-10: qty 3

## 2022-06-10 MED ORDER — ALBUTEROL SULFATE HFA 108 (90 BASE) MCG/ACT IN AERS: 2.0000 | INHALATION_SPRAY | RESPIRATORY_TRACT | Status: DC | PRN

## 2022-06-10 MED ORDER — IPRATROPIUM-ALBUTEROL 0.5-2.5 (3) MG/3ML IN SOLN: 3.0000 mL | Freq: Once | RESPIRATORY_TRACT | Status: DC

## 2022-06-10 MED ORDER — IPRATROPIUM-ALBUTEROL 0.5-2.5 (3) MG/3ML IN SOLN
3.0000 mL | Freq: Once | RESPIRATORY_TRACT | Status: AC
  Administered 2022-06-10: 3 mL via RESPIRATORY_TRACT
  Filled 2022-06-10: qty 3

## 2022-06-10 MED ORDER — PREDNISONE 20 MG PO TABS
60.0000 mg | ORAL_TABLET | Freq: Once | ORAL | Status: AC
  Administered 2022-06-10: 60 mg via ORAL
  Filled 2022-06-10: qty 3

## 2022-06-10 MED ORDER — IPRATROPIUM-ALBUTEROL 0.5-2.5 (3) MG/3ML IN SOLN
3.0000 mL | Freq: Once | RESPIRATORY_TRACT | Status: AC
  Administered 2022-06-10: 3 mL via RESPIRATORY_TRACT

## 2022-06-10 MED ORDER — METHYLPREDNISOLONE SODIUM SUCC 125 MG IJ SOLR
125.0000 mg | Freq: Once | INTRAMUSCULAR | Status: DC
  Filled 2022-06-10: qty 2

## 2022-06-10 MED ORDER — PREDNISONE 20 MG PO TABS: 40.0000 mg | ORAL_TABLET | Freq: Every day | ORAL | 0 refills | Status: AC

## 2022-06-10 MED ORDER — SODIUM CHLORIDE 0.9 % IV BOLUS: 500.0000 mL | Freq: Once | INTRAVENOUS | Status: DC

## 2022-06-10 NOTE — ED Notes (Signed)
Attempted IV stick x 2  

## 2022-06-10 NOTE — ED Provider Notes (Addendum)
[facility] Provider Note    Event Date/Time   First MD Initiated Contact with Patient 06/10/22 1836     (approximate)   History   asthma exascerbation and Shortness of Breath   HPI  David [name] is a 57 y.o. male with history of asthma who comes in with concern for asthma exacerbation.  Patient reports feeling short of breath last night.  Patient reports having an asthma flareup.  He reports getting a DuoNeb and having significant relief in symptoms from triage.  He does report being around somebody who was sick but not sure if they had COVID or not.  He denies any risk factors for PE other than he did do a long car trip for Christmas but he denies any swelling his legs, calf tenderness and states this feels exactly like his prior asthma exacerbations.  He does not have a history of HIV but he is currently on Biktarvy and was last seen by ID on 11/2021 and he has been nondetectable of HIV.  He denies any chest pain, pain with breathing coughing up blood or any other concerns.  He reports that he ran out of his nebulized albuterol and that he has been trying the albuterol inhaler without any relief.   Physical Exam   Triage Vital Signs: ED Triage Vitals  Enc Vitals Group     BP 06/10/22 1731 (!) 162/85     Pulse Rate 06/10/22 1731 73     Resp 06/10/22 1731 (!) 24     Temp 06/10/22 1731 99.3 F (37.4 C)     Temp Source 06/10/22 1731 Oral     SpO2 06/10/22 1731 95 %     Weight 06/10/22 1732 218 lb (98.9 kg)     Height 06/10/22 1732 6\' 1"  (1.854 m)     Head Circumference --      Peak Flow --      Pain Score 06/10/22 1732 0     Pain Loc --      Pain Edu? --      Excl. in GC? --     Most recent vital signs: Vitals:   06/10/22 1731  BP: (!) 162/85  Pulse: 73  Resp: (!) 24  Temp: 99.3 F (37.4 C)  SpO2: 95%     General: Awake, no distress.  CV:  Good peripheral perfusion.  Resp:  Normal effort.  Mild wheeze noted Abd:  No distention.   Soft nontender Other:  No swelling in legs.  No calf tenderness   ED Results / Procedures / Treatments   Labs (all labs ordered are listed, but only abnormal results are displayed) Labs Reviewed  BASIC METABOLIC PANEL - Abnormal; Notable for the following components:      Result Value   Sodium 131 (*)    Glucose, Bld 140 (*)    Creatinine, Ser 1.46 (*)    Calcium 8.7 (*)    GFR, Estimated 56 (*)    All other components within normal limits  CBC - Abnormal; Notable for the following components:   RBC 3.52 (*)    Hemoglobin 12.1 (*)    HCT 36.5 (*)    MCV 103.7 (*)    MCH 34.4 (*)    All other components within normal limits  RESP PANEL BY RT-PCR (RSV, FLU A&B, COVID)  [id number]  TROPONIN I (HIGH SENSITIVITY)     EKG  My interpretation of EKG:  Normal sinus rate of 84 without any obvious  ST depression or T wave inversions there is a little bit of artifact in lead II and III  Repeat EKG sinus rate of 86 without any ST elevation, T wave version lead III, normal intervals  RADIOLOGY I have reviewed the xray personally and interpreted and no evidence of any pneumonia   PROCEDURES:  Critical Care performed: No  Procedures   MEDICATIONS ORDERED IN ED: Medications  ipratropium-albuterol (DUONEB) 0.5-2.5 (3) MG/3ML nebulizer solution 3 mL ( Nebulization Not Given 06/10/22 1844)     IMPRESSION / MDM / ASSESSMENT AND PLAN / ED COURSE  I reviewed the triage vital signs and the nursing notes.   Patient's presentation is most consistent with acute presentation with potential threat to life or bodily function.   Differential is asthma, flu, COVID, pneumonia, ACS he denies any chest pain.  He has no risk factors for PE other than he did have a long trip that he drove back in and around Christmas time.  We discussed workup for PE but he declined stating this feels like his normal asthma attacks.  He is got no evidence of DVT based upon examination and does not want further  workup for blood clot.  We did discuss return precautions in regards to DVT and PE and he expressed understanding.  Troponin was negative.  BMP shows slightly elevated creatinine 1.46 with a sodium of 131.  CBC shows no white count.  COVID test was positive.  Discussed with pharmacist Ouida Sills about the best treatment for his COVID given all of his medications he is on.  They recommended molnupiravir.  Discussed with patient and will do 5-day course of steroids given his asthma history as well as prescribing nebulized albuterol.  Patient is requesting discharge home.  He states he does not want to stay any longer.  He had difficulty getting IV access and has declined fluids or IV steroids.  He does not want to stay any longer.  We did discuss his slightly low sodium and chloride and creatinine being elevated and to increase hydration with Pedialyte.  His EKG does have some artifact in it but he does not want to stay any longer I have low suspicion for heart attack.  He understands that he is high risk with having COVID and that he should return to the ER if he develops worsening shortness of breath for consideration of admission but at this time he is not hypoxic and is requesting discharge.  The patient is on the cardiac monitor to evaluate for evidence of arrhythmia and/or significant heart rate changes.      FINAL CLINICAL IMPRESSION(S) / ED DIAGNOSES   Final diagnoses:  Asthma with acute exacerbation, unspecified asthma severity, unspecified whether persistent  COVID-19     Rx / DC Orders   ED Discharge Orders          Ordered    molnupiravir EUA (LAGEVRIO) 200 mg CAPS capsule  2 times daily        06/10/22 2052    predniSONE (DELTASONE) 20 MG tablet  Daily with breakfast        06/10/22 2052    albuterol (PROVENTIL) (2.5 MG/3ML) 0.083% nebulizer solution  Every 6 hours PRN        06/10/22 2052             Note:  This document was prepared using Dragon voice recognition  software and may include unintentional dictation errors.   Vanessa Lewiston, MD 06/10/22 2052    Jari Pigg,  Royetta Crochet, MD 06/10/22 2116    Vanessa Smithland, MD 06/10/22 2117

## 2022-06-10 NOTE — ED Triage Notes (Signed)
Pt to ED with asthma exacerbation since last night. Has been using home inhalers. Pursed lipo breathing and tachypnea noted. Able to speak in full sentences. Recent covid exposure.

## 2022-06-10 NOTE — Discharge Instructions (Addendum)
You are positive for COVID you should monitor your breathing at home and if you develop worsening shortness of breath return to the ER for recheck of your oxygen because you are high risk for requiring admission and oxygen but at this time you are requesting discharge home.  You can take Tylenol 1 g every 8 hours to help with fevers or pain.  Use the molnupiravir to help fight the COVID and the steroids and albuterol to help with your asthma.  Return to the ER if you develop worsening symptoms or any other concerns

## 2022-06-10 NOTE — ED Notes (Signed)
Second RN attempted IV, MD notified

## 2022-06-21 ENCOUNTER — Other Ambulatory Visit (HOSPITAL_COMMUNITY): Payer: Self-pay

## 2022-06-26 ENCOUNTER — Other Ambulatory Visit (HOSPITAL_COMMUNITY): Payer: Self-pay

## 2022-06-27 ENCOUNTER — Other Ambulatory Visit (HOSPITAL_COMMUNITY): Payer: Self-pay

## 2022-07-03 ENCOUNTER — Other Ambulatory Visit: Payer: Self-pay | Admitting: Orthopedic Surgery

## 2022-07-03 ENCOUNTER — Ambulatory Visit: Payer: BC Managed Care – PPO | Admitting: Infectious Diseases

## 2022-07-20 ENCOUNTER — Other Ambulatory Visit (HOSPITAL_COMMUNITY): Payer: Self-pay

## 2022-07-26 ENCOUNTER — Other Ambulatory Visit (HOSPITAL_COMMUNITY): Payer: Self-pay

## 2022-07-31 ENCOUNTER — Ambulatory Visit: Payer: BC Managed Care – PPO | Admitting: Infectious Diseases

## 2022-08-01 ENCOUNTER — Other Ambulatory Visit: Payer: Self-pay | Admitting: Orthopedic Surgery

## 2022-08-07 ENCOUNTER — Ambulatory Visit: Payer: BC Managed Care – PPO | Admitting: Infectious Diseases

## 2022-08-22 ENCOUNTER — Other Ambulatory Visit (HOSPITAL_COMMUNITY): Payer: Self-pay

## 2022-08-28 ENCOUNTER — Other Ambulatory Visit: Payer: Self-pay

## 2022-08-30 ENCOUNTER — Other Ambulatory Visit: Payer: Self-pay | Admitting: Orthopedic Surgery

## 2022-09-04 ENCOUNTER — Emergency Department (HOSPITAL_COMMUNITY): Payer: BC Managed Care – PPO

## 2022-09-04 ENCOUNTER — Inpatient Hospital Stay (HOSPITAL_COMMUNITY): Payer: BC Managed Care – PPO

## 2022-09-04 ENCOUNTER — Inpatient Hospital Stay (HOSPITAL_COMMUNITY)
Admission: EM | Admit: 2022-09-04 | Discharge: 2022-09-07 | DRG: 974 | Disposition: A | Discharge status: Home / Self Care | Payer: BC Managed Care – PPO | Attending: Internal Medicine | Admitting: Internal Medicine

## 2022-09-04 ENCOUNTER — Other Ambulatory Visit: Payer: Self-pay

## 2022-09-04 ENCOUNTER — Encounter (HOSPITAL_COMMUNITY): Payer: Self-pay

## 2022-09-04 ENCOUNTER — Ambulatory Visit: Payer: BC Managed Care – PPO | Admitting: Infectious Diseases

## 2022-09-04 DIAGNOSIS — D72829 Elevated white blood cell count, unspecified: Secondary | ICD-10-CM | POA: Diagnosis present

## 2022-09-04 DIAGNOSIS — N281 Cyst of kidney, acquired: Secondary | ICD-10-CM | POA: Diagnosis not present

## 2022-09-04 DIAGNOSIS — N179 Acute kidney failure, unspecified: Secondary | ICD-10-CM | POA: Diagnosis present

## 2022-09-04 DIAGNOSIS — D6959 Other secondary thrombocytopenia: Secondary | ICD-10-CM | POA: Diagnosis present

## 2022-09-04 DIAGNOSIS — J439 Emphysema, unspecified: Secondary | ICD-10-CM | POA: Diagnosis present

## 2022-09-04 DIAGNOSIS — R7989 Other specified abnormal findings of blood chemistry: Secondary | ICD-10-CM | POA: Diagnosis not present

## 2022-09-04 DIAGNOSIS — R609 Edema, unspecified: Secondary | ICD-10-CM | POA: Diagnosis not present

## 2022-09-04 DIAGNOSIS — K8020 Calculus of gallbladder without cholecystitis without obstruction: Secondary | ICD-10-CM | POA: Diagnosis not present

## 2022-09-04 DIAGNOSIS — R531 Weakness: Secondary | ICD-10-CM | POA: Diagnosis not present

## 2022-09-04 DIAGNOSIS — R6520 Severe sepsis without septic shock: Secondary | ICD-10-CM | POA: Diagnosis not present

## 2022-09-04 DIAGNOSIS — K7460 Unspecified cirrhosis of liver: Secondary | ICD-10-CM | POA: Diagnosis not present

## 2022-09-04 DIAGNOSIS — R051 Acute cough: Secondary | ICD-10-CM

## 2022-09-04 DIAGNOSIS — A419 Sepsis, unspecified organism: Secondary | ICD-10-CM | POA: Diagnosis not present

## 2022-09-04 DIAGNOSIS — R0902 Hypoxemia: Secondary | ICD-10-CM | POA: Diagnosis not present

## 2022-09-04 DIAGNOSIS — Z1152 Encounter for screening for COVID-19: Secondary | ICD-10-CM

## 2022-09-04 DIAGNOSIS — R000 Tachycardia, unspecified: Secondary | ICD-10-CM | POA: Diagnosis not present

## 2022-09-04 DIAGNOSIS — Z6828 Body mass index (BMI) 28.0-28.9, adult: Secondary | ICD-10-CM | POA: Diagnosis not present

## 2022-09-04 DIAGNOSIS — E876 Hypokalemia: Secondary | ICD-10-CM | POA: Diagnosis not present

## 2022-09-04 DIAGNOSIS — R112 Nausea with vomiting, unspecified: Secondary | ICD-10-CM | POA: Diagnosis not present

## 2022-09-04 DIAGNOSIS — Z79899 Other long term (current) drug therapy: Secondary | ICD-10-CM

## 2022-09-04 DIAGNOSIS — K828 Other specified diseases of gallbladder: Secondary | ICD-10-CM | POA: Diagnosis present

## 2022-09-04 DIAGNOSIS — D539 Nutritional anemia, unspecified: Secondary | ICD-10-CM | POA: Diagnosis not present

## 2022-09-04 DIAGNOSIS — R059 Cough, unspecified: Secondary | ICD-10-CM | POA: Diagnosis not present

## 2022-09-04 DIAGNOSIS — Z0389 Encounter for observation for other suspected diseases and conditions ruled out: Secondary | ICD-10-CM | POA: Diagnosis not present

## 2022-09-04 DIAGNOSIS — E538 Deficiency of other specified B group vitamins: Secondary | ICD-10-CM | POA: Diagnosis present

## 2022-09-04 DIAGNOSIS — B20 Human immunodeficiency virus [HIV] disease: Secondary | ICD-10-CM | POA: Diagnosis not present

## 2022-09-04 DIAGNOSIS — E8801 Alpha-1-antitrypsin deficiency: Secondary | ICD-10-CM | POA: Diagnosis not present

## 2022-09-04 DIAGNOSIS — F17210 Nicotine dependence, cigarettes, uncomplicated: Secondary | ICD-10-CM | POA: Diagnosis not present

## 2022-09-04 DIAGNOSIS — R197 Diarrhea, unspecified: Secondary | ICD-10-CM | POA: Diagnosis not present

## 2022-09-04 DIAGNOSIS — Z752 Other waiting period for investigation and treatment: Secondary | ICD-10-CM | POA: Diagnosis not present

## 2022-09-04 DIAGNOSIS — Q446 Cystic disease of liver: Secondary | ICD-10-CM | POA: Diagnosis not present

## 2022-09-04 DIAGNOSIS — I10 Essential (primary) hypertension: Secondary | ICD-10-CM | POA: Diagnosis not present

## 2022-09-04 DIAGNOSIS — D696 Thrombocytopenia, unspecified: Secondary | ICD-10-CM | POA: Diagnosis present

## 2022-09-04 DIAGNOSIS — K529 Noninfective gastroenteritis and colitis, unspecified: Secondary | ICD-10-CM | POA: Diagnosis present

## 2022-09-04 DIAGNOSIS — R1110 Vomiting, unspecified: Secondary | ICD-10-CM | POA: Diagnosis not present

## 2022-09-04 DIAGNOSIS — K769 Liver disease, unspecified: Secondary | ICD-10-CM | POA: Diagnosis present

## 2022-09-04 DIAGNOSIS — E669 Obesity, unspecified: Secondary | ICD-10-CM | POA: Diagnosis not present

## 2022-09-04 DIAGNOSIS — J9601 Acute respiratory failure with hypoxia: Secondary | ICD-10-CM | POA: Diagnosis not present

## 2022-09-04 DIAGNOSIS — Z880 Allergy status to penicillin: Secondary | ICD-10-CM

## 2022-09-04 DIAGNOSIS — K7689 Other specified diseases of liver: Secondary | ICD-10-CM | POA: Diagnosis not present

## 2022-09-04 DIAGNOSIS — R932 Abnormal findings on diagnostic imaging of liver and biliary tract: Secondary | ICD-10-CM | POA: Diagnosis not present

## 2022-09-04 LAB — COMPREHENSIVE METABOLIC PANEL
ALT: 36 U/L (ref 0–44)
AST: 34 U/L (ref 15–41)
Albumin: 3.5 g/dL (ref 3.5–5.0)
Alkaline Phosphatase: 101 U/L (ref 38–126)
Anion gap: 13 (ref 5–15)
BUN: 67 mg/dL — ABNORMAL HIGH (ref 6–20)
CO2: 21 mmol/L — ABNORMAL LOW (ref 22–32)
Calcium: 8.1 mg/dL — ABNORMAL LOW (ref 8.9–10.3)
Chloride: 102 mmol/L (ref 98–111)
Creatinine, Ser: 3.45 mg/dL — ABNORMAL HIGH (ref 0.61–1.24)
GFR, Estimated: 20 mL/min — ABNORMAL LOW (ref >60)
Glucose, Bld: 131 mg/dL — ABNORMAL HIGH (ref 70–99)
Potassium: 3.6 mmol/L (ref 3.5–5.1)
Sodium: 136 mmol/L (ref 135–145)
Total Bilirubin: 5.2 mg/dL — ABNORMAL HIGH (ref 0.3–1.2)
Total Protein: 8 g/dL (ref 6.5–8.1)

## 2022-09-04 LAB — CBC WITH DIFFERENTIAL/PLATELET
Abs Immature Granulocytes: 0.36 10*3/uL — ABNORMAL HIGH (ref 0.00–0.07)
Basophils Absolute: 0.1 10*3/uL (ref 0.0–0.1)
Basophils Relative: 0 %
Eosinophils Absolute: 0 10*3/uL (ref 0.0–0.5)
Eosinophils Relative: 0 %
HCT: 37.7 % — ABNORMAL LOW (ref 39.0–52.0)
Hemoglobin: 12.5 g/dL — ABNORMAL LOW (ref 13.0–17.0)
Immature Granulocytes: 1 %
Lymphocytes Relative: 4 %
Lymphs Abs: 1.6 10*3/uL (ref 0.7–4.0)
MCH: 34.1 pg — ABNORMAL HIGH (ref 26.0–34.0)
MCHC: 33.2 g/dL (ref 30.0–36.0)
MCV: 102.7 fL — ABNORMAL HIGH (ref 80.0–100.0)
Monocytes Absolute: 1.3 10*3/uL — ABNORMAL HIGH (ref 0.1–1.0)
Monocytes Relative: 3 %
Neutro Abs: 39.3 10*3/uL — ABNORMAL HIGH (ref 1.7–7.7)
Neutrophils Relative %: 92 %
Platelets: 130 10*3/uL — ABNORMAL LOW (ref 150–400)
RBC: 3.67 MIL/uL — ABNORMAL LOW (ref 4.22–5.81)
RDW: 12.8 % (ref 11.5–15.5)
WBC: 42.5 10*3/uL — ABNORMAL HIGH (ref 4.0–10.5)
nRBC: 0 % (ref 0.0–0.2)

## 2022-09-04 LAB — URINALYSIS, W/ REFLEX TO CULTURE (INFECTION SUSPECTED)
Bilirubin Urine: NEGATIVE
Glucose, UA: NEGATIVE mg/dL
Ketones, ur: NEGATIVE mg/dL
Nitrite: NEGATIVE
Protein, ur: 100 mg/dL — AB
Specific Gravity, Urine: 1.023 (ref 1.005–1.030)
WBC, UA: >50 WBC/hpf (ref 0–5)
pH: 5 (ref 5.0–8.0)

## 2022-09-04 LAB — MRSA NEXT GEN BY PCR, NASAL: MRSA by PCR Next Gen: NOT DETECTED

## 2022-09-04 LAB — FIBRINOGEN: Fibrinogen: >800 mg/dL — ABNORMAL HIGH (ref 210–475)

## 2022-09-04 LAB — RETICULOCYTES
Immature Retic Fract: 13.5 % (ref 2.3–15.9)
RBC.: 3.19 MIL/uL — ABNORMAL LOW (ref 4.22–5.81)
Retic Count, Absolute: 72.4 10*3/uL (ref 19.0–186.0)
Retic Ct Pct: 2.3 % (ref 0.4–3.1)

## 2022-09-04 LAB — LACTATE DEHYDROGENASE: LDH: 239 U/L — ABNORMAL HIGH (ref 98–192)

## 2022-09-04 LAB — LACTIC ACID, PLASMA
Lactic Acid, Venous: 1.8 mmol/L (ref 0.5–1.9)
Lactic Acid, Venous: 1.7 mmol/L (ref 0.5–1.9)

## 2022-09-04 LAB — D-DIMER, QUANTITATIVE: D-Dimer, Quant: 10.62 ug/mL-FEU — ABNORMAL HIGH (ref 0.00–0.50)

## 2022-09-04 LAB — RESP PANEL BY RT-PCR (RSV, FLU A&B, COVID)  RVPGX2
Influenza A by PCR: NEGATIVE
Influenza B by PCR: NEGATIVE
Resp Syncytial Virus by PCR: NEGATIVE
SARS Coronavirus 2 by RT PCR: NEGATIVE

## 2022-09-04 LAB — URINE CULTURE

## 2022-09-04 LAB — PROTIME-INR
INR: 1.3 — ABNORMAL HIGH (ref 0.8–1.2)
Prothrombin Time: 16.2 seconds — ABNORMAL HIGH (ref 11.4–15.2)

## 2022-09-04 LAB — APTT: aPTT: 38 seconds — ABNORMAL HIGH (ref 24–36)

## 2022-09-04 LAB — BILIRUBIN, DIRECT: Bilirubin, Direct: 1.1 mg/dL — ABNORMAL HIGH (ref 0.0–0.2)

## 2022-09-04 LAB — CULTURE, BLOOD (ROUTINE X 2)

## 2022-09-04 MED ORDER — LACTATED RINGERS IV SOLN: 150.0000 mL/h | INTRAVENOUS | Status: DC

## 2022-09-04 MED ORDER — VANCOMYCIN VARIABLE DOSE PER UNSTABLE RENAL FUNCTION (PHARMACIST DOSING): Status: DC

## 2022-09-04 MED ORDER — METRONIDAZOLE 500 MG/100ML IV SOLN
500.0000 mg | Freq: Once | INTRAVENOUS | Status: AC
  Administered 2022-09-04: 500 mg via INTRAVENOUS
  Filled 2022-09-04: qty 100

## 2022-09-04 MED ORDER — SODIUM CHLORIDE 0.9 % IV SOLN
2.0000 g | Freq: Two times a day (BID) | INTRAVENOUS | Status: DC
  Administered 2022-09-04: 2 g via INTRAVENOUS
  Filled 2022-09-04 (×2): qty 10

## 2022-09-04 MED ORDER — ORAL CARE MOUTH RINSE: 15.0000 mL | OROMUCOSAL | Status: DC | PRN

## 2022-09-04 MED ORDER — SODIUM CHLORIDE 0.9 % IV SOLN
2.0000 g | Freq: Once | INTRAVENOUS | Status: AC
  Administered 2022-09-04: 2 g via INTRAVENOUS
  Filled 2022-09-04: qty 10

## 2022-09-04 MED ORDER — AMLODIPINE BESYLATE 5 MG PO TABS
5.0000 mg | ORAL_TABLET | Freq: Every day | ORAL | Status: DC
  Administered 2022-09-04 – 2022-09-07 (×4): 5 mg via ORAL
  Filled 2022-09-04 (×4): qty 1

## 2022-09-04 MED ORDER — ONDANSETRON HCL 4 MG/2ML IJ SOLN
4.0000 mg | Freq: Four times a day (QID) | INTRAMUSCULAR | Status: DC | PRN
  Administered 2022-09-04: 4 mg via INTRAVENOUS
  Filled 2022-09-04: qty 2

## 2022-09-04 MED ORDER — ONDANSETRON HCL 4 MG PO TABS: 4.0000 mg | ORAL_TABLET | Freq: Four times a day (QID) | ORAL | Status: DC | PRN

## 2022-09-04 MED ORDER — TECHNETIUM TO 99M ALBUMIN AGGREGATED
4.4000 | Freq: Once | INTRAVENOUS | Status: AC | PRN
  Administered 2022-09-04: 4.4 via INTRAVENOUS

## 2022-09-04 MED ORDER — ALBUTEROL SULFATE (2.5 MG/3ML) 0.083% IN NEBU: 2.5000 mg | INHALATION_SOLUTION | Freq: Four times a day (QID) | RESPIRATORY_TRACT | Status: DC | PRN

## 2022-09-04 MED ORDER — ACETAMINOPHEN 325 MG PO TABS: 650.0000 mg | ORAL_TABLET | Freq: Four times a day (QID) | ORAL | Status: DC | PRN

## 2022-09-04 MED ORDER — VANCOMYCIN HCL IN DEXTROSE 1-5 GM/200ML-% IV SOLN
1000.0000 mg | Freq: Once | INTRAVENOUS | Status: AC
  Administered 2022-09-04: 1000 mg via INTRAVENOUS
  Filled 2022-09-04: qty 200

## 2022-09-04 MED ORDER — ONDANSETRON 8 MG PO TBDP
8.0000 mg | ORAL_TABLET | Freq: Once | ORAL | Status: AC
  Administered 2022-09-04: 8 mg via ORAL
  Filled 2022-09-04: qty 1

## 2022-09-04 MED ORDER — METRONIDAZOLE 500 MG/100ML IV SOLN
500.0000 mg | Freq: Two times a day (BID) | INTRAVENOUS | Status: DC
  Administered 2022-09-04 – 2022-09-07 (×6): 500 mg via INTRAVENOUS
  Filled 2022-09-04 (×6): qty 100

## 2022-09-04 MED ORDER — LACTATED RINGERS IV BOLUS (SEPSIS)
1000.0000 mL | Freq: Once | INTRAVENOUS | Status: AC
  Administered 2022-09-04: 1000 mL via INTRAVENOUS

## 2022-09-04 MED ORDER — PROCHLORPERAZINE EDISYLATE 10 MG/2ML IJ SOLN
10.0000 mg | Freq: Four times a day (QID) | INTRAMUSCULAR | Status: DC | PRN
  Administered 2022-09-04: 10 mg via INTRAVENOUS
  Filled 2022-09-04: qty 2

## 2022-09-04 MED ORDER — CHLORHEXIDINE GLUCONATE CLOTH 2 % EX PADS
6.0000 | MEDICATED_PAD | Freq: Every day | CUTANEOUS | Status: DC
  Administered 2022-09-04 – 2022-09-07 (×3): 6 via TOPICAL

## 2022-09-04 MED ORDER — BENZONATATE 100 MG PO CAPS
200.0000 mg | ORAL_CAPSULE | Freq: Once | ORAL | Status: AC
  Administered 2022-09-04: 200 mg via ORAL
  Filled 2022-09-04: qty 2

## 2022-09-04 MED ORDER — LACTATED RINGERS IV SOLN: INTRAVENOUS | Status: AC

## 2022-09-04 MED ORDER — IPRATROPIUM-ALBUTEROL 0.5-2.5 (3) MG/3ML IN SOLN
3.0000 mL | Freq: Once | RESPIRATORY_TRACT | Status: AC
  Administered 2022-09-04: 3 mL via RESPIRATORY_TRACT
  Filled 2022-09-04: qty 3

## 2022-09-04 MED ORDER — ACETAMINOPHEN 650 MG RE SUPP: 650.0000 mg | Freq: Four times a day (QID) | RECTAL | Status: DC | PRN

## 2022-09-04 NOTE — ED Triage Notes (Signed)
Patient reports SOB since Friday, thought he was getting a cold but feels worse this morning. Endorses cough and states when he coughs he vomits.

## 2022-09-04 NOTE — Plan of Care (Signed)
Discussed with patient plan for the evening, pain management and temperature control in the room with some teach back displayed.  Patient given blanket from warmer and an extra urine bottle.  Problem: Education: Goal: Knowledge of General Education information will improve Description: Including pain rating scale, medication(s)/side effects and non-pharmacologic comfort measures Outcome: Progressing   Problem: Health Behavior/Discharge Planning: Goal: Ability to manage health-related needs will improve Outcome: Progressing

## 2022-09-04 NOTE — Consult Note (Signed)
[facility] Consultation Note  Referring Provider: [facility] Primary Care Physician:  Jake Bathe, NP Primary Gastroenterologist:  Gentry Fitz  Reason for Consultation:  nausea, vomiting, diarrhea, abnormal imaging.  HPI: David [name] is a 57 y.o. male admitted nausea, vomiting, diarrhea.  On imaging found to have suspected cirrhosis, liver cystic and other non-descript liver lesions, possible emphysema.  No prior known history of liver or lung disease.  His diarrhea and nausea/vomiting much improved since admission.  No blood in stool or hematemesis.   Past Medical History:  Diagnosis Date   Asthma    History of syphilis 06/13/2021   Treated 4 weeks doxycycline January 2022 for titer 1:128   HIV (human immunodeficiency virus infection)     History reviewed. No pertinent surgical history.  Prior to Admission medications   Medication Sig Start Date End Date Taking? Authorizing Provider  albuterol (PROVENTIL) (2.5 MG/3ML) 0.083% nebulizer solution Take 3 mLs (2.5 mg total) by nebulization every 6 (six) hours as needed for wheezing or shortness of breath. 06/10/22   Concha Se, MD  albuterol (VENTOLIN HFA) 108 (90 Base) MCG/ACT inhaler Inhale 2 puffs into the lungs every 6 (six) hours as needed for wheezing or shortness of breath. 06/13/21   Blanchard Kelch, NP  ascorbic acid (VITAMIN C) 500 MG tablet Take 1 tablet (500 mg total) by mouth daily. 05/23/20   Arnetha Courser, MD  bictegravir-emtricitabine-tenofovir AF (BIKTARVY) 50-200-25 MG TABS tablet Take 1 tablet by mouth daily. 12/19/21   Blanchard Kelch, NP  predniSONE (DELTASONE) 10 MG tablet TAKE 1 TABLET (10 MG TOTAL) BY MOUTH DAILY WITH BREAKFAST. 08/30/22   Nadara Mustard, MD  zinc sulfate 220 (50 Zn) MG capsule Take 1 capsule (220 mg total) by mouth daily. 05/23/20   Arnetha Courser, MD    Current Facility-Administered Medications  Medication Dose Route Frequency Provider Last Rate Last Admin    acetaminophen (TYLENOL) tablet 650 mg  650 mg Oral Q6H PRN Bobette Mo, MD       Or   acetaminophen (TYLENOL) suppository 650 mg  650 mg Rectal Q6H PRN Bobette Mo, MD       albuterol (PROVENTIL) (2.5 MG/3ML) 0.083% nebulizer solution 2.5 mg  2.5 mg Nebulization Q6H PRN Bobette Mo, MD       amLODipine (NORVASC) tablet 5 mg  5 mg Oral Daily Bobette Mo, MD   5 mg at 09/04/22 1202   aztreonam (AZACTAM) 2 g in sodium chloride 0.9 % 100 mL IVPB  2 g Intravenous Q12H Bobette Mo, MD       Chlorhexidine Gluconate Cloth 2 % PADS 6 each  6 each Topical Daily Bobette Mo, MD   6 each at 09/04/22 1151   lactated ringers infusion   Intravenous Continuous Palumbo, April, MD 150 mL/hr at 09/04/22 0703 New Bag at 09/04/22 0703   metroNIDAZOLE (FLAGYL) IVPB 500 mg  500 mg Intravenous Q12H Bobette Mo, MD       ondansetron [facility]) tablet 4 mg  4 mg Oral Q6H PRN Bobette Mo, MD       Or   ondansetron [facility]) injection 4 mg  4 mg Intravenous Q6H PRN Bobette Mo, MD       Oral care mouth rinse  15 mL Mouth Rinse PRN Bobette Mo, MD       vancomycin variable dose per unstable renal function (pharmacist dosing)   Does not apply See admin instructions Robb Matar,  Ernestene Kielavid Manuel, MD        Allergies as of 09/04/2022 - Review Complete 09/04/2022  Allergen Reaction Noted   Penicillins Swelling 08/14/2017    Family History  Problem Relation Age of Onset   Cancer Mother     Social History   Socioeconomic History   Marital status: Single    Spouse name: Not on file   Number of children: Not on file   Years of education: Not on file   Highest education level: Not on file  Occupational History   Not on file  Tobacco Use   Smoking status: Every Day    Packs/day: .3    Types: Cigarettes   Smokeless tobacco: Never  Vaping Use   Vaping Use: Never used  Substance and Sexual Activity   Alcohol use: No   Drug use: No   Sexual  activity: Not on file  Other Topics Concern   Not on file  Social History Narrative   ** Merged History Encounter **       Social Determinants of Health   Financial Resource Strain: Not on file  Food Insecurity: No Food Insecurity (09/04/2022)   Hunger Vital Sign    Worried About Running Out of Food in the Last Year: Never true    Ran Out of Food in the Last Year: Never true  Transportation Needs: No Transportation Needs (09/04/2022)   PRAPARE - Administrator, Civil ServiceTransportation    Lack of Transportation (Medical): No    Lack of Transportation (Non-Medical): No  Physical Activity: Not on file  Stress: Not on file  Social Connections: Not on file  Intimate Partner Violence: Not At Risk (09/04/2022)   Humiliation, Afraid, Rape, and Kick questionnaire    Fear of Current or Ex-Partner: No    Emotionally Abused: No    Physically Abused: No    Sexually Abused: No    Review of Systems: As per HPI, all others negative  Physical Exam: Vital signs in last 24 hours: Temp:  [98.2 F (36.8 C)-98.5 F (36.9 C)] 98.5 F (36.9 C) (04/08 0700) Pulse Rate:  [99-115] 109 (04/08 1202) Resp:  [17-27] 19 (04/08 1202) BP: (117-177)/(82-106) 125/93 (04/08 1202) SpO2:  [90 %-98 %] 91 % (04/08 1202) Weight:  [98.9 kg] 98.9 kg (04/08 0925) Last BM Date :  (PTA) General:   Alert,  Well-developed, well-nourished, pleasant and cooperative in NAD Head:  Normocephalic and atraumatic. Eyes:  Sclera clear, no icterus.   Conjunctiva pink. Ears:  Normal auditory acuity. Nose:  No deformity, discharge,  or lesions. Mouth:  No deformity or lesions.  Oropharynx pink & moist. Neck:  Supple; no masses or thyromegaly. Lungs:  No respiratory distress CV:  Tachycardic, regular Abdomen:  Soft, very mild tenderness without peritonitis, nondistended. No masses, hepatosplenomegaly or hernias noted. Normal bowel sounds, without guarding, and without rebound.     Msk:  Symmetrical without gross deformities. Normal posture. Pulses:   Normal pulses noted. Extremities:  Without clubbing or edema. Neurologic:  Alert and  oriented x4;  grossly normal neurologically. Skin:  Intact without significant lesions or rashes. Psych:  Alert and cooperative. Normal mood and affect.   Lab Results: Recent Labs    09/04/22 0609  WBC 42.5*  HGB 12.5*  HCT 37.7*  PLT 130*   BMET Recent Labs    09/04/22 0609  NA 136  K 3.6  CL 102  CO2 21*  GLUCOSE 131*  BUN 67*  CREATININE 3.45*  CALCIUM 8.1*   LFT Recent Labs  09/04/22 0609  PROT 8.0  ALBUMIN 3.5  AST 34  ALT 36  ALKPHOS 101  BILITOT 5.2*  BILIDIR 1.1*   PT/INR Recent Labs    09/04/22 0609  LABPROT 16.2*  INR 1.3*    Studies/Results: NM Pulmonary Perfusion  Result Date: 09/04/2022 CLINICAL DATA:  PE EXAM: NUCLEAR MEDICINE PERFUSION LUNG SCAN TECHNIQUE: Perfusion images were obtained in multiple projections after intravenous injection of radiopharmaceutical. Ventilation scans intentionally deferred if perfusion scan and chest x-ray adequate for interpretation during COVID 19 epidemic. RADIOPHARMACEUTICALS:  4.4 mCi Tc-24m MAA IV COMPARISON:  None Available. FINDINGS: Perfusion images demonstrate no evidence of significant segmental or subsegmental perfusion defects to suggest the presence of PE. IMPRESSION: No perfusion defects identified to suggest PE. Electronically Signed   By: Layla Maw M.D.   On: 09/04/2022 13:42   US Abdomen Limited RUQ (LIVER/GB)  Result Date: 09/04/2022 CLINICAL DATA:  73059 Hyperbilirubinemia 10626.  HIV EXAM: ULTRASOUND ABDOMEN LIMITED RIGHT UPPER QUADRANT COMPARISON:  None Available. FINDINGS: Gallbladder: Gallbladder sludge within the gallbladder lumen. Gallbladder wall thickening. No pericholecystic fluid. No sonographic Murphy sign noted by sonographer. Common bile duct: Diameter: 7 mm. Liver: Total of 3 indeterminate hepatic lesions identified: 2.8 x 1.9 x 2.5 cm cystic lesion, 2.1 x 1.6 x 1.9 cm cystic lesion, 1.2 x 1.1  x 0.9 cm solid hyperechoic lesion. Nodular hepatic contour. Coarsened parenchymal echogenicity. Portal vein is patent on color Doppler imaging with normal direction of blood flow towards the liver. Other: None. IMPRESSION: 1. Gallbladder sludge with nonspecific gallbladder wall thickening which can be seen in the setting of chronic hepatic disease. No definite findings of acute cholecystitis. 2. Cirrhotic morphology of the liver with three indeterminate left hepatic lobe lesions, two cystic and one solid hyperechoic. Hepatocellular carcinoma cannot be excluded in the setting of cirrhosis. Recommend MRI liver protocol for further evaluation. When the patient is clinically stable and able to follow directions and hold their breath (preferably as an outpatient) further evaluation with dedicated abdominal MRI should be considered. Electronically Signed   By: Tish Frederickson M.D.   On: 09/04/2022 08:47   CT Chest Wo Contrast  Result Date: 09/04/2022 CLINICAL DATA:  SOB EXAM: CT CHEST WITHOUT CONTRAST TECHNIQUE: Multidetector CT imaging of the chest was performed following the standard protocol without IV contrast. RADIATION DOSE REDUCTION: This exam was performed according to the departmental dose-optimization program which includes automated exposure control, adjustment of the mA and/or kV according to patient size and/or use of iterative reconstruction technique. COMPARISON:  None Available. FINDINGS: Cardiovascular: No significant vascular findings. Normal heart size. No pericardial effusion. Mediastinum/Nodes: No enlarged mediastinal or axillary lymph nodes. Thyroid gland, trachea, and esophagus demonstrate no significant findings. Lungs/Pleura: Extensive diffuse bilateral pulmonary parenchymal scarring consistent with panacinar emphysema. Minimal bibasilar subsegmental atelectasis. No pleural or pericardial effusion. Upper Abdomen: Innumerable hepatic lesions consistent with cysts. No adrenal lesions identified.  Musculoskeletal: No chest wall mass or suspicious bone lesions identified. IMPRESSION: Extensive diffuse panacinar emphysematous changes of the lungs and innumerable cysts in the liver. These findings suggest alpha-1 antitrypsin deficiency. Electronically Signed   By: Layla Maw M.D.   On: 09/04/2022 08:31   DG Chest 2 View  Result Date: 09/04/2022 CLINICAL DATA:  57 year old male with coughing, weakness, vomiting. EXAM: CHEST - 2 VIEW COMPARISON:  Chest radiographs 06/10/2022 and earlier. FINDINGS: PA and lateral views at 0604 hours. Chronic flattening of the diaphragms suggesting pulmonary hyperinflation. Mediastinal contours remain normal. Visualized tracheal air column is within normal  limits. No pneumothorax, pulmonary edema, pleural effusion or confluent pulmonary opacity. No acute osseous abnormality identified. Paucity of bowel gas in the upper abdomen. IMPRESSION: No acute cardiopulmonary abnormality. Suspicion of chronic pulmonary hyperinflation. Electronically Signed   By: Odessa Fleming M.D.   On: 09/04/2022 06:13    Impression:   Acute nausea, vomiting, diarrhea. HIV, on retroviral therapy, viral load undetectable last year and current lymphocyte count normal on CBC. Abnormal imaging:  Suspected emphysema and cirrhosis.  Liver lesions unclear etiology.  Plan:   Patient's symptoms most typical of gastroenteritis.  His symptoms are improving; as such, don't necessarily see need for stool studies, etc. Supportive management of nausea and diarrhea with IV fluids, antiemetics, supportive care.  Not sure I see need for antibiotics in absence of clear targeted source being treated. Diet trial as tolerated. Concerning liver disease, patient will first need alpha-fetoprotein and CEA and Ca 19-9 (labs I can order now) as well as MRI liver.  Labs I can order today.  Would like to see some clinical improvement prior to doing non-urgent MRI liver.  Patient will need outpatient follow-up in GI with [place]  and also will need outpatient hepatology consultation at liver transplant center with expertise in possible A1AT deficiency. [facility] will follow.   LOS: 0 days   Guiseppe Flanagan M  09/04/2022, 1:52 PM  Cell [phone] If no answer or after 5 PM call [phone]

## 2022-09-04 NOTE — ED Provider Notes (Addendum)
  Physical Exam  BP (!) 137/98   Pulse (!) 107   Temp 98.2 F (36.8 C) (Oral)   Resp 17   Ht 6\' 1"  (1.854 m)   Wt 98.9 kg   SpO2 98%   BMI 28.76 kg/m   Physical Exam  Procedures  .Critical Care  Performed by: Derwood Kaplan, MD Authorized by: Derwood Kaplan, MD   Critical care provider statement:    Critical care time (minutes):  36   Critical care was necessary to treat or prevent imminent or life-threatening deterioration of the following conditions:  Circulatory failure, renal failure and hepatic failure   Critical care was time spent personally by me on the following activities:  Development of treatment plan with patient or surrogate, discussions with consultants, evaluation of patient's response to treatment, examination of patient, ordering and review of laboratory studies, ordering and review of radiographic studies, ordering and performing treatments and interventions, pulse oximetry, re-evaluation of patient's condition, review of old charts and obtaining history from patient or surrogate   ED Course / MDM    Medical Decision Making Amount and/or Complexity of Data Reviewed Labs: ordered. Radiology: ordered. ECG/medicine tests: ordered.  Risk Prescription drug management. Decision regarding hospitalization.   Pt comes in with cc of shob and weakness. Pt has hx of HIV. O2 sats around 93%.  Mild tachypnea, no respiratory distress.  CT-PE ordered, as CXR is not showing clear infection.    Derwood Kaplan, MD 09/04/22 [phone]  At 7:20 AM: Patient is noted to have new AKI, elevated bilirubin.  Patient states that has been feeling sick for the last 3 days.  He has flulike symptoms.  He had an episode of nausea with vomiting 2 days ago.  He was having some loose bowel movements with cough.  Cough was initially producing green phlegm, but now mostly clear.  He has no appetite.  He is compliant with his medications and no recent travel history.  Given that he has  a elevated creatinine, he is not a candidate for CT PE. We will switch the CAT scan to CT chest without contrast.  I will proceed with admission request at this time.  Elevated bilirubin in the setting of normal LFTs and also normal abdominal exam could be because of cholestasis.  Will defer further management to admitting service.  Sepsis and hemolysis can also be a possibility.  Additionally, patient noted to have elevated white count of over 40,000.  He does not have any history of cancer.  Patient has received broad-spectrum antibiotics and 2 L of IV fluid.     Derwood Kaplan, MD 09/04/22 [phone]

## 2022-09-04 NOTE — H&P (Signed)
History and Physical    Patient: David [name] DOB: 11/10/1965 DOA: 09/04/2022 DOS: the patient was seen and examined on 09/04/2022 PCP: Jake Batheixon, Alysia L, NP  Patient coming from: Home  Chief Complaint:  Chief Complaint  Patient presents with   Shortness of Breath   Emesis   HPI: David [name] is a 57 y.o. male with medical history significant of asthma, former 70-pack-year smoker, history of syphilis, HIV disease on antiretroviral therapy, hypertension, overweight who is coming to the emergency department with complaints of 5-6 episodes of diarrhea that started over the weekend, associated with 1 episode of emesis, mild abdominal discomfort, subjective fever/night sweats/chills on Saturday evening, fatigue, dyspnea and occasionally productive cough for clear sputum.  No sick contacts or travel history.  No constipation, melena or hematochezia.  No flank pain, dysuria, frequency or hematuria.He denied sore throat, wheezing or hemoptysis.  No chest pain, palpitations, diaphoresis, PND, orthopnea or pitting edema of the lower extremities.   No polyuria, polydipsia, polyphagia or blurred vision.   ED course: Initial vital signs were temperature 98.2 F, pulse 111, respiration 19, BP 140/93 mmHg O2 sat 91% on room air.  The patient received 3000 mL of LR bolus, ondansetron 8 mg ODT tablet, benzonatate 200 mg p.o. x 1, aztreonam, metronidazole and vancomycin in the emergency department.  Lab work: His urinalysis was amber, moderate hemoglobin, small leukocyte esterase, proteinuria 100 mg/deciliter, more than 50 WBC and few bacteria microscopic examination.  Negative coronavirus, influenza and RSV PCR.  CBC showed a white count of 42.5, hemoglobin 12.5 g/dL and platelets 147130.  PT 16.2, INR 1.3 and PTT 38.  Fibrinogen was more than 800 mg/deciliter and D-dimer 10.62 mcg/mL.  Lactic acid was normal twice.  CMP showed normal sodium, potassium and chloride.  CO2 was 21 mmol/L with a  normal anion gap.  Glucose 131, BUN 67, creatinine 3.45, calcium 8.1 and total bilirubin 5.2 mg/dL.  Direct bilirubin was 1.1 mg/dL the rest of the LFTs were normal.  LDH 239 units/L.  Imaging: 2 view chest radiograph with no acute cardiopulmonary abnormality.  Suspicion of chronic pulmonary hyperinflation, CT chest without contrast with extensive diffuse panacinar emphysematous changes of the lungs and numerable 6 in the liver.  These findings suggest alpha-1 antitrypsin deficiency.  RUQ US with gallbladder sludge with nonspecific gallbladder wall thickening which can be seen in the setting of chronic hepatic disease.  No definite findings of acute cholecystitis.  Cirrhotic morphology of the liver with 3 indeterminate left hepatic lobe lesions, 2 cystic and 1 solid hyperechoic.  Hepatocellular carcinoma cannot be excluded in the setting of cirrhosis.  MRI of the liver recommended for further evaluation.   Review of Systems: As mentioned in the history of present illness. All other systems reviewed and are negative.  Past Medical History:  Diagnosis Date   Asthma    HIV (human immunodeficiency virus infection)    History reviewed. No pertinent surgical history. Social History:  reports that he has been smoking cigarettes. He has been smoking an average of .3 packs per day. He has never used smokeless tobacco. He reports that he does not drink alcohol and does not use drugs.  Allergies  Allergen Reactions   Penicillins Swelling    Family History  Problem Relation Age of Onset   Cancer Mother     Prior to Admission medications   Medication Sig Start Date End Date Taking? Authorizing Provider  albuterol (PROVENTIL) (2.5 MG/3ML) 0.083% nebulizer solution Take 3 mLs (  2.5 mg total) by nebulization every 6 (six) hours as needed for wheezing or shortness of breath. 06/10/22   Concha Se, MD  albuterol (VENTOLIN HFA) 108 (90 Base) MCG/ACT inhaler Inhale 2 puffs into the lungs every 6 (six) hours  as needed for wheezing or shortness of breath. 06/13/21   Blanchard Kelch, NP  ascorbic acid (VITAMIN C) 500 MG tablet Take 1 tablet (500 mg total) by mouth daily. 05/23/20   Arnetha Courser, MD  bictegravir-emtricitabine-tenofovir AF (BIKTARVY) 50-200-25 MG TABS tablet Take 1 tablet by mouth daily. 12/19/21   Blanchard Kelch, NP  predniSONE (DELTASONE) 10 MG tablet TAKE 1 TABLET (10 MG TOTAL) BY MOUTH DAILY WITH BREAKFAST. 08/30/22   Nadara Mustard, MD  zinc sulfate 220 (50 Zn) MG capsule Take 1 capsule (220 mg total) by mouth daily. 05/23/20   Arnetha Courser, MD    Physical Exam: Vitals:   09/04/22 0546 09/04/22 0549 09/04/22 0558 09/04/22 0632  BP: (!) 140/93   (!) 137/98  Pulse: (!) 111  (!) 106 (!) 107  Resp: 19  17   Temp: 98.2 F (36.8 C)     TempSrc: Oral     SpO2: 91%  93% 98%  Weight:  98.9 kg    Height:  6\' 1"  (1.854 m)     Physical Exam Vitals and nursing note reviewed.  Constitutional:      General: He is awake.     Appearance: He is well-developed and overweight.  HENT:     Head: Normocephalic.     Nose: No rhinorrhea.     Mouth/Throat:     Mouth: Mucous membranes are dry.  Eyes:     General: No scleral icterus.    Pupils: Pupils are equal, round, and reactive to light.  Neck:     Vascular: No JVD.  Cardiovascular:     Rate and Rhythm: Normal rate and regular rhythm.     Heart sounds: S1 normal and S2 normal.  Pulmonary:     Effort: Pulmonary effort is normal.     Breath sounds: Normal breath sounds. No wheezing, rhonchi or rales.  Abdominal:     General: Bowel sounds are normal.     Palpations: Abdomen is soft.     Tenderness: There is no abdominal tenderness. There is no right CVA tenderness or left CVA tenderness.  Musculoskeletal:     Cervical back: Neck supple.     Right lower leg: No edema.     Left lower leg: No edema.  Skin:    General: Skin is warm and dry.  Neurological:     General: No focal deficit present.     Mental Status: He is alert  and oriented to person, place, and time.  Psychiatric:        Mood and Affect: Mood normal.        Behavior: Behavior normal. Behavior is cooperative.   Data Reviewed:  Results are pending, will review when available.  Assessment and Plan: Principal Problem:   Gastroenteritis-like symptoms Causing:   Sepsis due to undetermined organism Admit to SDU/inpatient. Continue IV fluids. Continue aztreonam 2 g every 8 hours.   Continue metronidazole 500 mg IVPB q 12 hr. Continue vancomycin per pharmacy. Follow-up blood culture and sensitivity Follow CBC and CMP in a.m.  Active Problems:   AKI (acute kidney injury) Continue IV fluids. Avoid hypotension. Avoid nephrotoxins. Monitor intake and output. Monitor renal function electrolytes.    Hypertension Begin amlodipine 5 mg p.o.  daily. Monitor blood pressure and heart rate.    Emphysema due to alpha-1-antitrypsin deficiency Briefly discussed with pulmonology. Will need OP follow-up at the pulmonary clinic. Will formally consult if the patient is respiratory status worsen.    Positive D dimer Check VQ scan given renal function.    Thrombocytopenia In the setting of sepsis and HIV disease. Monitor platelet count daily.    Leukocytosis In the setting of sepsis. Continue antibiotics as above.    Macrocytic anemia Check B12 and folate levels. Monitor hematocrit and hemoglobin.    HIV disease Biktarvy held due to renal function.    Hypocalcemia Recheck calcium level in AM. Further workup depending on results.    Hyperbilirubinemia GI consult requested. Will follow recommendations.    Liver lesions Will try to obtain liver MRI during this hospitalization.    Advance Care Planning:   Code Status: Full Code   Consults: Gastroenterology Chrissie Noa outlaw MD).  Family Communication:   Severity of Illness: The appropriate patient status for this patient is INPATIENT. Inpatient status is judged to be reasonable and  necessary in order to provide the required intensity of service to ensure the patient's safety. The patient's presenting symptoms, physical exam findings, and initial radiographic and laboratory data in the context of their chronic comorbidities is felt to place them at high risk for further clinical deterioration. Furthermore, it is not anticipated that the patient will be medically stable for discharge from the hospital within 2 midnights of admission.   * I certify that at the point of admission it is my clinical judgment that the patient will require inpatient hospital care spanning beyond 2 midnights from the point of admission due to high intensity of service, high risk for further deterioration and high frequency of surveillance required.*  Author: Bobette Mo, MD 09/04/2022 8:00 AM  For on call review [email or web address].   This document was prepared using Dragon voice recognition software and may contain some unintended transcription errors.

## 2022-09-04 NOTE — Sepsis Progress Note (Signed)
Elink following for sepsis protocol. 

## 2022-09-04 NOTE — Progress Notes (Signed)
A consult was received from an ED physician for azactam and vancomycin per pharmacy dosing.  The patient's profile has been reviewed for ht/wt/allergies/indication/available labs.   A one time order has been placed for azactam 2gm and vanc 1gm.    Further antibiotics/pharmacy consults should be ordered by admitting physician if indicated.                       Thank you, Arley Phenix RPh 09/04/2022, 5:58 AM

## 2022-09-04 NOTE — ED Provider Notes (Addendum)
[place] [facility] Provider Note   CSN: [id number] Arrival date & time: 09/04/22  [id number]     History  Chief Complaint  Patient presents with   Shortness of Breath   Emesis    David [name] is a 57 y.o. male.  The history is provided by the patient.  Shortness of Breath Severity:  Severe Onset quality:  Gradual Timing:  Constant Chronicity:  New Relieved by:  Nothing Worsened by:  Nothing Ineffective treatments:  None tried Associated symptoms: cough, sputum production and vomiting   Associated symptoms: no fever and no wheezing   Associated symptoms comment:  Diarrhea Risk factors: no hx of PE/DVT and no obesity   Emesis Associated symptoms: cough and diarrhea   Associated symptoms: no fever   Patient with HIV presents with URI and cough and n/v/d and worsening SOB since Friday.  No known fevers.      Past Medical History:  Diagnosis Date   Asthma    HIV (human immunodeficiency virus infection)      Home Medications Prior to Admission medications   Medication Sig Start Date End Date Taking? Authorizing Provider  albuterol (PROVENTIL) (2.5 MG/3ML) 0.083% nebulizer solution Take 3 mLs (2.5 mg total) by nebulization every 6 (six) hours as needed for wheezing or shortness of breath. 06/10/22   Concha SeFunke, Mary E, MD  albuterol (VENTOLIN HFA) 108 (90 Base) MCG/ACT inhaler Inhale 2 puffs into the lungs every 6 (six) hours as needed for wheezing or shortness of breath. 06/13/21   Blanchard Kelchixon, Stephanie N, NP  ascorbic acid (VITAMIN C) 500 MG tablet Take 1 tablet (500 mg total) by mouth daily. 05/23/20   Arnetha CourserAmin, Sumayya, MD  bictegravir-emtricitabine-tenofovir AF (BIKTARVY) 50-200-25 MG TABS tablet Take 1 tablet by mouth daily. 12/19/21   Blanchard Kelchixon, Stephanie N, NP  predniSONE (DELTASONE) 10 MG tablet TAKE 1 TABLET (10 MG TOTAL) BY MOUTH DAILY WITH BREAKFAST. 08/30/22   Nadara Mustarduda, Marcus V, MD  zinc sulfate 220 (50 Zn) MG capsule Take 1 capsule (220 mg  total) by mouth daily. 05/23/20   Arnetha CourserAmin, Sumayya, MD      Allergies    Penicillins    Review of Systems   Review of Systems  Constitutional:  Negative for fever.  HENT:  Negative for facial swelling.   Respiratory:  Positive for cough, sputum production and shortness of breath. Negative for wheezing.   Gastrointestinal:  Positive for diarrhea, nausea and vomiting.    Physical Exam Updated Vital Signs BP (!) 137/98   Pulse (!) 107   Temp 98.2 F (36.8 C) (Oral)   Resp 17   Ht 6\' 1"  (1.854 m)   Wt 98.9 kg   SpO2 98%   BMI 28.76 kg/m  Physical Exam Vitals and nursing note reviewed. Exam conducted with a chaperone present.  Constitutional:      General: He is not in acute distress.    Appearance: He is well-developed. He is not diaphoretic.  HENT:     Head: Normocephalic and atraumatic.     Nose: Nose normal.  Eyes:     Conjunctiva/sclera: Conjunctivae normal.     Pupils: Pupils are equal, round, and reactive to light.  Cardiovascular:     Rate and Rhythm: Regular rhythm. Tachycardia present.     Pulses: Normal pulses.     Heart sounds: Normal heart sounds.  Pulmonary:     Effort: Pulmonary effort is normal. Tachypnea present.     Breath sounds: No wheezing or  rales.  Abdominal:     General: Bowel sounds are normal.     Palpations: Abdomen is soft.     Tenderness: There is no abdominal tenderness. There is no guarding or rebound.  Musculoskeletal:        General: Normal range of motion.     Cervical back: Normal range of motion and neck supple.  Skin:    General: Skin is warm and dry.     Capillary Refill: Capillary refill takes less than 2 seconds.  Neurological:     General: No focal deficit present.     Mental Status: He is alert and oriented to person, place, and time.     Deep Tendon Reflexes: Reflexes normal.  Psychiatric:        Mood and Affect: Mood normal.     ED Results / Procedures / Treatments   Labs (all labs ordered are listed, but only  abnormal results are displayed) Labs Reviewed  RESP PANEL BY RT-PCR (RSV, FLU A&B, COVID)  RVPGX2  CULTURE, BLOOD (ROUTINE X 2)  CULTURE, BLOOD (ROUTINE X 2)  LACTIC ACID, PLASMA  LACTIC ACID, PLASMA  COMPREHENSIVE METABOLIC PANEL  CBC WITH DIFFERENTIAL/PLATELET  PROTIME-INR  APTT  URINALYSIS, W/ REFLEX TO CULTURE (INFECTION SUSPECTED)    EKG EKG Interpretation  Date/Time:  Monday Sylvester Minton 08 2024 06:32:34 EDT Ventricular Rate:  102 PR Interval:  134 QRS Duration: 90 QT Interval:  350 QTC Calculation: 456 R Axis:   63 Text Interpretation: Sinus tachycardia Confirmed by Craig Ionescu ([id number]) on 09/04/2022 6:34:59 AM  Radiology DG Chest 2 View  Result Date: 09/04/2022 CLINICAL DATA:  57 year old male with coughing, weakness, vomiting. EXAM: CHEST - 2 VIEW COMPARISON:  Chest radiographs 06/10/2022 and earlier. FINDINGS: PA and lateral views at 0604 hours. Chronic flattening of the diaphragms suggesting pulmonary hyperinflation. Mediastinal contours remain normal. Visualized tracheal air column is within normal limits. No pneumothorax, pulmonary edema, pleural effusion or confluent pulmonary opacity. No acute osseous abnormality identified. Paucity of bowel gas in the upper abdomen. IMPRESSION: No acute cardiopulmonary abnormality. Suspicion of chronic pulmonary hyperinflation. Electronically Signed   By: Odessa Fleming M.D.   On: 09/04/2022 06:13    Procedures Procedures    Medications Ordered in ED Medications  lactated ringers infusion (has no administration in time range)  aztreonam (AZACTAM) 2 g in sodium chloride 0.9 % 100 mL IVPB (2 g Intravenous New Bag/Given 09/04/22 0630)  metroNIDAZOLE (FLAGYL) IVPB 500 mg (500 mg Intravenous New Bag/Given 09/04/22 0619)  vancomycin (VANCOCIN) IVPB 1000 mg/200 mL premix (1,000 mg Intravenous New Bag/Given 09/04/22 0612)  lactated ringers bolus 1,000 mL (1,000 mLs Intravenous New Bag/Given 09/04/22 0610)    And  lactated ringers bolus 1,000 mL (1,000 mLs  Intravenous New Bag/Given 09/04/22 2979)    And  lactated ringers bolus 1,000 mL (has no administration in time range)  ondansetron (ZOFRAN-ODT) disintegrating tablet 8 mg (8 mg Oral Given 09/04/22 8921)  benzonatate (TESSALON) capsule 200 mg (200 mg Oral Given 09/04/22 1941)  ipratropium-albuterol (DUONEB) 0.5-2.5 (3) MG/3ML nebulizer solution 3 mL (3 mLs Nebulization Given 09/04/22 7408)    ED Course/ Medical Decision Making/ A&P                             Medical Decision Making Patient with cough productive of sputum, vomiting and diarrhea.    Amount and/or Complexity of Data Reviewed External Data Reviewed: notes.    Details: Previous notes reviewed  Labs: ordered.    Details: Sepsis labs ordered, covid and flu ordered and pending at this time  Radiology: ordered and independent interpretation performed.    Details: No pneumothorax by me on CXR ECG/medicine tests: ordered and independent interpretation performed. Decision-making details documented in ED Course.  Risk Prescription drug management. Decision regarding hospitalization. Risk Details: Patient met sepsis criteria upon arrival and sepsis protocol initiated.  Given a breathing treatment without change in hypoxia or breathing mechanics on exam.  CTA ordered to exclude PE. IV fluids initiated as well as antibiotics.  Patient updated on plan of care at 650 am   Critical Care Total time providing critical care: 30 minutes (Reassessment, potential for serious outcomes, sepsis bundle.  )   CRITICAL CARE Performed by: Kristapher Dubu[place] K Marshun Duva-Rasch Total critical care time: 30 minutes Critical care time was exclusive of separately billable procedures and treating other patients. Critical care was necessary to treat or prevent imminent or life-threatening deterioration. Critical care was time spent personally by me on the following activities: development of treatment plan with patient and/or surrogate as well as nursing, discussions with  consultants, evaluation of patient's response to treatment, examination of patient, obtaining history from patient or surrogate, ordering and performing treatments and interventions, ordering and review of laboratory studies, ordering and review of radiographic studies, pulse oximetry and re-evaluation of patient's condition.  Final Clinical Impression(s) / ED Diagnoses Final diagnoses:  Nausea vomiting and diarrhea  Acute cough  Sepsis, due to unspecified organism, unspecified whether acute organ dysfunction present   Signed out to Dr. Rhunette Croft pending labs and CTA, will need admission     Arbell Wycoff, MD 09/04/22 0454

## 2022-09-04 NOTE — Progress Notes (Signed)
Pharmacy Antibiotic Note  Samuela Deringer is a 57 y.o. male admitted on 09/04/2022 with sepsis.  Pharmacy has been consulted for vancomycin and aztreonam dosing.  Of note,  pt with listed allergy to PCN indicating swelling and no use of PCN or cephalosporins in the system   Pt also admitted with AKI as SCr this AM is 3.45 mg/dl.   Plan: Vancomycin 2000 mg IV x 1 given ED With pending being in AKI, will dose off levels Obtain random vancomycin level with AM labs  Aztreonam 2 gr IV  q12h   Height: 6\' 1"  (185.4 cm) Weight: 98.9 kg (218 lb) IBW/kg (Calculated) : 79.9  Temp (24hrs), Avg:98.4 F (36.9 C), Min:98.2 F (36.8 C), Max:98.5 F (36.9 C)  Recent Labs  Lab 09/04/22 0609 09/04/22 0830  WBC 42.5*  --   CREATININE 3.45*  --   LATICACIDVEN 1.8 1.7    Estimated Creatinine Clearance: 29.2 mL/min (A) (by C-G formula based on SCr of 3.45 mg/dL (H)).    Allergies  Allergen Reactions   Penicillins Swelling    Antimicrobials this admission: 4/8 aztreronam >>  4/8 vancomycin >>   Dose adjustments this admission:   Microbiology results: 4/8 BCx:  4/8 UCx:   4/8 resp panel: negative    Thank you for allowing pharmacy to be a part of this patient's care.  Adalberto Cole, PharmD, BCPS 09/04/2022 9:23 AM

## 2022-09-04 NOTE — Progress Notes (Signed)
  Transition of Care [facility]) Screening Note   Patient Details  Name: David [name] Date of Birth: 07-28-1965   Transition of Care [facility]) CM/SW Contact:    Lavenia Atlas, RN Phone Number: 09/04/2022, 3:51 PM    Transition of Care Department [facility]) has reviewed patient and no TOC needs have been identified at this time. We will continue to monitor patient advancement through interdisciplinary progression rounds. If new patient transition needs arise, please place a TOC consult.

## 2022-09-04 NOTE — ED Notes (Signed)
Patient transported to CT 

## 2022-09-04 NOTE — ED Notes (Signed)
ED TO INPATIENT HANDOFF REPORT  Name/Age/Gender David [name] 57 y.o. male  Code Status    Code Status Orders  (From admission, onward)           Start     Ordered   09/04/22 0802  Full code  Continuous       Question:  By:  Answer:  Consent: discussion documented in EHR   09/04/22 0802           Code Status History     Date Active Date Inactive Code Status Order ID Comments User Context   03/18/2021 0226 03/20/2021 1616 Full Code [id number]  Angie Fava, DO ED   05/22/2020 0314 05/22/2020 1812 Full Code [id number]  Andris Baumann, MD ED       Home/SNF/Other Home  Chief Complaint Sepsis due to undetermined organism [A41.9]  Level of Care/Admitting Diagnosis ED Disposition     ED Disposition  Admit   Condition  --   Comment  Hospital Area: [facility] [[id number]]  Level of Care: Stepdown [14]  Admit to SDU based on following criteria: Respiratory Distress:  Frequent assessment and/or intervention to maintain adequate ventilation/respiration, pulmonary toilet, and respiratory treatment.  May admit patient to [facility] or [facility] if equivalent level of care is available:: No  Covid Evaluation: Asymptomatic - no recent exposure (last 10 days) testing not required  Diagnosis: Sepsis due to undetermined organism [[id number]]  Admitting Physician: Bobette Mo [[id number]]  Attending Physician: Bobette Mo [[id number]]  Certification:: I certify this patient will need inpatient services for at least 2 midnights  Estimated Length of Stay: 2          Medical History Past Medical History:  Diagnosis Date   Asthma    HIV (human immunodeficiency virus infection)     Allergies Allergies  Allergen Reactions   Penicillins Swelling    IV Location/Drains/Wounds Patient Lines/Drains/Airways Status     Active Line/Drains/Airways     Name Placement date Placement time Site Days   Peripheral IV 09/04/22 20 G Left  Antecubital 09/04/22  0555  Antecubital  less than 1   Peripheral IV 09/04/22 20 G Right Antecubital 09/04/22  0555  Antecubital  less than 1            Labs/Imaging Results for orders placed or performed during the hospital encounter of 09/04/22 (from the past 48 hour(s))  Resp panel by RT-PCR (RSV, Flu A&B, Covid) Anterior Nasal Swab     Status: None   Collection Time: 09/04/22  6:09 AM   Specimen: Anterior Nasal Swab  Result Value Ref Range   SARS Coronavirus 2 by RT PCR NEGATIVE NEGATIVE    Comment: (NOTE) SARS-CoV-2 target nucleic acids are NOT DETECTED.  The SARS-CoV-2 RNA is generally detectable in upper respiratory specimens during the acute phase of infection. The lowest concentration of SARS-CoV-2 viral copies this assay can detect is 138 copies/mL. A negative result does not preclude SARS-Cov-2 infection and should not be used as the sole basis for treatment or other patient management decisions. A negative result may occur with  improper specimen collection/handling, submission of specimen other than nasopharyngeal swab, presence of viral mutation(s) within the areas targeted by this assay, and inadequate number of viral copies(<138 copies/mL). A negative result must be combined with clinical observations, patient history, and epidemiological information. The expected result is Negative.  Fact Sheet for Patients:  BloggerCourse.com  Fact Sheet for Healthcare Providers:  SeriousBroker.it  This  test is no t yet approved or cleared by the [place] and  has been authorized for detection and/or diagnosis of SARS-CoV-2 by FDA under an Emergency Use Authorization (EUA). This EUA will remain  in effect (meaning this test can be used) for the duration of the COVID-19 declaration under Section 564(b)(1) of the Act, 21 U.S.C.section 360bbb-3(b)(1), unless the authorization is terminated  or revoked sooner.        Influenza A by PCR NEGATIVE NEGATIVE   Influenza B by PCR NEGATIVE NEGATIVE    Comment: (NOTE) The Xpert Xpress SARS-CoV-2/FLU/RSV plus assay is intended as an aid in the diagnosis of influenza from Nasopharyngeal swab specimens and should not be used as a sole basis for treatment. Nasal washings and aspirates are unacceptable for Xpert Xpress SARS-CoV-2/FLU/RSV testing.  Fact Sheet for Patients: BloggerCourse.com  Fact Sheet for Healthcare Providers: SeriousBroker.it  This test is not yet approved or cleared by the [place] FDA and has been authorized for detection and/or diagnosis of SARS-CoV-2 by FDA under an Emergency Use Authorization (EUA). This EUA will remain in effect (meaning this test can be used) for the duration of the COVID-19 declaration under Section 564(b)(1) of the Act, 21 U.S.C. section 360bbb-3(b)(1), unless the authorization is terminated or revoked.     Resp Syncytial Virus by PCR NEGATIVE NEGATIVE    Comment: (NOTE) Fact Sheet for Patients: BloggerCourse.com  Fact Sheet for Healthcare Providers: SeriousBroker.it  This test is not yet approved or cleared by the [place] FDA and has been authorized for detection and/or diagnosis of SARS-CoV-2 by FDA under an Emergency Use Authorization (EUA). This EUA will remain in effect (meaning this test can be used) for the duration of the COVID-19 declaration under Section 564(b)(1) of the Act, 21 U.S.C. section 360bbb-3(b)(1), unless the authorization is terminated or revoked.  Performed at [facility], [address] [place], [place] [address]   Lactic acid, plasma     Status: None   Collection Time: 09/04/22  6:09 AM  Result Value Ref Range   Lactic Acid, Venous 1.8 0.5 - 1.9 mmol/L    Comment: Performed at [facility], [address] [place], [place]  [address]  Comprehensive metabolic panel     Status: Abnormal   Collection Time: 09/04/22  6:09 AM  Result Value Ref Range   Sodium 136 135 - 145 mmol/L   Potassium 3.6 3.5 - 5.1 mmol/L   Chloride 102 98 - 111 mmol/L   CO2 21 (L) 22 - 32 mmol/L   Glucose, Bld 131 (H) 70 - 99 mg/dL    Comment: Glucose reference range applies only to samples taken after fasting for at least 8 hours.   BUN 67 (H) 6 - 20 mg/dL   Creatinine, Ser 0.98 (H) 0.61 - 1.24 mg/dL   Calcium 8.1 (L) 8.9 - 10.3 mg/dL   Total Protein 8.0 6.5 - 8.1 g/dL   Albumin 3.5 3.5 - 5.0 g/dL   AST 34 15 - 41 U/L   ALT 36 0 - 44 U/L   Alkaline Phosphatase 101 38 - 126 U/L   Total Bilirubin 5.2 (H) 0.3 - 1.2 mg/dL   GFR, Estimated 20 (L) >60 mL/min    Comment: (NOTE) Calculated using the CKD-EPI Creatinine Equation (2021)    Anion gap 13 5 - 15    Comment: Performed at [facility], [address] [place], [place] [address]  CBC with Differential  Status: Abnormal   Collection Time: 09/04/22  6:09 AM  Result Value Ref Range   WBC 42.5 (H) 4.0 - 10.5 K/uL   RBC 3.67 (L) 4.22 - 5.81 MIL/uL   Hemoglobin 12.5 (L) 13.0 - 17.0 g/dL   HCT 16.1 (L) 09.6 - 04.5 %   MCV 102.7 (H) 80.0 - 100.0 fL   MCH 34.1 (H) 26.0 - 34.0 pg   MCHC 33.2 30.0 - 36.0 g/dL   RDW 40.9 81.1 - 91.4 %   Platelets 130 (L) 150 - 400 K/uL   nRBC 0.0 0.0 - 0.2 %   Neutrophils Relative % 92 %   Neutro Abs 39.3 (H) 1.7 - 7.7 K/uL   Lymphocytes Relative 4 %   Lymphs Abs 1.6 0.7 - 4.0 K/uL   Monocytes Relative 3 %   Monocytes Absolute 1.3 (H) 0.1 - 1.0 K/uL   Eosinophils Relative 0 %   Eosinophils Absolute 0.0 0.0 - 0.5 K/uL   Basophils Relative 0 %   Basophils Absolute 0.1 0.0 - 0.1 K/uL   WBC Morphology DOHLE BODIES    Immature Granulocytes 1 %   Abs Immature Granulocytes 0.36 (H) 0.00 - 0.07 K/uL   Reactive, Benign Lymphocytes PRESENT     Comment: Performed at [facility], [address], [place], [place]  [address]  Protime-INR     Status: Abnormal   Collection Time: 09/04/22  6:09 AM  Result Value Ref Range   Prothrombin Time 16.2 (H) 11.4 - 15.2 seconds   INR 1.3 (H) 0.8 - 1.2    Comment: (NOTE) INR goal varies based on device and disease states. Performed at [facility], [address], [place], [place] [address]   APTT     Status: Abnormal   Collection Time: 09/04/22  6:09 AM  Result Value Ref Range   aPTT 38 (H) 24 - 36 seconds    Comment:        IF BASELINE aPTT IS ELEVATED, SUGGEST PATIENT RISK ASSESSMENT BE USED TO DETERMINE APPROPRIATE ANTICOAGULANT THERAPY. Performed at [facility], [address] [place], [place] [address]   Urinalysis, w/ Reflex to Culture (Infection Suspected) -Urine, Clean Catch     Status: Abnormal   Collection Time: 09/04/22  6:09 AM  Result Value Ref Range   Specimen Source URINE, CLEAN CATCH    Color, Urine AMBER (A) YELLOW    Comment: BIOCHEMICALS MAY BE AFFECTED BY COLOR   APPearance HAZY (A) CLEAR   Specific Gravity, Urine 1.023 1.005 - 1.030   pH 5.0 5.0 - 8.0   Glucose, UA NEGATIVE NEGATIVE mg/dL   Hgb urine dipstick MODERATE (A) NEGATIVE   Bilirubin Urine NEGATIVE NEGATIVE   Ketones, ur NEGATIVE NEGATIVE mg/dL   Protein, ur 469 (A) NEGATIVE mg/dL   Nitrite NEGATIVE NEGATIVE   Leukocytes,Ua SMALL (A) NEGATIVE   RBC / HPF 0-5 0 - 5 RBC/hpf   WBC, UA >50 0 - 5 WBC/hpf    Comment:        Reflex urine culture not performed if WBC <=10, OR if Squamous epithelial cells >5. If Squamous epithelial cells >5 suggest recollection.    Bacteria, UA FEW (A) NONE SEEN   Squamous Epithelial / HPF 0-5 0 - 5 /HPF   Mucus PRESENT    Hyaline Casts, UA PRESENT     Comment: Performed at [facility], [address], [place], [place] [address]   CT Chest Wo Contrast  Result Date:  09/04/2022 CLINICAL DATA:  SOB EXAM: CT CHEST WITHOUT CONTRAST TECHNIQUE: Multidetector CT imaging of the chest was  performed following the standard protocol without IV contrast. RADIATION DOSE REDUCTION: This exam was performed according to the departmental dose-optimization program which includes automated exposure control, adjustment of the mA and/or kV according to patient size and/or use of iterative reconstruction technique. COMPARISON:  None Available. FINDINGS: Cardiovascular: No significant vascular findings. Normal heart size. No pericardial effusion. Mediastinum/Nodes: No enlarged mediastinal or axillary lymph nodes. Thyroid gland, trachea, and esophagus demonstrate no significant findings. Lungs/Pleura: Extensive diffuse bilateral pulmonary parenchymal scarring consistent with panacinar emphysema. Minimal bibasilar subsegmental atelectasis. No pleural or pericardial effusion. Upper Abdomen: Innumerable hepatic lesions consistent with cysts. No adrenal lesions identified. Musculoskeletal: No chest wall mass or suspicious bone lesions identified. IMPRESSION: Extensive diffuse panacinar emphysematous changes of the lungs and innumerable cysts in the liver. These findings suggest alpha-1 antitrypsin deficiency. Electronically Signed   By: Layla Maw M.D.   On: 09/04/2022 08:31   DG Chest 2 View  Result Date: 09/04/2022 CLINICAL DATA:  57 year old male with coughing, weakness, vomiting. EXAM: CHEST - 2 VIEW COMPARISON:  Chest radiographs 06/10/2022 and earlier. FINDINGS: PA and lateral views at 0604 hours. Chronic flattening of the diaphragms suggesting pulmonary hyperinflation. Mediastinal contours remain normal. Visualized tracheal air column is within normal limits. No pneumothorax, pulmonary edema, pleural effusion or confluent pulmonary opacity. No acute osseous abnormality identified. Paucity of bowel gas in the upper abdomen. IMPRESSION: No acute cardiopulmonary abnormality. Suspicion of chronic pulmonary hyperinflation. Electronically Signed   By: Odessa Fleming M.D.   On: 09/04/2022 06:13    Pending  Labs Unresulted Labs (From admission, onward)     Start     Ordered   09/05/22 0500  CBC with Differential  Daily,   R      09/04/22 0758   09/05/22 0500  Protime-INR  Tomorrow morning,   R        09/04/22 0758   09/05/22 0500  APTT  Tomorrow morning,   R        09/04/22 0758   09/04/22 0835  Lactate dehydrogenase  Once,   R        09/04/22 0835   09/04/22 0816  Reticulocytes  Once,   R        09/04/22 0815   09/04/22 0754  D-dimer, quantitative  Add-on,   AD        09/04/22 0753   09/04/22 0754  Fibrinogen  Add-on,   AD        09/04/22 0753   09/04/22 0753  Bilirubin, direct  Add-on,   AD        09/04/22 0752   09/04/22 0037  Urine Culture  Once,   R        09/04/22 0609   09/04/22 0555  Lactic acid, plasma  (Septic presentation on arrival (screening labs, nursing and treatment orders for obvious sepsis))  STAT Now then every 2 hours,   R (with STAT occurrences)      09/04/22 0556   09/04/22 0555  Blood Culture (routine x 2)  (Septic presentation on arrival (screening labs, nursing and treatment orders for obvious sepsis))  BLOOD CULTURE X 2,   STAT      09/04/22 0556            Vitals/Pain Today's Vitals   09/04/22 0547 09/04/22 0549 09/04/22 0558 09/04/22 0632  BP:    (!) 137/98  Pulse:   Marland Kitchen)  106 (!) 107  Resp:   17   Temp:      TempSrc:      SpO2:   93% 98%  Weight:  98.9 kg    Height:  6\' 1"  (1.854 m)    PainSc: 0-No pain       Isolation Precautions No active isolations  Medications Medications  lactated ringers infusion ( Intravenous New Bag/Given 09/04/22 0703)  lactated ringers bolus 1,000 mL (1,000 mLs Intravenous New Bag/Given 09/04/22 0610)    And  lactated ringers bolus 1,000 mL (1,000 mLs Intravenous New Bag/Given 09/04/22 0611)    And  lactated ringers bolus 1,000 mL (has no administration in time range)  metroNIDAZOLE (FLAGYL) IVPB 500 mg (has no administration in time range)  acetaminophen (TYLENOL) tablet 650 mg (has no administration in time  range)    Or  acetaminophen (TYLENOL) suppository 650 mg (has no administration in time range)  ondansetron (ZOFRAN) tablet 4 mg (has no administration in time range)    Or  ondansetron (ZOFRAN) injection 4 mg (has no administration in time range)  ondansetron (ZOFRAN-ODT) disintegrating tablet 8 mg (8 mg Oral Given 09/04/22 0613)  benzonatate (TESSALON) capsule 200 mg (200 mg Oral Given 09/04/22 0613)  aztreonam (AZACTAM) 2 g in sodium chloride 0.9 % 100 mL IVPB (0 g Intravenous Stopped 09/04/22 0706)  metroNIDAZOLE (FLAGYL) IVPB 500 mg (500 mg Intravenous New Bag/Given 09/04/22 0619)  vancomycin (VANCOCIN) IVPB 1000 mg/200 mL premix (1,000 mg Intravenous New Bag/Given 09/04/22 0612)  ipratropium-albuterol (DUONEB) 0.5-2.5 (3) MG/3ML nebulizer solution 3 mL (3 mLs Nebulization Given 09/04/22 [id number])    Mobility walks

## 2022-09-05 ENCOUNTER — Inpatient Hospital Stay (HOSPITAL_COMMUNITY): Payer: BC Managed Care – PPO

## 2022-09-05 DIAGNOSIS — R0902 Hypoxemia: Secondary | ICD-10-CM

## 2022-09-05 DIAGNOSIS — N179 Acute kidney failure, unspecified: Secondary | ICD-10-CM

## 2022-09-05 DIAGNOSIS — R112 Nausea with vomiting, unspecified: Secondary | ICD-10-CM

## 2022-09-05 DIAGNOSIS — A419 Sepsis, unspecified organism: Secondary | ICD-10-CM | POA: Diagnosis not present

## 2022-09-05 DIAGNOSIS — R609 Edema, unspecified: Secondary | ICD-10-CM

## 2022-09-05 DIAGNOSIS — R7989 Other specified abnormal findings of blood chemistry: Secondary | ICD-10-CM

## 2022-09-05 DIAGNOSIS — R051 Acute cough: Secondary | ICD-10-CM | POA: Diagnosis not present

## 2022-09-05 DIAGNOSIS — R197 Diarrhea, unspecified: Secondary | ICD-10-CM

## 2022-09-05 LAB — PROTIME-INR
INR: 1.2 (ref 0.8–1.2)
Prothrombin Time: 15.1 seconds (ref 11.4–15.2)

## 2022-09-05 LAB — COMPREHENSIVE METABOLIC PANEL
ALT: 27 U/L (ref 0–44)
AST: 31 U/L (ref 15–41)
Albumin: 2.9 g/dL — ABNORMAL LOW (ref 3.5–5.0)
Alkaline Phosphatase: 88 U/L (ref 38–126)
Anion gap: 8 (ref 5–15)
BUN: 30 mg/dL — ABNORMAL HIGH (ref 6–20)
CO2: 22 mmol/L (ref 22–32)
Calcium: 7.7 mg/dL — ABNORMAL LOW (ref 8.9–10.3)
Chloride: 104 mmol/L (ref 98–111)
Creatinine, Ser: 1.61 mg/dL — ABNORMAL HIGH (ref 0.61–1.24)
GFR, Estimated: 50 mL/min — ABNORMAL LOW (ref >60)
Glucose, Bld: 89 mg/dL (ref 70–99)
Potassium: 3.5 mmol/L (ref 3.5–5.1)
Sodium: 134 mmol/L — ABNORMAL LOW (ref 135–145)
Total Bilirubin: 2.6 mg/dL — ABNORMAL HIGH (ref 0.3–1.2)
Total Protein: 6.7 g/dL (ref 6.5–8.1)

## 2022-09-05 LAB — CBC WITH DIFFERENTIAL/PLATELET
Abs Immature Granulocytes: 0.09 10*3/uL — ABNORMAL HIGH (ref 0.00–0.07)
Basophils Absolute: 0.1 10*3/uL (ref 0.0–0.1)
Basophils Relative: 1 %
Eosinophils Absolute: 0.2 10*3/uL (ref 0.0–0.5)
Eosinophils Relative: 1 %
HCT: 31.3 % — ABNORMAL LOW (ref 39.0–52.0)
Hemoglobin: 10.2 g/dL — ABNORMAL LOW (ref 13.0–17.0)
Immature Granulocytes: 0 %
Lymphocytes Relative: 9 %
Lymphs Abs: 2 10*3/uL (ref 0.7–4.0)
MCH: 33.9 pg (ref 26.0–34.0)
MCHC: 32.6 g/dL (ref 30.0–36.0)
MCV: 104 fL — ABNORMAL HIGH (ref 80.0–100.0)
Monocytes Absolute: 1.1 10*3/uL — ABNORMAL HIGH (ref 0.1–1.0)
Monocytes Relative: 5 %
Neutro Abs: 19.6 10*3/uL — ABNORMAL HIGH (ref 1.7–7.7)
Neutrophils Relative %: 84 %
Platelets: 105 10*3/uL — ABNORMAL LOW (ref 150–400)
RBC: 3.01 MIL/uL — ABNORMAL LOW (ref 4.22–5.81)
RDW: 12.7 % (ref 11.5–15.5)
WBC: 23 10*3/uL — ABNORMAL HIGH (ref 4.0–10.5)
nRBC: 0.1 % (ref 0.0–0.2)

## 2022-09-05 LAB — IRON AND TIBC
Iron: 59 ug/dL (ref 45–182)
Saturation Ratios: 30 % (ref 17.9–39.5)
TIBC: 200 ug/dL — ABNORMAL LOW (ref 250–450)
UIBC: 141 ug/dL

## 2022-09-05 LAB — CULTURE, RESPIRATORY W GRAM STAIN

## 2022-09-05 LAB — RETICULOCYTES
Immature Retic Fract: 15.5 % (ref 2.3–15.9)
RBC.: 3.19 MIL/uL — ABNORMAL LOW (ref 4.22–5.81)
Retic Count, Absolute: 85.5 10*3/uL (ref 19.0–186.0)
Retic Ct Pct: 2.7 % (ref 0.4–3.1)

## 2022-09-05 LAB — APTT: aPTT: 34 seconds (ref 24–36)

## 2022-09-05 LAB — URINE CULTURE

## 2022-09-05 LAB — PROCALCITONIN: Procalcitonin: 89.79 ng/mL

## 2022-09-05 LAB — EXPECTORATED SPUTUM ASSESSMENT W GRAM STAIN, RFLX TO RESP C

## 2022-09-05 LAB — CULTURE, BLOOD (ROUTINE X 2): Special Requests: ADEQUATE

## 2022-09-05 LAB — VITAMIN B12: Vitamin B-12: 974 pg/mL — ABNORMAL HIGH (ref 180–914)

## 2022-09-05 LAB — SEDIMENTATION RATE: Sed Rate: 118 mm/hr — ABNORMAL HIGH (ref 0–16)

## 2022-09-05 LAB — C-REACTIVE PROTEIN: CRP: 11.6 mg/dL — ABNORMAL HIGH (ref <1.0)

## 2022-09-05 LAB — FOLATE: Folate: 5.5 ng/mL — ABNORMAL LOW (ref >5.9)

## 2022-09-05 LAB — STREP PNEUMONIAE URINARY ANTIGEN: Strep Pneumo Urinary Antigen: NEGATIVE

## 2022-09-05 LAB — FERRITIN: Ferritin: 521 ng/mL — ABNORMAL HIGH (ref 24–336)

## 2022-09-05 LAB — VANCOMYCIN, RANDOM: Vancomycin Rm: <4 ug/mL

## 2022-09-05 MED ORDER — SODIUM CHLORIDE 0.9 % IV SOLN
2.0000 g | Freq: Three times a day (TID) | INTRAVENOUS | Status: DC
  Administered 2022-09-05 – 2022-09-07 (×7): 2 g via INTRAVENOUS
  Filled 2022-09-05 (×7): qty 12.5

## 2022-09-05 MED ORDER — IPRATROPIUM-ALBUTEROL 0.5-2.5 (3) MG/3ML IN SOLN
3.0000 mL | Freq: Three times a day (TID) | RESPIRATORY_TRACT | Status: DC
  Administered 2022-09-05 – 2022-09-07 (×6): 3 mL via RESPIRATORY_TRACT
  Filled 2022-09-05 (×6): qty 3

## 2022-09-05 MED ORDER — IPRATROPIUM-ALBUTEROL 0.5-2.5 (3) MG/3ML IN SOLN
3.0000 mL | Freq: Four times a day (QID) | RESPIRATORY_TRACT | Status: DC
  Administered 2022-09-05: 3 mL via RESPIRATORY_TRACT
  Filled 2022-09-05: qty 3

## 2022-09-05 MED ORDER — FOLIC ACID 1 MG PO TABS
1.0000 mg | ORAL_TABLET | Freq: Every day | ORAL | Status: DC
  Administered 2022-09-05 – 2022-09-07 (×3): 1 mg via ORAL
  Filled 2022-09-05 (×3): qty 1

## 2022-09-05 MED ORDER — PREDNISONE 20 MG PO TABS
40.0000 mg | ORAL_TABLET | Freq: Every day | ORAL | Status: DC
  Administered 2022-09-05 – 2022-09-07 (×3): 40 mg via ORAL
  Filled 2022-09-05 (×3): qty 2

## 2022-09-05 MED ORDER — METHYLPREDNISOLONE SODIUM SUCC 40 MG IJ SOLR: 40.0000 mg | Freq: Two times a day (BID) | INTRAMUSCULAR | Status: DC

## 2022-09-05 MED ORDER — MOMETASONE FURO-FORMOTEROL FUM 100-5 MCG/ACT IN AERO
2.0000 | INHALATION_SPRAY | Freq: Two times a day (BID) | RESPIRATORY_TRACT | Status: DC
  Administered 2022-09-05 – 2022-09-07 (×5): 2 via RESPIRATORY_TRACT
  Filled 2022-09-05: qty 8.8

## 2022-09-05 MED ORDER — ENSURE ENLIVE PO LIQD
237.0000 mL | Freq: Two times a day (BID) | ORAL | Status: DC
  Administered 2022-09-05: 237 mL via ORAL

## 2022-09-05 MED ORDER — MONTELUKAST SODIUM 10 MG PO TABS
10.0000 mg | ORAL_TABLET | Freq: Every day | ORAL | Status: DC
  Administered 2022-09-05 – 2022-09-06 (×2): 10 mg via ORAL
  Filled 2022-09-05 (×2): qty 1

## 2022-09-05 MED ORDER — BICTEGRAVIR-EMTRICITAB-TENOFOV 50-200-25 MG PO TABS
1.0000 | ORAL_TABLET | Freq: Every day | ORAL | Status: DC
  Administered 2022-09-05 – 2022-09-07 (×3): 1 via ORAL
  Filled 2022-09-05 (×3): qty 1

## 2022-09-05 MED ORDER — VANCOMYCIN HCL 2000 MG/400ML IV SOLN
2000.0000 mg | Freq: Once | INTRAVENOUS | Status: AC
  Administered 2022-09-05: 2000 mg via INTRAVENOUS
  Filled 2022-09-05: qty 400

## 2022-09-05 NOTE — Progress Notes (Signed)
BLE venous duplex has been completed.    Results can be found under chart review under CV PROC. 09/05/2022 4:53 PM Maylea Soria RVT, RDMS

## 2022-09-05 NOTE — Progress Notes (Signed)
Initial Nutrition Assessment  DOCUMENTATION CODES:   Not applicable  INTERVENTION:  - Soft diet per MD.  - Ensure Plus High Protein po BID, each supplement provides 350 kcal and 20 grams of protein. - Encourage intake as tolerated.    NUTRITION DIAGNOSIS:   Inadequate oral intake related to acute illness, poor appetite as evidenced by per patient/family report, energy intake < or equal to 50% for > or equal to 5 days.  GOAL:   Patient will meet greater than or equal to 90% of their needs  MONITOR:   PO intake, Supplement acceptance, Diet advancement  REASON FOR ASSESSMENT:   Malnutrition Screening Tool    ASSESSMENT:   57 y.o. male with PMH former 70-pack-year smoker (currently not smoking), HIV, HTN who presented with complaints of diarrhea, emesis, mild abdominal discomfort, subjective fevers, and night sweats and chills. Admitted with sepsis.   Patient reports UBW of 218# and denied any recent changes in weight. Per EMR weight stable the past year.   Pt endorses eating around 1 large meal a day at home. Usually has coffee for breakfast, a snack such as fruit for lunch, and a dinner meal. Eating well until the past week when he started feeling poorly. Appetite has been decreased since that time.   Current appetite still decreased and patient reports he is afraid to eat some foods. Advanced to a soft diet this AM. Ordered patient his desired meal of tilapia, rice with gravy, and apple juice.   Encouraged patient to aim to order three meals a day and work on slowly increasing intake as tolerated. Agreeable to receive Ensure to support intake during admission.     Medications reviewed and include: Flagyl  Labs reviewed:  Na 134 Creatinine 1.61   NUTRITION - FOCUSED PHYSICAL EXAM:  Flowsheet Row Most Recent Value  Orbital Region No depletion  Upper Arm Region No depletion  Thoracic and Lumbar Region No depletion  Buccal Region No depletion  Temple Region Mild  depletion  Clavicle Bone Region No depletion  Clavicle and Acromion Bone Region No depletion  Scapular Bone Region Unable to assess  Dorsal Hand No depletion  Patellar Region No depletion  Anterior Thigh Region No depletion  Posterior Calf Region Mild depletion  Edema (RD Assessment) None  Hair Reviewed  Eyes Reviewed  Mouth Reviewed  Skin Reviewed  Nails Reviewed       Diet Order:   Diet Order             DIET SOFT Room service appropriate? Yes; Fluid consistency: Thin  Diet effective now                   EDUCATION NEEDS:  Education needs have been addressed  Skin:  Skin Assessment: Reviewed RN Assessment  Last BM:  4/8  Height:  Ht Readings from Last 1 Encounters:  09/04/22 6\' 1"  (1.854 m)   Weight:  Wt Readings from Last 1 Encounters:  09/04/22 98.9 kg    BMI:  Body mass index is 28.77 kg/m.  Estimated Nutritional Needs:  Kcal:  2250-2450 kcals Protein:  100-120 grams Fluid:  >/= 2.2L    Shelle Iron RD, LDN For contact information, refer to [facility].

## 2022-09-05 NOTE — Progress Notes (Signed)
[facility]                                                                              David [name], is a 57 y.o. male, DOB - 20-May-1966, ZOX:[id number] Admit date - 09/04/2022    Outpatient Primary MD for the patient is No primary care provider on file.  LOS - 1  days  Chief Complaint  Patient presents with   Shortness of Breath   Emesis       Brief summary   Patient is a 57 year old male with asthma, former 70-pack-year smoker, currently not smoking, history of syphilis, HIV, HTN presented to ED with complaints of 5-6 episodes of diarrhea that started over the weekend, 1 episode of emesis, mild abdominal discomfort, subjective fevers, and night sweats and chills on Saturday evening 2 days prior to admission.  Also reported dyspnea, fatigue, productive cough with clear sputum.  No sick contacts or travel history.  Denied any chest pain, palpitations, orthopnea or PND.  In ED, temp 98.2 F, pulse 111, RR 19-27, BP 140/93.  O2 sats 91% on room air, subsequently placed on 2 L O2 via [place] Patient received IV fluid bolus and was placed on IV antibiotics, vancomycin, metronidazole and aztreonam  CBC showed WBCs 42.5, hemoglobin 12.5.  COVID, flu, RSV negative. Creatinine 3.45, D-dimer 10.62 INR 1.3.  Baseline creatinine 1.46 on 06/10/2022 CT chest showed extensive diffuse pan acinar emphysematous changes of the lungs and innumerable cysts in the liver, findings suggestive alpha-1 antitrypsin deficiency.   Assessment & Plan    Principal Problem:   Sepsis due to undetermined organism, POA -Presented with tachycardia, tachypnea, leukocytosis, mild hypoxia, leukocytosis, acute kidney injury, elevated procalcitonin 89.79. -Started on broad-spectrum antibiotics, follow blood cultures, sputum cultures, urine Legionella antigen, urine strep antigen -Follow alpha 1 antitrypsin -For now continue broad-spectrum antibiotics, IV fluids   Active Problems: Severe emphysema,  possibly due to AAT deficiency, smoking history, COPD exacerbation,  acute respiratory failure with hypoxia Elevated D-dimer -Currently bilateral wheezing, placed on scheduled nebs, Dulera, Singulair 10 mg daily -Placed on prednisone 40 mg daily for 5 days, resume maintenance dose of 10 mg daily (for dermatological issue )after completing 5 days -Continue O2, wean off as tolerated -D-dimer elevated, VQ scan negative for PE, follow venous Dopplers LE -Outpatient follow-up with pulmonology   Gastroenteritis, in numerous liver cysts/cirrhosis on CT, hyperbilirubinemia -RUQ US showed gallbladder sludge with nonspecific gallbladder wall thickening, no acute cholecystitis.  Cirrhotic morphology of the liver with 3 indeterminate left hepatic lobe lesions, recommended MRI liver protocol -Continue IV fluids, follow cultures, AAT, CEA, AFP -GI following -Will obtain MRI liver once creatinine function has improved    HIV disease -Continue Biktarvy    AKI (acute kidney injury) -Likely due to #1, presented with creatinine of 3.45 -Continue IV fluid hydration, improving to 1.6 today   Chronic macrocytic anemia -MCV 104.0, obtain anemia profile, hemoglobin 10.2 -Baseline hemoglobin ~12  Obesity Estimated body mass index is 28.77 kg/m as calculated from the following:   Height as of this encounter:  (1.854 m).   Weight as of this encounter: 98.9 kg.  Code Status:  Full CODE STATUS DVT Prophylaxis:  Place and maintain sequential compression device Start: 09/04/22 1352   Level of Care: Level of care: Stepdown Family Communication: Updated patient Disposition Plan:      Remains inpatient appropriate:      Procedures:    Consultants:   Gastroenterology  Antimicrobials:   Anti-infectives (From admission, onward)    Start     Dose/Rate Route Frequency Ordered Stop   09/05/22 1000  bictegravir-emtricitabine-tenofovir AF (BIKTARVY) 50-200-25 MG per tablet 1 tablet        1 tablet  Oral Daily 09/05/22 0747     09/05/22 1000  ceFEPIme (MAXIPIME) 2 g in sodium chloride 0.9 % 100 mL IVPB        2 g 200 mL/hr over 30 Minutes Intravenous Every 8 hours 09/05/22 0915     09/05/22 0815  vancomycin (VANCOREADY) IVPB 2000 mg/400 mL        2,000 mg 200 mL/hr over 120 Minutes Intravenous  Once 09/05/22 0725 09/05/22 1024   09/04/22 2000  aztreonam (AZACTAM) 2 g in sodium chloride 0.9 % 100 mL IVPB  Status:  Discontinued        2 g 200 mL/hr over 30 Minutes Intravenous Every 12 hours 09/04/22 0924 09/05/22 0915   09/04/22 1800  metroNIDAZOLE (FLAGYL) IVPB 500 mg        500 mg 100 mL/hr over 60 Minutes Intravenous Every 12 hours 09/04/22 0758 09/11/22 1759   09/04/22 0924  vancomycin variable dose per unstable renal function (pharmacist dosing)  Status:  Discontinued         Does not apply See admin instructions 09/04/22 0924 09/05/22 0846   09/04/22 0600  aztreonam (AZACTAM) 2 g in sodium chloride 0.9 % 100 mL IVPB        2 g 200 mL/hr over 30 Minutes Intravenous  Once 09/04/22 0556 09/04/22 0706   09/04/22 0600  metroNIDAZOLE (FLAGYL) IVPB 500 mg        500 mg 100 mL/hr over 60 Minutes Intravenous  Once 09/04/22 0556 09/04/22 1859   09/04/22 0600  vancomycin (VANCOCIN) IVPB 1000 mg/200 mL premix        1,000 mg 200 mL/hr over 60 Minutes Intravenous  Once 09/04/22 0556 09/04/22 1859          Medications  amLODipine  5 mg Oral Daily   bictegravir-emtricitabine-tenofovir AF  1 tablet Oral Daily   Chlorhexidine Gluconate Cloth  6 each Topical Daily   feeding supplement  237 mL Oral BID BM   ipratropium-albuterol  3 mL Nebulization TID   mometasone-formoterol  2 puff Inhalation BID   montelukast  10 mg Oral QHS   predniSONE  40 mg Oral QAC breakfast      Subjective:   David [name] was seen and examined today.  Wheezing bilaterally, still has some shortness of breath, no acute vomiting or abdominal pain at this time.  Overall feeling somewhat better today.  No  fevers, no acute events overnight Objective:   Vitals:   09/05/22 0600 09/05/22 0700 09/05/22 0800 09/05/22 0900  BP: (!) 135/99 (!) 141/93 (!) 130/97   Pulse: 79 94 94   Resp: (!) 25 18 14    Temp:   98.1 F (36.7 C)   TempSrc:   Oral   SpO2: 90% 95% 97% 96%  Weight:      Height:        Intake/Output Summary (Last 24 hours) at 09/05/2022 1103 Last data filed at 09/05/2022 1026 Gross per  24 hour  Intake 2610.83 ml  Output 1577 ml  Net 1033.83 ml     Wt Readings from Last 3 Encounters:  09/04/22 98.9 kg  06/10/22 98.9 kg  12/19/21 98.4 kg     Exam General: Alert and oriented x 3, NAD Cardiovascular: S1 S2 auscultated,  RRR Respiratory: Bilateral wheezing Gastrointestinal: Soft, nontender, nondistended, + bowel sounds Ext: no pedal edema bilaterally Neuro: no new deficits Psych: Normal affect    Data Reviewed:  I have personally reviewed following labs    CBC Lab Results  Component Value Date   WBC 23.0 (H) 09/05/2022   RBC 3.01 (L) 09/05/2022   HGB 10.2 (L) 09/05/2022   HCT 31.3 (L) 09/05/2022   MCV 104.0 (H) 09/05/2022   MCH 33.9 09/05/2022   PLT 105 (L) 09/05/2022   MCHC 32.6 09/05/2022   RDW 12.7 09/05/2022   LYMPHSABS 2.0 09/05/2022   MONOABS 1.1 (H) 09/05/2022   EOSABS 0.2 09/05/2022   BASOSABS 0.1 09/05/2022     Last metabolic panel Lab Results  Component Value Date   NA 134 (L) 09/05/2022   K 3.5 09/05/2022   CL 104 09/05/2022   CO2 22 09/05/2022   BUN 30 (H) 09/05/2022   CREATININE 1.61 (H) 09/05/2022   GLUCOSE 89 09/05/2022   GFRNONAA 50 (L) 09/05/2022   GFRAA >60 12/09/2018   CALCIUM 7.7 (L) 09/05/2022   PROT 6.7 09/05/2022   ALBUMIN 2.9 (L) 09/05/2022   BILITOT 2.6 (H) 09/05/2022   ALKPHOS 88 09/05/2022   AST 31 09/05/2022   ALT 27 09/05/2022   ANIONGAP 8 09/05/2022    CBG (last 3)  No results for input(s): "GLUCAP" in the last 72 hours.    Coagulation Profile: Recent Labs  Lab 09/04/22 0609 09/05/22 0255  INR 1.3*  1.2     Radiology Studies: I have personally reviewed the imaging studies  NM Pulmonary Perfusion  Result Date: 09/04/2022 CLINICAL DATA:  PE EXAM: NUCLEAR MEDICINE PERFUSION LUNG SCAN TECHNIQUE: Perfusion images were obtained in multiple projections after intravenous injection of radiopharmaceutical. Ventilation scans intentionally deferred if perfusion scan and chest x-ray adequate for interpretation during COVID 19 epidemic. RADIOPHARMACEUTICALS:  4.4 mCi Tc-57m MAA IV COMPARISON:  None Available. FINDINGS: Perfusion images demonstrate no evidence of significant segmental or subsegmental perfusion defects to suggest the presence of PE. IMPRESSION: No perfusion defects identified to suggest PE. Electronically Signed   By: Layla Maw M.D.   On: 09/04/2022 13:42   US Abdomen Limited RUQ (LIVER/GB)  Result Date: 09/04/2022 CLINICAL DATA:  73059 Hyperbilirubinemia 64403.  HIV EXAM: ULTRASOUND ABDOMEN LIMITED RIGHT UPPER QUADRANT COMPARISON:  None Available. FINDINGS: Gallbladder: Gallbladder sludge within the gallbladder lumen. Gallbladder wall thickening. No pericholecystic fluid. No sonographic Murphy sign noted by sonographer. Common bile duct: Diameter: 7 mm. Liver: Total of 3 indeterminate hepatic lesions identified: 2.8 x 1.9 x 2.5 cm cystic lesion, 2.1 x 1.6 x 1.9 cm cystic lesion, 1.2 x 1.1 x 0.9 cm solid hyperechoic lesion. Nodular hepatic contour. Coarsened parenchymal echogenicity. Portal vein is patent on color Doppler imaging with normal direction of blood flow towards the liver. Other: None. IMPRESSION: 1. Gallbladder sludge with nonspecific gallbladder wall thickening which can be seen in the setting of chronic hepatic disease. No definite findings of acute cholecystitis. 2. Cirrhotic morphology of the liver with three indeterminate left hepatic lobe lesions, two cystic and one solid hyperechoic. Hepatocellular carcinoma cannot be excluded in the setting of cirrhosis. Recommend MRI liver  protocol for further  evaluation. When the patient is clinically stable and able to follow directions and hold their breath (preferably as an outpatient) further evaluation with dedicated abdominal MRI should be considered. Electronically Signed   By: Tish Frederickson M.D.   On: 09/04/2022 08:47   CT Chest Wo Contrast  Result Date: 09/04/2022 CLINICAL DATA:  SOB EXAM: CT CHEST WITHOUT CONTRAST TECHNIQUE: Multidetector CT imaging of the chest was performed following the standard protocol without IV contrast. RADIATION DOSE REDUCTION: This exam was performed according to the departmental dose-optimization program which includes automated exposure control, adjustment of the mA and/or kV according to patient size and/or use of iterative reconstruction technique. COMPARISON:  None Available. FINDINGS: Cardiovascular: No significant vascular findings. Normal heart size. No pericardial effusion. Mediastinum/Nodes: No enlarged mediastinal or axillary lymph nodes. Thyroid gland, trachea, and esophagus demonstrate no significant findings. Lungs/Pleura: Extensive diffuse bilateral pulmonary parenchymal scarring consistent with panacinar emphysema. Minimal bibasilar subsegmental atelectasis. No pleural or pericardial effusion. Upper Abdomen: Innumerable hepatic lesions consistent with cysts. No adrenal lesions identified. Musculoskeletal: No chest wall mass or suspicious bone lesions identified. IMPRESSION: Extensive diffuse panacinar emphysematous changes of the lungs and innumerable cysts in the liver. These findings suggest alpha-1 antitrypsin deficiency. Electronically Signed   By: Layla Maw M.D.   On: 09/04/2022 08:31   DG Chest 2 View  Result Date: 09/04/2022 CLINICAL DATA:  57 year old male with coughing, weakness, vomiting. EXAM: CHEST - 2 VIEW COMPARISON:  Chest radiographs 06/10/2022 and earlier. FINDINGS: PA and lateral views at 0604 hours. Chronic flattening of the diaphragms suggesting pulmonary  hyperinflation. Mediastinal contours remain normal. Visualized tracheal air column is within normal limits. No pneumothorax, pulmonary edema, pleural effusion or confluent pulmonary opacity. No acute osseous abnormality identified. Paucity of bowel gas in the upper abdomen. IMPRESSION: No acute cardiopulmonary abnormality. Suspicion of chronic pulmonary hyperinflation. Electronically Signed   By: Odessa Fleming M.D.   On: 09/04/2022 06:13       Lyriq Jarchow M.D. [facility] 09/05/2022, 11:03 AM  Available via Epic secure chat 7am-7pm After 7 pm, please refer to night coverage provider listed on amion.

## 2022-09-05 NOTE — Progress Notes (Signed)
Subjective: Improved nausea, but had some troubles with diet per nursing. Improved diarrhea.  Objective: Vital signs in last 24 hours: Temp:  [98.1 F (36.7 C)-98.9 F (37.2 C)] 98.1 F (36.7 C) (04/09 0800) Pulse Rate:  [79-109] 94 (04/09 0800) Resp:  [14-25] 14 (04/09 0800) BP: (118-146)/(75-101) 130/97 (04/09 0800) SpO2:  [90 %-97 %] 96 % (04/09 0900) Weight change: 0.016 kg Last BM Date : 09/04/22  PE: GEN:  NAD ABD:  Soft, mild generalized distended, non-tender, no peritonitis  Lab Results:  CBC    Component Value Date/Time   WBC 23.0 (H) 09/05/2022 0255   RBC 3.01 (L) 09/05/2022 0255   HGB 10.2 (L) 09/05/2022 0255   HCT 31.3 (L) 09/05/2022 0255   PLT 105 (L) 09/05/2022 0255   MCV 104.0 (H) 09/05/2022 0255   MCH 33.9 09/05/2022 0255   MCHC 32.6 09/05/2022 0255   RDW 12.7 09/05/2022 0255   LYMPHSABS 2.0 09/05/2022 0255   MONOABS 1.1 (H) 09/05/2022 0255   EOSABS 0.2 09/05/2022 0255   BASOSABS 0.1 09/05/2022 0255  CMP     Component Value Date/Time   NA 134 (L) 09/05/2022 0710   K 3.5 09/05/2022 0710   CL 104 09/05/2022 0710   CO2 22 09/05/2022 0710   GLUCOSE 89 09/05/2022 0710   BUN 30 (H) 09/05/2022 0710   CREATININE 1.61 (H) 09/05/2022 0710   CREATININE 1.16 12/19/2021 1502   CALCIUM 7.7 (L) 09/05/2022 0710   PROT 6.7 09/05/2022 0710   ALBUMIN 2.9 (L) 09/05/2022 0710   AST 31 09/05/2022 0710   ALT 27 09/05/2022 0710   ALKPHOS 88 09/05/2022 0710   BILITOT 2.6 (H) 09/05/2022 0710   GFRNONAA 50 (L) 09/05/2022 0710   GFRAA >60 12/09/2018 0841   Assessment:   Acute nausea, vomiting, diarrhea.  Suspect acute viral gastroenteritis.  Improving. HIV, on retroviral therapy, viral load undetectable last year and current lymphocyte count normal on CBC. Abnormal imaging:  Suspected emphysema and cirrhosis.  Liver lesions unclear etiology.  Plan:   Supportive care (antiemetics, IV fluids). Diet as tolerated. Awaiting tumor markers (CEA, AFP, Ca 19-9). Will  wait another day for MRI, ideally would like renal function a little more improved prior to getting contrast (went from 3.45 to 1.61 in 24 hours after hydration and supportive care). [facility] will follow.   Freddy Jaksch 09/05/2022, 11:09 AM   Cell [phone] If no answer or after 5 PM call [phone]

## 2022-09-06 ENCOUNTER — Inpatient Hospital Stay (HOSPITAL_COMMUNITY): Payer: BC Managed Care – PPO

## 2022-09-06 DIAGNOSIS — A419 Sepsis, unspecified organism: Secondary | ICD-10-CM | POA: Diagnosis not present

## 2022-09-06 LAB — CULTURE, BLOOD (ROUTINE X 2)

## 2022-09-06 LAB — C DIFFICILE QUICK SCREEN W PCR REFLEX
C Diff antigen: NEGATIVE
C Diff interpretation: NOT DETECTED
C Diff toxin: NEGATIVE

## 2022-09-06 LAB — COMPREHENSIVE METABOLIC PANEL
ALT: 27 U/L (ref 0–44)
AST: 26 U/L (ref 15–41)
Albumin: 2.9 g/dL — ABNORMAL LOW (ref 3.5–5.0)
Alkaline Phosphatase: 102 U/L (ref 38–126)
Anion gap: 8 (ref 5–15)
BUN: 22 mg/dL — ABNORMAL HIGH (ref 6–20)
CO2: 22 mmol/L (ref 22–32)
Calcium: 7.7 mg/dL — ABNORMAL LOW (ref 8.9–10.3)
Chloride: 104 mmol/L (ref 98–111)
Creatinine, Ser: 1.36 mg/dL — ABNORMAL HIGH (ref 0.61–1.24)
GFR, Estimated: >60 mL/min (ref >60)
Glucose, Bld: 116 mg/dL — ABNORMAL HIGH (ref 70–99)
Potassium: 3.5 mmol/L (ref 3.5–5.1)
Sodium: 134 mmol/L — ABNORMAL LOW (ref 135–145)
Total Bilirubin: 1.8 mg/dL — ABNORMAL HIGH (ref 0.3–1.2)
Total Protein: 6.7 g/dL (ref 6.5–8.1)

## 2022-09-06 LAB — CBC WITH DIFFERENTIAL/PLATELET
Abs Immature Granulocytes: 0.19 10*3/uL — ABNORMAL HIGH (ref 0.00–0.07)
Basophils Absolute: 0.1 10*3/uL (ref 0.0–0.1)
Basophils Relative: 0 %
Eosinophils Absolute: 0 10*3/uL (ref 0.0–0.5)
Eosinophils Relative: 0 %
HCT: 30.2 % — ABNORMAL LOW (ref 39.0–52.0)
Hemoglobin: 9.8 g/dL — ABNORMAL LOW (ref 13.0–17.0)
Immature Granulocytes: 1 %
Lymphocytes Relative: 13 %
Lymphs Abs: 2.2 10*3/uL (ref 0.7–4.0)
MCH: 33.7 pg (ref 26.0–34.0)
MCHC: 32.5 g/dL (ref 30.0–36.0)
MCV: 103.8 fL — ABNORMAL HIGH (ref 80.0–100.0)
Monocytes Absolute: 1.3 10*3/uL — ABNORMAL HIGH (ref 0.1–1.0)
Monocytes Relative: 8 %
Neutro Abs: 12.4 10*3/uL — ABNORMAL HIGH (ref 1.7–7.7)
Neutrophils Relative %: 78 %
Platelets: 130 10*3/uL — ABNORMAL LOW (ref 150–400)
RBC: 2.91 MIL/uL — ABNORMAL LOW (ref 4.22–5.81)
RDW: 12.5 % (ref 11.5–15.5)
WBC: 16.1 10*3/uL — ABNORMAL HIGH (ref 4.0–10.5)
nRBC: 0 % (ref 0.0–0.2)

## 2022-09-06 LAB — PROCALCITONIN: Procalcitonin: 43.44 ng/mL

## 2022-09-06 LAB — ALPHA-1-ANTITRYPSIN: A-1 Antitrypsin, Ser: 260 mg/dL — ABNORMAL HIGH (ref 101–187)

## 2022-09-06 MED ORDER — MELATONIN 3 MG PO TABS
3.0000 mg | ORAL_TABLET | Freq: Once | ORAL | Status: AC
  Administered 2022-09-06: 3 mg via ORAL
  Filled 2022-09-06: qty 1

## 2022-09-06 MED ORDER — TRAZODONE HCL 50 MG PO TABS
50.0000 mg | ORAL_TABLET | Freq: Every evening | ORAL | Status: AC | PRN
  Administered 2022-09-06: 50 mg via ORAL
  Filled 2022-09-06: qty 1

## 2022-09-06 MED ORDER — GADOBUTROL 1 MMOL/ML IV SOLN
10.0000 mL | Freq: Once | INTRAVENOUS | Status: AC | PRN
  Administered 2022-09-06: 10 mL via INTRAVENOUS

## 2022-09-06 NOTE — Progress Notes (Signed)
Tumor markers pending.  Creatinine improving.  MRI abdomen liver protocol ordered.  Next step in management for [place] pending MRI results.  [facility] will revisit tomorrow.

## 2022-09-06 NOTE — Progress Notes (Signed)
Mobility Specialist - Progress Note   09/06/22 1331  Mobility  Activity Ambulated with assistance in hallway  Level of Assistance Modified independent, requires aide device or extra time  Assistive Device None  Distance Ambulated (ft) 700 ft  Activity Response Tolerated well  Mobility Referral Yes  $Mobility charge 1 Mobility   Pt received in chair and agreed to mobility, had no c/o pain nor discomfort during session. Pt returned to chair with all needs met.  Marilynne Halsted Mobility Specialist

## 2022-09-06 NOTE — Progress Notes (Signed)
PROGRESS NOTE  David [name] ZOX:[id number] DOB: 10/23/1965 DOA: 09/04/2022 PCP: No primary care provider on file.   LOS: 2 days   Brief Narrative / Interim history: 57 year old male with asthma, former 70-pack-year smoker, currently not smoking, history of syphilis, HIV, HTN presented to ED with complaints of 5-6 episodes of diarrhea that started over the weekend, 1 episode of emesis, mild abdominal discomfort, subjective fevers, and night sweats and chills on Saturday evening 2 days prior to admission. Also reported dyspnea, fatigue, productive cough with clear sputum. No sick contacts or travel history. Denied any chest pain, palpitations, orthopnea or PND.   Subjective / 24h Interval events: He is overall feeling better, breathing is improved and no longer feeling short of breath.  Assesement and Plan: Principal Problem:   Sepsis due to undetermined organism Active Problems:   HIV disease   Hypertension   Emphysema due to alpha-1-antitrypsin deficiency   Hyperbilirubinemia   AKI (acute kidney injury)   Positive D dimer   Thrombocytopenia   Leukocytosis   Macrocytic anemia   Hypocalcemia   Liver lesion   Principal problem Principal Problem: Sepsis due to undetermined organism, POA -Presented with tachycardia, tachypnea, leukocytosis, mild hypoxia, leukocytosis, acute kidney injury, elevated procalcitonin 89.79. Started on broad-spectrum antibiotics, follow blood cultures -negative so far, sputum cultures, urine Legionella antigen -pending, urine strep antigen -negative -alpha 1 antitrypsin actually elevated, possibly in the setting of acute illness -For now continue broad-spectrum antibiotics, IV fluids     Active Problems: Severe emphysema, possibly due to AAT deficiency, smoking history, COPD exacerbation, acute respiratory failure with hypoxia Elevated D-dimer -continue steroids, nebulizers.  Wheezing has resolved.  Continue prednisone 40 mg for total of 5 days,  afterwards resume maintenance of 10 mg daily for his dermatological issues -Has been weaned off to room air this morning, encourage ambulation -D-dimer elevated, lower extremity Dopplers negative for DVT, VQ scan negative for PE.   Gastroenteritis, in numerous liver cysts/cirrhosis on CT, hyperbilirubinemia -RUQ US showed gallbladder sludge with nonspecific gallbladder wall thickening, no acute cholecystitis.  Cirrhotic morphology of the liver with 3 indeterminate left hepatic lobe lesions, recommended MRI liver protocol -GI following, obtain MRI of the liver   HIV disease -Continue Biktarvy.  Controlled, follows with ID as an outpatient   AKI (acute kidney injury) -Likely due to #1, presented with creatinine of 3.45.  Creatinine improving with fluids   Chronic macrocytic anemia, folate deficiency -MCV 104.0, B12 normal, folate low.  Start folic acid supplements.  Hemoglobin stable, there is a dilutional component  Scheduled Meds:  amLODipine  5 mg Oral Daily   bictegravir-emtricitabine-tenofovir AF  1 tablet Oral Daily   Chlorhexidine Gluconate Cloth  6 each Topical Daily   feeding supplement  237 mL Oral BID BM   folic acid  1 mg Oral Daily   ipratropium-albuterol  3 mL Nebulization TID   mometasone-formoterol  2 puff Inhalation BID   montelukast  10 mg Oral QHS   predniSONE  40 mg Oral QAC breakfast   Continuous Infusions:  ceFEPime (MAXIPIME) IV Stopped (09/06/22 0958)   metronidazole Stopped (09/06/22 0748)   PRN Meds:.acetaminophen **OR** acetaminophen, albuterol, ondansetron **OR** ondansetron (ZOFRAN) IV, mouth rinse, prochlorperazine  Current Outpatient Medications  Medication Instructions   albuterol (PROVENTIL) 2.5 mg, Nebulization, Every 6 hours PRN   albuterol (VENTOLIN HFA) 108 (90 Base) MCG/ACT inhaler 2 puffs, Inhalation, Every 6 hours PRN   ascorbic acid (VITAMIN C) 500 mg, Oral, Daily   bictegravir-emtricitabine-tenofovir AF (BIKTARVY) 50-200-25  MG TABS tablet 1  tablet, Oral, Daily   predniSONE (DELTASONE) 10 mg, Oral, Daily with breakfast   zinc sulfate 220 mg, Oral, Daily    Diet Orders (From admission, onward)     Start     Ordered   09/06/22 0759  Diet regular Room service appropriate? Yes; Fluid consistency: Thin  Diet effective now       Question Answer Comment  Room service appropriate? Yes   Fluid consistency: Thin      09/06/22 0758            DVT prophylaxis: Place and maintain sequential compression device Start: 09/04/22 1352   Lab Results  Component Value Date   PLT 130 (L) 09/06/2022      Code Status: Full Code  Family Communication: no family at bedside   Status is: Inpatient  Remains inpatient appropriate because: severity of illness  Level of care: Med-Surg  Consultants:  GI  Objective: Vitals:   09/06/22 0800 09/06/22 0816 09/06/22 0900 09/06/22 1000  BP: (!) 123/95  (!) 147/94 133/88  Pulse: (!) 56 69 73 84  Resp: 14 18 (!) 22 (!) 24  Temp: 99.2 F (37.3 C)     TempSrc: Oral     SpO2: 95% 96% 94% 95%  Weight:      Height:        Intake/Output Summary (Last 24 hours) at 09/06/2022 1033 Last data filed at 09/06/2022 0958 Gross per 24 hour  Intake 1932.59 ml  Output 450 ml  Net 1482.59 ml   Wt Readings from Last 3 Encounters:  09/04/22 98.9 kg  06/10/22 98.9 kg  12/19/21 98.4 kg    Examination:  Constitutional: NAD Eyes: no scleral icterus ENMT: Mucous membranes are moist.  Neck: normal, supple Respiratory: clear to auscultation bilaterally, no wheezing, no crackles. Normal respiratory effort. No accessory muscle use.  Cardiovascular: Regular rate and rhythm, no murmurs / rubs / gallops. No LE edema.  Abdomen: non distended, no tenderness. Bowel sounds positive.  Musculoskeletal: no clubbing / cyanosis.   Data Reviewed: I have independently reviewed following labs and imaging studies   CBC Recent Labs  Lab 09/04/22 0609 09/05/22 0255 09/06/22 0322  WBC 42.5* 23.0* 16.1*   HGB 12.5* 10.2* 9.8*  HCT 37.7* 31.3* 30.2*  PLT 130* 105* 130*  MCV 102.7* 104.0* 103.8*  MCH 34.1* 33.9 33.7  MCHC 33.2 32.6 32.5  RDW 12.8 12.7 12.5  LYMPHSABS 1.6 2.0 2.2  MONOABS 1.3* 1.1* 1.3*  EOSABS 0.0 0.2 0.0  BASOSABS 0.1 0.1 0.1    Recent Labs  Lab 09/04/22 0609 09/04/22 0830 09/05/22 0255 09/05/22 0710 09/06/22 0322  NA 136  --   --  134* 134*  K 3.6  --   --  3.5 3.5  CL 102  --   --  104 104  CO2 21*  --   --  22 22  GLUCOSE 131*  --   --  89 116*  BUN 67*  --   --  30* 22*  CREATININE 3.45*  --   --  1.61* 1.36*  CALCIUM 8.1*  --   --  7.7* 7.7*  AST 34  --   --  31 26  ALT 36  --   --  27 27  ALKPHOS 101  --   --  88 102  BILITOT 5.2*  --   --  2.6* 1.8*  ALBUMIN 3.5  --   --  2.9* 2.9*  CRP  --   --   --  11.6*  --   DDIMER 10.62*  --   --   --   --   PROCALCITON  --   --   --  89.79 43.44  LATICACIDVEN 1.8 1.7  --   --   --   INR 1.3*  --  1.2  --   --     ------------------------------------------------------------------------------------------------------------------ No results for input(s): "CHOL", "HDL", "LDLCALC", "TRIG", "CHOLHDL", "LDLDIRECT" in the last 72 hours.  No results found for: "HGBA1C" ------------------------------------------------------------------------------------------------------------------ No results for input(s): "TSH", "T4TOTAL", "T3FREE", "THYROIDAB" in the last 72 hours.  Invalid input(s): "FREET3"  Cardiac Enzymes No results for input(s): "CKMB", "TROPONINI", "MYOGLOBIN" in the last 168 hours.  Invalid input(s): "CK" ------------------------------------------------------------------------------------------------------------------    Component Value Date/Time   BNP 33.2 05/22/2020 0039    CBG: No results for input(s): "GLUCAP" in the last 168 hours.  Recent Results (from the past 240 hour(s))  Resp panel by RT-PCR (RSV, Flu A&B, Covid) Anterior Nasal Swab     Status: None   Collection Time: 09/04/22   6:09 AM   Specimen: Anterior Nasal Swab  Result Value Ref Range Status   SARS Coronavirus 2 by RT PCR NEGATIVE NEGATIVE Final    Comment: (NOTE) SARS-CoV-2 target nucleic acids are NOT DETECTED.  The SARS-CoV-2 RNA is generally detectable in upper respiratory specimens during the acute phase of infection. The lowest concentration of SARS-CoV-2 viral copies this assay can detect is 138 copies/mL. A negative result does not preclude SARS-Cov-2 infection and should not be used as the sole basis for treatment or other patient management decisions. A negative result may occur with  improper specimen collection/handling, submission of specimen other than nasopharyngeal swab, presence of viral mutation(s) within the areas targeted by this assay, and inadequate number of viral copies(<138 copies/mL). A negative result must be combined with clinical observations, patient history, and epidemiological information. The expected result is Negative.  Fact Sheet for Patients:  BloggerCourse.com  Fact Sheet for Healthcare Providers:  SeriousBroker.it  This test is no t yet approved or cleared by the [place] FDA and  has been authorized for detection and/or diagnosis of SARS-CoV-2 by FDA under an Emergency Use Authorization (EUA). This EUA will remain  in effect (meaning this test can be used) for the duration of the COVID-19 declaration under Section 564(b)(1) of the Act, 21 U.S.C.section 360bbb-3(b)(1), unless the authorization is terminated  or revoked sooner.       Influenza A by PCR NEGATIVE NEGATIVE Final   Influenza B by PCR NEGATIVE NEGATIVE Final    Comment: (NOTE) The Xpert Xpress SARS-CoV-2/FLU/RSV plus assay is intended as an aid in the diagnosis of influenza from Nasopharyngeal swab specimens and should not be used as a sole basis for treatment. Nasal washings and aspirates are unacceptable for Xpert Xpress  SARS-CoV-2/FLU/RSV testing.  Fact Sheet for Patients: BloggerCourse.com  Fact Sheet for Healthcare Providers: SeriousBroker.it  This test is not yet approved or cleared by the [place] FDA and has been authorized for detection and/or diagnosis of SARS-CoV-2 by FDA under an Emergency Use Authorization (EUA). This EUA will remain in effect (meaning this test can be used) for the duration of the COVID-19 declaration under Section 564(b)(1) of the Act, 21 U.S.C. section 360bbb-3(b)(1), unless the authorization is terminated or revoked.     Resp Syncytial Virus by PCR NEGATIVE NEGATIVE Final    Comment: (NOTE) Fact Sheet for Patients: BloggerCourse.com  Fact Sheet for Healthcare Providers: SeriousBroker.it  This test is not yet approved  or cleared by the [place] and has been authorized for detection and/or diagnosis of SARS-CoV-2 by FDA under an Emergency Use Authorization (EUA). This EUA will remain in effect (meaning this test can be used) for the duration of the COVID-19 declaration under Section 564(b)(1) of the Act, 21 U.S.C. section 360bbb-3(b)(1), unless the authorization is terminated or revoked.  Performed at [facility], [address] [place], [place] [address]   Blood Culture (routine x 2)     Status: None (Preliminary result)   Collection Time: 09/04/22  6:09 AM   Specimen: BLOOD RIGHT ARM  Result Value Ref Range Status   Specimen Description   Final    BLOOD RIGHT ARM Performed at [facility], [address] [place], [place] [address]    Special Requests   Final    BOTTLES DRAWN AEROBIC AND ANAEROBIC Blood Culture adequate volume Performed at [facility], [address], [place], [place] [address]    Culture   Final    NO GROWTH 2 DAYS Performed at [facility], [address]  [place], [place] [address]    Report Status PENDING  Incomplete  Blood Culture (routine x 2)     Status: None (Preliminary result)   Collection Time: 09/04/22  6:09 AM   Specimen: BLOOD LEFT ARM  Result Value Ref Range Status   Specimen Description   Final    BLOOD LEFT ARM Performed at [facility], [address] [place], [place] [address]    Special Requests   Final    BOTTLES DRAWN AEROBIC AND ANAEROBIC Blood Culture adequate volume Performed at [facility], [address], [place], [place] [address]    Culture   Final    NO GROWTH 2 DAYS Performed at [facility], [address] [place], [place] [address]    Report Status PENDING  Incomplete  Urine Culture     Status: Abnormal   Collection Time: 09/04/22  6:09 AM   Specimen: Urine, Random  Result Value Ref Range Status   Specimen Description   Final    URINE, RANDOM Performed at [facility], [address], [place], [place] [address]    Special Requests URINE, CLEAN CATCH  Final   Culture (A)  Final    <10,000 COLONIES/mL INSIGNIFICANT GROWTH Performed at [facility], [address], [place], [place] [address]    Report Status 09/05/2022 FINAL  Final  MRSA Next Gen by PCR, Nasal     Status: None   Collection Time: 09/04/22 11:13 AM   Specimen: Nasal Mucosa; Nasal Swab  Result Value Ref Range Status   MRSA by PCR Next Gen NOT DETECTED NOT DETECTED Final    Comment: (NOTE) The GeneXpert MRSA Assay (FDA approved for NASAL specimens only), is one component of a comprehensive MRSA colonization surveillance program. It is not intended to diagnose MRSA infection nor to guide or monitor treatment for MRSA infections. Test performance is not FDA approved in patients less than 24 years old. Performed at [facility], [address] [place], [place] [address]   C Difficile Quick Screen w PCR reflex     Status: None   Collection Time: 09/05/22 12:50  AM   Specimen: STOOL  Result Value Ref Range Status   C Diff antigen NEGATIVE NEGATIVE Final   C Diff toxin NEGATIVE NEGATIVE Final   C Diff interpretation No C.  difficile detected.  Final    Comment: Performed at [facility], [address] [place], [place] [address]  Expectorated Sputum Assessment w Gram Stain, Rflx to Resp Cult     Status: None   Collection Time: 09/05/22  1:16 PM   Specimen: Expectorated Sputum  Result Value Ref Range Status   Specimen Description EXPECTORATED SPUTUM  Final   Special Requests NONE  Final   Sputum evaluation   Final    THIS SPECIMEN IS ACCEPTABLE FOR SPUTUM CULTURE Performed at [facility], [address] [place], [place] [address]    Report Status 09/05/2022 FINAL  Final  Culture, Respiratory w Gram Stain     Status: None (Preliminary result)   Collection Time: 09/05/22  1:16 PM  Result Value Ref Range Status   Specimen Description   Final    EXPECTORATED SPUTUM Performed at [facility], [address] [place], [place] [address]    Special Requests   Final    NONE Reflexed from [id number] Performed at [facility], [address], [place], [place] [address]    Gram Stain   Final    FEW SQUAMOUS EPITHELIAL CELLS PRESENT NO WBC SEEN ABUNDANT GRAM NEGATIVE RODS FEW GRAM POSITIVE COCCI Performed at [facility], [address], [place], [place] [address]    Culture PENDING  Incomplete   Report Status PENDING  Incomplete     Radiology Studies: VAS [place] LOWER EXTREMITY VENOUS (DVT)  Result Date: 09/05/2022  Lower Venous DVT Study Patient Name:  Wyman CHANDLER ANDERSEN  Date of Exam:   09/05/2022 Medical Rec #: [id number]            Accession #:    [id number] Date of Birth: [birth year]             Patient Gender: M Patient Age:   57 years Exam Location:  [facility] Procedure:      VAS [place] LOWER EXTREMITY VENOUS (DVT) Referring Phys: RIPUDEEP RAI  --------------------------------------------------------------------------------  Indications: Edema, and elevated D-dimer (10.62).  Comparison Study: No previous exams Performing Technologist: Jody Hill RVT, RDMS  Examination Guidelines: A complete evaluation includes B-mode imaging, spectral Doppler, color Doppler, and power Doppler as needed of all accessible portions of each vessel. Bilateral testing is considered an integral part of a complete examination. Limited examinations for reoccurring indications may be performed as noted. The reflux portion of the exam is performed with the patient in reverse Trendelenburg.  +---------+---------------+---------+-----------+----------+--------------+ RIGHT    CompressibilityPhasicitySpontaneityPropertiesThrombus Aging +---------+---------------+---------+-----------+----------+--------------+ CFV      Full           Yes      Yes                                 +---------+---------------+---------+-----------+----------+--------------+ SFJ      Full                                                        +---------+---------------+---------+-----------+----------+--------------+ FV Prox  Full           Yes      Yes                                 +---------+---------------+---------+-----------+----------+--------------+  FV Mid   Full           Yes      Yes                                 +---------+---------------+---------+-----------+----------+--------------+ FV DistalFull           Yes      Yes                                 +---------+---------------+---------+-----------+----------+--------------+ PFV      Full                                                        +---------+---------------+---------+-----------+----------+--------------+ POP      Full           Yes      Yes                                 +---------+---------------+---------+-----------+----------+--------------+ PTV      Full                                                         +---------+---------------+---------+-----------+----------+--------------+ PERO     Full                                                        +---------+---------------+---------+-----------+----------+--------------+   +---------+---------------+---------+-----------+----------+--------------+ LEFT     CompressibilityPhasicitySpontaneityPropertiesThrombus Aging +---------+---------------+---------+-----------+----------+--------------+ CFV      Full           Yes      Yes                                 +---------+---------------+---------+-----------+----------+--------------+ SFJ      Full                                                        +---------+---------------+---------+-----------+----------+--------------+ FV Prox  Full           Yes      Yes                                 +---------+---------------+---------+-----------+----------+--------------+ FV Mid   Full           Yes      Yes                                 +---------+---------------+---------+-----------+----------+--------------+ FV DistalFull  Yes      Yes                                 +---------+---------------+---------+-----------+----------+--------------+ PFV      Full                                                        +---------+---------------+---------+-----------+----------+--------------+ POP      Full           Yes      Yes                                 +---------+---------------+---------+-----------+----------+--------------+ PTV      Full                                                        +---------+---------------+---------+-----------+----------+--------------+ PERO     Full                                                        +---------+---------------+---------+-----------+----------+--------------+     Summary: BILATERAL: - No evidence of deep vein thrombosis seen in the  lower extremities, bilaterally. -No evidence of popliteal cyst, bilaterally.   *See table(s) above for measurements and observations. Electronically signed by Waverly Ferrari MD on 09/05/2022 at 6:16:53 PM.    Final      Pamella Pert, MD, PhD [facility]  Between 7 am - 7 pm I am available, please contact me via Amion (for emergencies) or Securechat (non urgent messages)  Between 7 pm - 7 am I am not available, please contact night coverage MD/APP via Amion

## 2022-09-07 DIAGNOSIS — A419 Sepsis, unspecified organism: Secondary | ICD-10-CM | POA: Diagnosis not present

## 2022-09-07 LAB — CBC WITH DIFFERENTIAL/PLATELET
Abs Immature Granulocytes: 0.6 10*3/uL — ABNORMAL HIGH (ref 0.00–0.07)
Basophils Absolute: 0.1 10*3/uL (ref 0.0–0.1)
Basophils Relative: 1 %
Eosinophils Absolute: 0.1 10*3/uL (ref 0.0–0.5)
Eosinophils Relative: 1 %
HCT: 29.4 % — ABNORMAL LOW (ref 39.0–52.0)
Hemoglobin: 9.8 g/dL — ABNORMAL LOW (ref 13.0–17.0)
Immature Granulocytes: 6 %
Lymphocytes Relative: 27 %
Lymphs Abs: 2.6 10*3/uL (ref 0.7–4.0)
MCH: 33.8 pg (ref 26.0–34.0)
MCHC: 33.3 g/dL (ref 30.0–36.0)
MCV: 101.4 fL — ABNORMAL HIGH (ref 80.0–100.0)
Monocytes Absolute: 1.5 10*3/uL — ABNORMAL HIGH (ref 0.1–1.0)
Monocytes Relative: 15 %
Neutro Abs: 4.8 10*3/uL (ref 1.7–7.7)
Neutrophils Relative %: 50 %
Platelets: 136 10*3/uL — ABNORMAL LOW (ref 150–400)
RBC: 2.9 MIL/uL — ABNORMAL LOW (ref 4.22–5.81)
RDW: 12.6 % (ref 11.5–15.5)
WBC: 9.6 10*3/uL (ref 4.0–10.5)
nRBC: 0.2 % (ref 0.0–0.2)

## 2022-09-07 LAB — LEGIONELLA PNEUMOPHILA SEROGP 1 UR AG: L. pneumophila Serogp 1 Ur Ag: NEGATIVE

## 2022-09-07 LAB — COMPREHENSIVE METABOLIC PANEL
ALT: 31 U/L (ref 0–44)
AST: 28 U/L (ref 15–41)
Albumin: 2.8 g/dL — ABNORMAL LOW (ref 3.5–5.0)
Alkaline Phosphatase: 82 U/L (ref 38–126)
Anion gap: 5 (ref 5–15)
BUN: 17 mg/dL (ref 6–20)
CO2: 23 mmol/L (ref 22–32)
Calcium: 7.9 mg/dL — ABNORMAL LOW (ref 8.9–10.3)
Chloride: 106 mmol/L (ref 98–111)
Creatinine, Ser: 1.23 mg/dL (ref 0.61–1.24)
GFR, Estimated: >60 mL/min (ref >60)
Glucose, Bld: 107 mg/dL — ABNORMAL HIGH (ref 70–99)
Potassium: 3 mmol/L — ABNORMAL LOW (ref 3.5–5.1)
Sodium: 134 mmol/L — ABNORMAL LOW (ref 135–145)
Total Bilirubin: 1.2 mg/dL (ref 0.3–1.2)
Total Protein: 6.3 g/dL — ABNORMAL LOW (ref 6.5–8.1)

## 2022-09-07 LAB — PROCALCITONIN: Procalcitonin: 21.33 ng/mL

## 2022-09-07 LAB — CULTURE, RESPIRATORY W GRAM STAIN: Culture: NORMAL

## 2022-09-07 LAB — CANCER ANTIGEN 19-9: CA 19-9: <2 U/mL (ref 0–35)

## 2022-09-07 LAB — CEA: CEA: 4.1 ng/mL (ref 0.0–4.7)

## 2022-09-07 LAB — MAGNESIUM: Magnesium: 2 mg/dL (ref 1.7–2.4)

## 2022-09-07 LAB — CULTURE, BLOOD (ROUTINE X 2)
Culture: NO GROWTH
Special Requests: ADEQUATE

## 2022-09-07 LAB — AFP TUMOR MARKER: AFP, Serum, Tumor Marker: 3.5 ng/mL (ref 0.0–8.4)

## 2022-09-07 MED ORDER — METRONIDAZOLE 500 MG PO TABS: 500.0000 mg | ORAL_TABLET | Freq: Three times a day (TID) | ORAL | 0 refills | Status: DC

## 2022-09-07 MED ORDER — POTASSIUM CHLORIDE CRYS ER 20 MEQ PO TBCR
40.0000 meq | EXTENDED_RELEASE_TABLET | ORAL | Status: DC
  Administered 2022-09-07: 40 meq via ORAL
  Filled 2022-09-07: qty 2

## 2022-09-07 MED ORDER — METRONIDAZOLE 500 MG PO TABS: 500.0000 mg | ORAL_TABLET | Freq: Two times a day (BID) | ORAL | 0 refills | Status: AC

## 2022-09-07 MED ORDER — FOLIC ACID 1 MG PO TABS: 1.0000 mg | ORAL_TABLET | Freq: Every day | ORAL | 0 refills | Status: AC

## 2022-09-07 MED ORDER — IPRATROPIUM-ALBUTEROL 0.5-2.5 (3) MG/3ML IN SOLN: 3.0000 mL | Freq: Two times a day (BID) | RESPIRATORY_TRACT | Status: DC

## 2022-09-07 NOTE — Discharge Instructions (Signed)
Follow with Dr. Dulce Sellar in 2-3 weeks  Please get a complete blood count and chemistry panel checked by your Primary MD at your next visit, and again as instructed by your Primary MD. Please get your medications reviewed and adjusted by your Primary MD.  Please request your Primary MD to go over all Hospital Tests and Procedure/Radiological results at the follow up, please get all Hospital records sent to your Prim MD by signing hospital release before you go home.  In some cases, there will be blood work, cultures and biopsy results pending at the time of your discharge. Please request that your primary care M.D. goes through all the records of your hospital data and follows up on these results.  If you had Pneumonia of Lung problems at the Hospital: Please get a 2 view Chest X ray done in 6-8 weeks after hospital discharge or sooner if instructed by your Primary MD.  If you have Congestive Heart Failure: Please call your Cardiologist or Primary MD anytime you have any of the following symptoms:  1) 3 pound weight gain in 24 hours or 5 pounds in 1 week  2) shortness of breath, with or without a dry hacking cough  3) swelling in the hands, feet or stomach  4) if you have to sleep on extra pillows at night in order to breathe  Follow cardiac low salt diet and 1.5 lit/day fluid restriction.  If you have diabetes Accuchecks 4 times/day, Once in AM empty stomach and then before each meal. Log in all results and show them to your primary doctor at your next visit. If any glucose reading is under 80 or above 300 call your primary MD immediately.  If you have Seizure/Convulsions/Epilepsy: Please do not drive, operate heavy machinery, participate in activities at heights or participate in high speed sports until you have seen by Primary MD or a Neurologist and advised to do so again. Per [facility] statutes, patients with seizures are not allowed to drive until they have been seizure-free  for six months.  Use caution when using heavy equipment or power tools. Avoid working on ladders or at heights. Take showers instead of baths. Ensure the water temperature is not too high on the home water heater. Do not go swimming alone. Do not lock yourself in a room alone (i.e. bathroom). When caring for infants or small children, sit down when holding, feeding, or changing them to minimize risk of injury to the child in the event you have a seizure. Maintain good sleep hygiene. Avoid alcohol.   If you had Gastrointestinal Bleeding: Please ask your Primary MD to check a complete blood count within one week of discharge or at your next visit. Your endoscopic/colonoscopic biopsies that are pending at the time of discharge, will also need to followed by your Primary MD.  Get Medicines reviewed and adjusted. Please take all your medications with you for your next visit with your Primary MD  Please request your Primary MD to go over all hospital tests and procedure/radiological results at the follow up, please ask your Primary MD to get all Hospital records sent to his/her office.  If you experience worsening of your admission symptoms, develop shortness of breath, life threatening emergency, suicidal or homicidal thoughts you must seek medical attention immediately by calling 911 or calling your MD immediately  if symptoms less severe.  You must read complete instructions/literature along with all the possible adverse reactions/side effects for all the Medicines you take and  that have been prescribed to you. Take any new Medicines after you have completely understood and accpet all the possible adverse reactions/side effects.   Do not drive or operate heavy machinery when taking Pain medications.   Do not take more than prescribed Pain, Sleep and Anxiety Medications  Special Instructions: If you have smoked or chewed Tobacco  in the last 2 yrs please stop smoking, stop any regular Alcohol  and or  any Recreational drug use.  Wear Seat belts while driving.  Please note You were cared for by a hospitalist during your hospital stay. If you have any questions about your discharge medications or the care you received while you were in the hospital after you are discharged, you can call the unit and asked to speak with the hospitalist on call if the hospitalist that took care of you is not available. Once you are discharged, your primary care physician will handle any further medical issues. Please note that NO REFILLS for any discharge medications will be authorized once you are discharged, as it is imperative that you return to your primary care physician (or establish a relationship with a primary care physician if you do not have one) for your aftercare needs so that they can reassess your need for medications and monitor your lab values.  You can reach the hospitalist office at phone [phone] or fax [phone]   If you do not have a primary care physician, you can call [phone] for a physician referral.  Activity: As tolerated with Full fall precautions use walker/cane & assistance as needed    Diet: regular  Disposition Home

## 2022-09-07 NOTE — Discharge Summary (Addendum)
Physician Discharge Summary  [name] KGS:[id number] DOB: 08/21/1965 DOA: 09/04/2022  PCP: No primary care provider on file.  Admit date: 09/04/2022 Discharge date: 09/07/2022  Admitted From: home Disposition:  home  Recommendations for Outpatient Follow-up:  Follow up with PCP in 1-2 weeks  Home Health: none Equipment/Devices: none  Discharge Condition: stable CODE STATUS: Full code Diet Orders (From admission, onward)     Start     Ordered   09/06/22 0759  Diet regular Room service appropriate? Yes; Fluid consistency: Thin  Diet effective now       Question Answer Comment  Room service appropriate? Yes   Fluid consistency: Thin      09/06/22 0758            HPI: Per admitting MD, David [name] is a 57 y.o. male with medical history significant of asthma, former 70-pack-year smoker, history of syphilis, HIV disease on antiretroviral therapy, hypertension, overweight who is coming to the emergency department with complaints of 5-6 episodes of diarrhea that started over the weekend, associated with 1 episode of emesis, mild abdominal discomfort, subjective fever/night sweats/chills on Saturday evening, fatigue, dyspnea and occasionally productive cough for clear sputum.  No sick contacts or travel history.  No constipation, melena or hematochezia.  No flank pain, dysuria, frequency or hematuria.He denied sore throat, wheezing or hemoptysis.  No chest pain, palpitations, diaphoresis, PND, orthopnea or pitting edema of the lower extremities.   No polyuria, polydipsia, polyphagia or blurred vision.   Hospital Course / Discharge diagnoses: Principal Problem:   Sepsis due to undetermined organism Active Problems:   HIV disease   Hypertension   Emphysema due to alpha-1-antitrypsin deficiency   Hyperbilirubinemia   AKI (acute kidney injury)   Positive D dimer   Thrombocytopenia   Leukocytosis   Macrocytic anemia   Hypocalcemia   Liver lesion   Principal  Problem: Sepsis due to undetermined organism, POA -Presented with tachycardia, tachypnea, leukocytosis, mild hypoxia, leukocytosis, acute kidney injury, elevated procalcitonin 89.79.  Patient also complained of GI upset after he ate from a food truck, suggesting food poisoning/GI infection.  He was started on broad-spectrum antibiotics with improvement in his clinical condition.  His GI symptoms resolved, he is tolerating a regular diet, will be narrowed to metronidazole and discharged home in stable condition.  Cultures have remained negative so far.  Active Problems: Severe emphysema, possibly due to AAT deficiency, smoking history, COPD exacerbation, acute respiratory failure with hypoxia, possible polycystic liver disease-GI consulted and followed patient, underwent an MRI of the liver which showed multiple cysts concerning for polycystic liver disease.  Will need outpatient follow-up with GI and eventually for to a transplant center, will also need to be reevaluated for AAT deficiency, levels here were elevated but it can be an acute phase reactant. Elevated D-dimer - lower extremity Dopplers negative for DVT, VQ scan negative for PE. Gastroenteritis, in numerous liver cysts/cirrhosis on CT, hyperbilirubinemia -With polycystic liver disease, possible due to AAT deficiency.  No symptoms currently, outpatient follow-up HIV disease -Continue Biktarvy.  Controlled, follows with ID as an outpatient AKI (acute kidney injury) -Likely due to #1, presented with creatinine of 3.45.  Creatinine normalized with IV fluids Hypokalemia-due to GI losses, replaced prior to discharge Chronic macrocytic anemia, folate deficiency -MCV 104.0, B12 normal, folate low.  Start folic acid supplements.    Discharge Instructions   Allergies as of 09/07/2022       Reactions   Penicillins Swelling   Tolerated cefepime  April 2024        Medication List     TAKE these medications    albuterol 108 (90 Base) MCG/ACT  inhaler Commonly known as: VENTOLIN HFA Inhale 2 puffs into the lungs every 6 (six) hours as needed for wheezing or shortness of breath.   albuterol (2.5 MG/3ML) 0.083% nebulizer solution Commonly known as: PROVENTIL Take 3 mLs (2.5 mg total) by nebulization every 6 (six) hours as needed for wheezing or shortness of breath.   ascorbic acid 500 MG tablet Commonly known as: VITAMIN C Take 1 tablet (500 mg total) by mouth daily.   Biktarvy 50-200-25 MG Tabs tablet Generic drug: bictegravir-emtricitabine-tenofovir AF Take 1 tablet by mouth daily.   folic acid 1 MG tablet Commonly known as: FOLVITE Take 1 tablet (1 mg total) by mouth daily. Start taking on: September 08, 2022   metroNIDAZOLE 500 MG tablet Commonly known as: Flagyl Take 1 tablet (500 mg total) by mouth 2 (two) times daily for 4 days.   predniSONE 10 MG tablet Commonly known as: DELTASONE TAKE 1 TABLET (10 MG TOTAL) BY MOUTH DAILY WITH BREAKFAST.   zinc sulfate 220 (50 Zn) MG capsule Take 1 capsule (220 mg total) by mouth daily.        Follow-up Information     Willis Modena, MD Follow up.   Specialty: Gastroenterology Contact information: [address] [place] [address] [phone]                 Consultations: GI - Dr Dulce Sellar  Procedures/Studies:  MR LIVER W WO CONTRAST  Result Date: 09/06/2022 CLINICAL DATA:  Liver lesions EXAM: MRI ABDOMEN WITHOUT AND WITH CONTRAST TECHNIQUE: Multiplanar multisequence MR imaging of the abdomen was performed both before and after the administration of intravenous contrast. CONTRAST:  10mL GADAVIST GADOBUTROL 1 MMOL/ML IV SOLN COMPARISON:  CT chest, 09/03/2021 FINDINGS: Lower chest: No acute abnormality. Hepatobiliary: Innumerable fluid signal cystic lesions throughout the liver parenchyma of varying sizes, the great majority very tiny and subcentimeter. Hepatomegaly, maximum coronal span 21.0 cm. Gallstones. No gallbladder wall thickening or  biliary ductal dilatation. Pancreas: Unremarkable. No pancreatic ductal dilatation or surrounding inflammatory changes. Spleen: Normal in size. Incidental, benign cyst of the anterior spleen, which no further follow-up or characterization required. Adrenals/Urinary Tract: Adrenal glands are unremarkable. Multiple simple, benign bilateral renal cortical cysts, for which no further follow-up or characterization is required. Kidneys are otherwise normal, without renal calculi, solid lesion, or hydronephrosis. Stomach/Bowel: Stomach is within normal limits. No evidence of bowel wall thickening, distention, or inflammatory changes. Vascular/Lymphatic: Aortic atherosclerosis. No enlarged abdominal lymph nodes. Other: No abdominal wall hernia or abnormality. No ascites. Musculoskeletal: No acute or significant osseous findings. IMPRESSION: 1. Innumerable fluid signal cystic lesions throughout the liver parenchyma of varying sizes, the great majority very tiny and subcentimeter. Findings are consistent with polycystic liver disease. 2. Hepatomegaly. 3. Cholelithiasis. Electronically Signed   By: Jearld Lesch M.D.   On: 09/06/2022 22:05   VAS [place] LOWER EXTREMITY VENOUS (DVT)  Result Date: 09/05/2022  Lower Venous DVT Study Patient Name:  David [name]  Date of Exam:   09/05/2022 Medical Rec #: [id number]            Accession #:    [id number] Date of Birth: March 03, 1966             Patient Gender: M Patient Age:   89 years Exam Location:  [facility] Procedure:  VAS [place] LOWER EXTREMITY VENOUS (DVT) Referring Phys: RIPUDEEP RAI --------------------------------------------------------------------------------  Indications: Edema, and elevated D-dimer (10.62).  Comparison Study: No previous exams Performing Technologist: Jody Hill RVT, RDMS  Examination Guidelines: A complete evaluation includes B-mode imaging, spectral Doppler, color Doppler, and power Doppler as needed of all accessible portions of each vessel.  Bilateral testing is considered an integral part of a complete examination. Limited examinations for reoccurring indications may be performed as noted. The reflux portion of the exam is performed with the patient in reverse Trendelenburg.  +---------+---------------+---------+-----------+----------+--------------+ RIGHT    CompressibilityPhasicitySpontaneityPropertiesThrombus Aging +---------+---------------+---------+-----------+----------+--------------+ CFV      Full           Yes      Yes                                 +---------+---------------+---------+-----------+----------+--------------+ SFJ      Full                                                        +---------+---------------+---------+-----------+----------+--------------+ FV Prox  Full           Yes      Yes                                 +---------+---------------+---------+-----------+----------+--------------+ FV Mid   Full           Yes      Yes                                 +---------+---------------+---------+-----------+----------+--------------+ FV DistalFull           Yes      Yes                                 +---------+---------------+---------+-----------+----------+--------------+ PFV      Full                                                        +---------+---------------+---------+-----------+----------+--------------+ POP      Full           Yes      Yes                                 +---------+---------------+---------+-----------+----------+--------------+ PTV      Full                                                        +---------+---------------+---------+-----------+----------+--------------+ PERO     Full                                                        +---------+---------------+---------+-----------+----------+--------------+   +---------+---------------+---------+-----------+----------+--------------+  LEFT      CompressibilityPhasicitySpontaneityPropertiesThrombus Aging +---------+---------------+---------+-----------+----------+--------------+ CFV      Full           Yes      Yes                                 +---------+---------------+---------+-----------+----------+--------------+ SFJ      Full                                                        +---------+---------------+---------+-----------+----------+--------------+ FV Prox  Full           Yes      Yes                                 +---------+---------------+---------+-----------+----------+--------------+ FV Mid   Full           Yes      Yes                                 +---------+---------------+---------+-----------+----------+--------------+ FV DistalFull           Yes      Yes                                 +---------+---------------+---------+-----------+----------+--------------+ PFV      Full                                                        +---------+---------------+---------+-----------+----------+--------------+ POP      Full           Yes      Yes                                 +---------+---------------+---------+-----------+----------+--------------+ PTV      Full                                                        +---------+---------------+---------+-----------+----------+--------------+ PERO     Full                                                        +---------+---------------+---------+-----------+----------+--------------+     Summary: BILATERAL: - No evidence of deep vein thrombosis seen in the lower extremities, bilaterally. -No evidence of popliteal cyst, bilaterally.   *See table(s) above for measurements and observations. Electronically signed by Waverly Ferrari MD on 09/05/2022 at 6:16:53 PM.    Final    NM Pulmonary Perfusion  Result Date: 09/04/2022 CLINICAL DATA:  PE EXAM: NUCLEAR  MEDICINE PERFUSION LUNG SCAN TECHNIQUE: Perfusion images  were obtained in multiple projections after intravenous injection of radiopharmaceutical. Ventilation scans intentionally deferred if perfusion scan and chest x-ray adequate for interpretation during COVID 19 epidemic. RADIOPHARMACEUTICALS:  4.4 mCi Tc-21m MAA IV COMPARISON:  None Available. FINDINGS: Perfusion images demonstrate no evidence of significant segmental or subsegmental perfusion defects to suggest the presence of PE. IMPRESSION: No perfusion defects identified to suggest PE. Electronically Signed   By: Layla Maw M.D.   On: 09/04/2022 13:42   US Abdomen Limited RUQ (LIVER/GB)  Result Date: 09/04/2022 CLINICAL DATA:  [address] Hyperbilirubinemia [address].  HIV EXAM: ULTRASOUND ABDOMEN LIMITED RIGHT UPPER QUADRANT COMPARISON:  None Available. FINDINGS: Gallbladder: Gallbladder sludge within the gallbladder lumen. Gallbladder wall thickening. No pericholecystic fluid. No sonographic Murphy sign noted by sonographer. Common bile duct: Diameter: 7 mm. Liver: Total of 3 indeterminate hepatic lesions identified: 2.8 x 1.9 x 2.5 cm cystic lesion, 2.1 x 1.6 x 1.9 cm cystic lesion, 1.2 x 1.1 x 0.9 cm solid hyperechoic lesion. Nodular hepatic contour. Coarsened parenchymal echogenicity. Portal vein is patent on color Doppler imaging with normal direction of blood flow towards the liver. Other: None. IMPRESSION: 1. Gallbladder sludge with nonspecific gallbladder wall thickening which can be seen in the setting of chronic hepatic disease. No definite findings of acute cholecystitis. 2. Cirrhotic morphology of the liver with three indeterminate left hepatic lobe lesions, two cystic and one solid hyperechoic. Hepatocellular carcinoma cannot be excluded in the setting of cirrhosis. Recommend MRI liver protocol for further evaluation. When the patient is clinically stable and able to follow directions and hold their breath (preferably as an outpatient) further evaluation with dedicated abdominal MRI should be  considered. Electronically Signed   By: Tish Frederickson M.D.   On: 09/04/2022 08:47   CT Chest Wo Contrast  Result Date: 09/04/2022 CLINICAL DATA:  SOB EXAM: CT CHEST WITHOUT CONTRAST TECHNIQUE: Multidetector CT imaging of the chest was performed following the standard protocol without IV contrast. RADIATION DOSE REDUCTION: This exam was performed according to the departmental dose-optimization program which includes automated exposure control, adjustment of the mA and/or kV according to patient size and/or use of iterative reconstruction technique. COMPARISON:  None Available. FINDINGS: Cardiovascular: No significant vascular findings. Normal heart size. No pericardial effusion. Mediastinum/Nodes: No enlarged mediastinal or axillary lymph nodes. Thyroid gland, trachea, and esophagus demonstrate no significant findings. Lungs/Pleura: Extensive diffuse bilateral pulmonary parenchymal scarring consistent with panacinar emphysema. Minimal bibasilar subsegmental atelectasis. No pleural or pericardial effusion. Upper Abdomen: Innumerable hepatic lesions consistent with cysts. No adrenal lesions identified. Musculoskeletal: No chest wall mass or suspicious bone lesions identified. IMPRESSION: Extensive diffuse panacinar emphysematous changes of the lungs and innumerable cysts in the liver. These findings suggest alpha-1 antitrypsin deficiency. Electronically Signed   By: Layla Maw M.D.   On: 09/04/2022 08:31   DG Chest 2 View  Result Date: 09/04/2022 CLINICAL DATA:  57 year old male with coughing, weakness, vomiting. EXAM: CHEST - 2 VIEW COMPARISON:  Chest radiographs 06/10/2022 and earlier. FINDINGS: PA and lateral views at 0604 hours. Chronic flattening of the diaphragms suggesting pulmonary hyperinflation. Mediastinal contours remain normal. Visualized tracheal air column is within normal limits. No pneumothorax, pulmonary edema, pleural effusion or confluent pulmonary opacity. No acute osseous abnormality  identified. Paucity of bowel gas in the upper abdomen. IMPRESSION: No acute cardiopulmonary abnormality. Suspicion of chronic pulmonary hyperinflation. Electronically Signed   By: Odessa Fleming M.D.   On: 09/04/2022 06:13     Subjective: - no  chest pain, shortness of breath, no abdominal pain, nausea or vomiting.   Discharge Exam: BP (!) 141/92 (BP Location: Right Arm)   Pulse 88   Temp 98.1 F (36.7 C) (Oral)   Resp 18   Ht  (1.854 m)   Wt 98.9 kg   SpO2 97%   BMI 28.77 kg/m   General: Pt is alert, awake, not in acute distress Cardiovascular: RRR, S1/S2 +, no rubs, no gallops Respiratory: CTA bilaterally, no wheezing, no rhonchi Abdominal: Soft, NT, ND, bowel sounds + Extremities: no edema, no cyanosis    The results of significant diagnostics from this hospitalization (including imaging, microbiology, ancillary and laboratory) are listed below for reference.     Microbiology: Recent Results (from the past 240 hour(s))  Resp panel by RT-PCR (RSV, Flu A&B, Covid) Anterior Nasal Swab     Status: None   Collection Time: 09/04/22  6:09 AM   Specimen: Anterior Nasal Swab  Result Value Ref Range Status   SARS Coronavirus 2 by RT PCR NEGATIVE NEGATIVE Final    Comment: (NOTE) SARS-CoV-2 target nucleic acids are NOT DETECTED.  The SARS-CoV-2 RNA is generally detectable in upper respiratory specimens during the acute phase of infection. The lowest concentration of SARS-CoV-2 viral copies this assay can detect is 138 copies/mL. A negative result does not preclude SARS-Cov-2 infection and should not be used as the sole basis for treatment or other patient management decisions. A negative result may occur with  improper specimen collection/handling, submission of specimen other than nasopharyngeal swab, presence of viral mutation(s) within the areas targeted by this assay, and inadequate number of viral copies(<138 copies/mL). A negative result must be combined with clinical  observations, patient history, and epidemiological information. The expected result is Negative.  Fact Sheet for Patients:  BloggerCourse.com  Fact Sheet for Healthcare Providers:  SeriousBroker.it  This test is no t yet approved or cleared by the [place] FDA and  has been authorized for detection and/or diagnosis of SARS-CoV-2 by FDA under an Emergency Use Authorization (EUA). This EUA will remain  in effect (meaning this test can be used) for the duration of the COVID-19 declaration under Section 564(b)(1) of the Act, 21 U.S.C.section 360bbb-3(b)(1), unless the authorization is terminated  or revoked sooner.       Influenza A by PCR NEGATIVE NEGATIVE Final   Influenza B by PCR NEGATIVE NEGATIVE Final    Comment: (NOTE) The Xpert Xpress SARS-CoV-2/FLU/RSV plus assay is intended as an aid in the diagnosis of influenza from Nasopharyngeal swab specimens and should not be used as a sole basis for treatment. Nasal washings and aspirates are unacceptable for Xpert Xpress SARS-CoV-2/FLU/RSV testing.  Fact Sheet for Patients: BloggerCourse.com  Fact Sheet for Healthcare Providers: SeriousBroker.it  This test is not yet approved or cleared by the [place] FDA and has been authorized for detection and/or diagnosis of SARS-CoV-2 by FDA under an Emergency Use Authorization (EUA). This EUA will remain in effect (meaning this test can be used) for the duration of the COVID-19 declaration under Section 564(b)(1) of the Act, 21 U.S.C. section 360bbb-3(b)(1), unless the authorization is terminated or revoked.     Resp Syncytial Virus by PCR NEGATIVE NEGATIVE Final    Comment: (NOTE) Fact Sheet for Patients: BloggerCourse.com  Fact Sheet for Healthcare Providers: SeriousBroker.it  This test is not yet approved or cleared by  the [place] FDA and has been authorized for detection and/or diagnosis of SARS-CoV-2 by FDA under an Emergency Use Authorization (  EUA). This EUA will remain in effect (meaning this test can be used) for the duration of the COVID-19 declaration under Section 564(b)(1) of the Act, 21 U.S.C. section 360bbb-3(b)(1), unless the authorization is terminated or revoked.  Performed at [facility], [address] [place], [place] [address]   Blood Culture (routine x 2)     Status: None (Preliminary result)   Collection Time: 09/04/22  6:09 AM   Specimen: BLOOD RIGHT ARM  Result Value Ref Range Status   Specimen Description   Final    BLOOD RIGHT ARM Performed at [facility], [address], [place], [place] [address]    Special Requests   Final    BOTTLES DRAWN AEROBIC AND ANAEROBIC Blood Culture adequate volume Performed at [facility], [address] [place], [place] [address]    Culture   Final    NO GROWTH 3 DAYS Performed at [facility], [address], [place], [place] [address]    Report Status PENDING  Incomplete  Blood Culture (routine x 2)     Status: None (Preliminary result)   Collection Time: 09/04/22  6:09 AM   Specimen: BLOOD LEFT ARM  Result Value Ref Range Status   Specimen Description   Final    BLOOD LEFT ARM Performed at [facility], [address], [place], [place] [address]    Special Requests   Final    BOTTLES DRAWN AEROBIC AND ANAEROBIC Blood Culture adequate volume Performed at [facility], [address], [place], [place] [address]    Culture   Final    NO GROWTH 3 DAYS Performed at [facility], [address] [place], [place] [address]    Report Status PENDING  Incomplete  Urine Culture     Status: Abnormal   Collection Time: 09/04/22  6:09 AM   Specimen: Urine, Random  Result Value Ref Range Status   Specimen Description   Final    URINE,  RANDOM Performed at [facility], [address] [place], [place] [address]    Special Requests URINE, CLEAN CATCH  Final   Culture (A)  Final    <10,000 COLONIES/mL INSIGNIFICANT GROWTH Performed at [facility], [address], [place], [place] [address]    Report Status 09/05/2022 FINAL  Final  MRSA Next Gen by PCR, Nasal     Status: None   Collection Time: 09/04/22 11:13 AM   Specimen: Nasal Mucosa; Nasal Swab  Result Value Ref Range Status   MRSA by PCR Next Gen NOT DETECTED NOT DETECTED Final    Comment: (NOTE) The GeneXpert MRSA Assay (FDA approved for NASAL specimens only), is one component of a comprehensive MRSA colonization surveillance program. It is not intended to diagnose MRSA infection nor to guide or monitor treatment for MRSA infections. Test performance is not FDA approved in patients less than 51 years old. Performed at [facility], [address] [place], [place] [address]   C Difficile Quick Screen w PCR reflex     Status: None   Collection Time: 09/05/22 12:50 AM   Specimen: STOOL  Result Value Ref Range Status   C Diff antigen NEGATIVE NEGATIVE Final   C Diff toxin NEGATIVE NEGATIVE Final   C Diff interpretation No C. difficile detected.  Final    Comment: Performed at [facility], [address], [place], [place] [address]  Expectorated Sputum Assessment  w Gram Stain, Rflx to Resp Cult     Status: None   Collection Time: 09/05/22  1:16 PM   Specimen: Expectorated Sputum  Result Value Ref Range Status   Specimen Description EXPECTORATED SPUTUM  Final   Special Requests NONE  Final   Sputum evaluation   Final    THIS SPECIMEN IS ACCEPTABLE FOR SPUTUM CULTURE Performed at [facility], [address] [place], [place] [address]    Report Status 09/05/2022 FINAL  Final  Culture, Respiratory w Gram Stain     Status: None (Preliminary result)   Collection Time: 09/05/22   1:16 PM  Result Value Ref Range Status   Specimen Description   Final    EXPECTORATED SPUTUM Performed at [facility], [address] [place], [place] [address]    Special Requests   Final    NONE Reflexed from [id number] Performed at [facility], [address] [place], [place] [address]    Gram Stain   Final    FEW SQUAMOUS EPITHELIAL CELLS PRESENT NO WBC SEEN ABUNDANT GRAM NEGATIVE RODS FEW GRAM POSITIVE COCCI    Culture   Final    CULTURE REINCUBATED FOR BETTER GROWTH Performed at [facility], [address] [place], [place] [address]    Report Status PENDING  Incomplete     Labs: Basic Metabolic Panel: Recent Labs  Lab 09/04/22 0609 09/05/22 0710 09/06/22 0322 09/07/22 0545  NA 136 134* 134* 134*  K 3.6 3.5 3.5 3.0*  CL 102 104 104 106  CO2 21* 22 22 23   GLUCOSE 131* 89 116* 107*  BUN 67* 30* 22* 17  CREATININE 3.45* 1.61* 1.36* 1.23  CALCIUM 8.1* 7.7* 7.7* 7.9*  MG  --   --   --  2.0   Liver Function Tests: Recent Labs  Lab 09/04/22 0609 09/05/22 0710 09/06/22 0322 09/07/22 0545  AST 34 31 26 28   ALT 36 27 27 31   ALKPHOS 101 88 102 82  BILITOT 5.2* 2.6* 1.8* 1.2  PROT 8.0 6.7 6.7 6.3*  ALBUMIN 3.5 2.9* 2.9* 2.8*   CBC: Recent Labs  Lab 09/04/22 0609 09/05/22 0255 09/06/22 0322 09/07/22 0545  WBC 42.5* 23.0* 16.1* 9.6  NEUTROABS 39.3* 19.6* 12.4* 4.8  HGB 12.5* 10.2* 9.8* 9.8*  HCT 37.7* 31.3* 30.2* 29.4*  MCV 102.7* 104.0* 103.8* 101.4*  PLT 130* 105* 130* 136*   CBG: No results for input(s): "GLUCAP" in the last 168 hours. Hgb A1c No results for input(s): "HGBA1C" in the last 72 hours. Lipid Profile No results for input(s): "CHOL", "HDL", "LDLCALC", "TRIG", "CHOLHDL", "LDLDIRECT" in the last 72 hours. Thyroid function studies No results for input(s): "TSH", "T4TOTAL", "T3FREE", "THYROIDAB" in the last 72 hours.  Invalid input(s): "FREET3" Urinalysis    Component Value Date/Time    COLORURINE AMBER (A) 09/04/2022 0609   APPEARANCEUR HAZY (A) 09/04/2022 0609   LABSPEC 1.023 09/04/2022 0609   PHURINE 5.0 09/04/2022 0609   GLUCOSEU NEGATIVE 09/04/2022 0609   HGBUR MODERATE (A) 09/04/2022 0609   BILIRUBINUR NEGATIVE 09/04/2022 0609   KETONESUR NEGATIVE 09/04/2022 0609   PROTEINUR 100 (A) 09/04/2022 0609   NITRITE NEGATIVE 09/04/2022 0609   LEUKOCYTESUR SMALL (A) 09/04/2022 0609    FURTHER DISCHARGE INSTRUCTIONS:   Get Medicines reviewed and adjusted: Please take all your medications with you for your next visit with your Primary MD   Laboratory/radiological data: Please request your Primary MD to go over all hospital tests and  procedure/radiological results at the follow up, please ask your Primary MD to get all Hospital records sent to his/her office.   In some cases, they will be blood work, cultures and biopsy results pending at the time of your discharge. Please request that your primary care M.D. goes through all the records of your hospital data and follows up on these results.   Also Note the following: If you experience worsening of your admission symptoms, develop shortness of breath, life threatening emergency, suicidal or homicidal thoughts you must seek medical attention immediately by calling 911 or calling your MD immediately  if symptoms less severe.   You must read complete instructions/literature along with all the possible adverse reactions/side effects for all the Medicines you take and that have been prescribed to you. Take any new Medicines after you have completely understood and accpet all the possible adverse reactions/side effects.    Do not drive when taking Pain medications or sleeping medications (Benzodaizepines)   Do not take more than prescribed Pain, Sleep and Anxiety Medications. It is not advisable to combine anxiety,sleep and pain medications without talking with your primary care practitioner   Special Instructions: If you have  smoked or chewed Tobacco  in the last 2 yrs please stop smoking, stop any regular Alcohol  and or any Recreational drug use.   Wear Seat belts while driving.   Please note: You were cared for by a hospitalist during your hospital stay. Once you are discharged, your primary care physician will handle any further medical issues. Please note that NO REFILLS for any discharge medications will be authorized once you are discharged, as it is imperative that you return to your primary care physician (or establish a relationship with a primary care physician if you do not have one) for your post hospital discharge needs so that they can reassess your need for medications and monitor your lab values.  Time coordinating discharge: 35 minutes  SIGNED:  Pamella Pert, MD, PhD 09/07/2022, 9:48 AM

## 2022-09-09 LAB — CULTURE, BLOOD (ROUTINE X 2): Culture: NO GROWTH

## 2022-09-25 ENCOUNTER — Other Ambulatory Visit (HOSPITAL_COMMUNITY): Payer: Self-pay

## 2022-09-25 ENCOUNTER — Other Ambulatory Visit: Payer: Self-pay

## 2022-09-27 ENCOUNTER — Other Ambulatory Visit: Payer: Self-pay | Admitting: Orthopedic Surgery

## 2022-10-17 ENCOUNTER — Other Ambulatory Visit (HOSPITAL_COMMUNITY): Payer: Self-pay

## 2022-10-24 ENCOUNTER — Other Ambulatory Visit (HOSPITAL_COMMUNITY): Payer: Self-pay

## 2022-10-25 ENCOUNTER — Other Ambulatory Visit: Payer: Self-pay | Admitting: Family

## 2022-11-15 ENCOUNTER — Other Ambulatory Visit (HOSPITAL_COMMUNITY): Payer: Self-pay

## 2022-11-15 ENCOUNTER — Other Ambulatory Visit: Payer: Self-pay

## 2022-11-16 ENCOUNTER — Other Ambulatory Visit: Payer: Self-pay

## 2022-11-16 ENCOUNTER — Other Ambulatory Visit (HOSPITAL_COMMUNITY): Payer: Self-pay

## 2022-11-23 ENCOUNTER — Other Ambulatory Visit: Payer: Self-pay | Admitting: Family

## 2022-12-10 ENCOUNTER — Other Ambulatory Visit: Payer: Self-pay | Admitting: Family

## 2022-12-11 ENCOUNTER — Telehealth: Payer: Self-pay | Admitting: Orthopedic Surgery

## 2022-12-11 NOTE — Telephone Encounter (Signed)
Patient called. He would like a refill on prednisone.

## 2022-12-11 NOTE — Telephone Encounter (Signed)
Pt eval in the office 05/2022 for lichen simplex chronicus. Requesting refill of prednisone.

## 2022-12-12 ENCOUNTER — Other Ambulatory Visit: Payer: Self-pay | Admitting: Infectious Diseases

## 2022-12-12 ENCOUNTER — Other Ambulatory Visit (HOSPITAL_COMMUNITY): Payer: Self-pay

## 2022-12-12 ENCOUNTER — Other Ambulatory Visit: Payer: Self-pay

## 2022-12-12 DIAGNOSIS — B20 Human immunodeficiency virus [HIV] disease: Secondary | ICD-10-CM

## 2022-12-12 MED ORDER — BIKTARVY 50-200-25 MG PO TABS
1.0000 | ORAL_TABLET | Freq: Every day | ORAL | 0 refills | Status: DC
Start: 2022-12-12 — End: 2022-12-18
  Filled 2022-12-12: qty 30, 30d supply, fill #0

## 2022-12-12 NOTE — Telephone Encounter (Signed)
Can you please call pt and make an appt with Dr. Lajoyce Corners next available spot please? Thank you!

## 2022-12-15 ENCOUNTER — Other Ambulatory Visit (HOSPITAL_COMMUNITY): Payer: Self-pay

## 2022-12-18 ENCOUNTER — Other Ambulatory Visit: Payer: Self-pay

## 2022-12-18 ENCOUNTER — Ambulatory Visit (INDEPENDENT_AMBULATORY_CARE_PROVIDER_SITE_OTHER): Payer: BC Managed Care – PPO | Admitting: Infectious Diseases

## 2022-12-18 ENCOUNTER — Encounter: Payer: Self-pay | Admitting: Infectious Diseases

## 2022-12-18 ENCOUNTER — Other Ambulatory Visit (HOSPITAL_COMMUNITY)
Admission: RE | Admit: 2022-12-18 | Discharge: 2022-12-18 | Disposition: A | Discharge status: Home / Self Care | Payer: BC Managed Care – PPO | Source: Ambulatory Visit | Attending: Infectious Diseases | Admitting: Infectious Diseases

## 2022-12-18 ENCOUNTER — Other Ambulatory Visit (HOSPITAL_COMMUNITY): Payer: Self-pay

## 2022-12-18 VITALS — BP 125/81 | HR 73 | Temp 98.2°F | Ht 72.0 in | Wt 215.0 lb

## 2022-12-18 DIAGNOSIS — B20 Human immunodeficiency virus [HIV] disease: Secondary | ICD-10-CM | POA: Diagnosis not present

## 2022-12-18 DIAGNOSIS — B957 Other staphylococcus as the cause of diseases classified elsewhere: Secondary | ICD-10-CM

## 2022-12-18 DIAGNOSIS — L0103 Bullous impetigo: Secondary | ICD-10-CM

## 2022-12-18 MED ORDER — MUPIROCIN 2 % EX OINT
1.0000 | TOPICAL_OINTMENT | Freq: Three times a day (TID) | CUTANEOUS | 5 refills | Status: AC
Start: 2022-12-18 — End: ?

## 2022-12-18 MED ORDER — DOXYCYCLINE HYCLATE 100 MG PO TABS
100.0000 mg | ORAL_TABLET | Freq: Two times a day (BID) | ORAL | 0 refills | Status: AC
Start: 2022-12-18 — End: 2022-12-25

## 2022-12-18 MED ORDER — ROSUVASTATIN CALCIUM 10 MG PO TABS: 10.0000 mg | ORAL_TABLET | Freq: Every day | ORAL | 5 refills | Status: DC

## 2022-12-18 MED ORDER — BIKTARVY 50-200-25 MG PO TABS
1.0000 | ORAL_TABLET | Freq: Every day | ORAL | 11 refills | Status: DC
Start: 2022-12-18 — End: 2023-07-23
  Filled 2022-12-18 – 2023-01-04 (×2): qty 30, 30d supply, fill #0
  Filled 2023-02-05: qty 30, 30d supply, fill #1
  Filled 2023-03-22: qty 30, 30d supply, fill #2
  Filled 2023-04-16: qty 30, 30d supply, fill #3
  Filled 2023-05-14: qty 30, 30d supply, fill #4
  Filled 2023-06-08: qty 30, 30d supply, fill #5
  Filled 2023-07-09: qty 30, 30d supply, fill #6

## 2022-12-18 NOTE — Patient Instructions (Addendum)
Refills have been sent in for your biktarvy.   Come back and see me sometime in the next few months at your ability so we can do anal cancer screening  START your cholesterol medication once a day with your biktarvy. This will help to reduce heart disease risk for you.    Will start you on oral antibiotic for a week - will also send in a topical antibiotic cream called Mupirocin. Apply this three times a day for a week when you notice some of these skin changes coming on. I suspect this is due to a bacterial infection called staph.   DOXYCYCLINE Instructions:  Take twice a day with food (breakfast and dinner) Make sure you drink a full 8 oz glass of water with each dose Sit upright for 1 hour after to prevent heartburn Careful in the sun - can make you more at risk for sun burns (bad ones...) Limit sun exposure between 10-4 Sun block! Don't forget to reapply it Hats Longer sleeves/pants

## 2022-12-18 NOTE — Progress Notes (Signed)
Subjective:  Patient ID: David [name], male    DOB: February 20, 1966  Age: 57 y.o. MRN: [id number]  CC:  Chief Complaint  Patient presents with   Follow-up     HPI David [name] presents for routine HIV follow up care.  LOV 11/2021 - doing well no trouble at that time. Doing well on biktarvy with last VL < 20 and CD4 520.  Doing well on biktarvy once daily without any side effects. No access problems. Not currently sexually active at present. Doing well on medication overall. Getting sent from [facility]. Needs a refill.   Working with Dr. Lajoyce Corners for lichen simplex chronicus - he has new purulent bullous lesions on bilateral knees in multiple areas that smell foul. No systemic symptoms. No joint involvement. He has had this problem come up periodically and hopeful for a topical treatment to help correct them.   Polycystic liver disease on liver MR in April during food poisoning hospital encounter.  No family history of heart disease to his knowledge.   H/o anaphylactic reaction to pcns.    Past Medical History:  Diagnosis Date   Asthma    History of syphilis 06/13/2021   Treated 4 weeks doxycycline January 2022 for titer 1:128   HIV (human immunodeficiency virus infection) (HCC)     Social History   Socioeconomic History   Marital status: Single    Spouse name: Not on file   Number of children: Not on file   Years of education: Not on file   Highest education level: Not on file  Occupational History   Not on file  Tobacco Use   Smoking status: Every Day    Current packs/day: 0.30    Types: Cigarettes   Smokeless tobacco: Never  Vaping Use   Vaping status: Never Used  Substance and Sexual Activity   Alcohol use: No   Drug use: No   Sexual activity: Not on file  Other Topics Concern   Not on file  Social History Narrative   ** Merged History Encounter **       Social Determinants of Health   Financial Resource Strain: Not on file  Food Insecurity: No Food  Insecurity (09/04/2022)   Hunger Vital Sign    Worried About Running Out of Food in the Last Year: Never true    Ran Out of Food in the Last Year: Never true  Transportation Needs: No Transportation Needs (09/04/2022)   PRAPARE - Administrator, Civil Service (Medical): No    Lack of Transportation (Non-Medical): No  Physical Activity: Not on file  Stress: Not on file  Social Connections: Not on file  Intimate Partner Violence: Not At Risk (09/04/2022)   Humiliation, Afraid, Rape, and Kick questionnaire    Fear of Current or Ex-Partner: No    Emotionally Abused: No    Physically Abused: No    Sexually Abused: No    Outpatient Medications Prior to Visit  Medication Sig Dispense Refill   albuterol (PROVENTIL) (2.5 MG/3ML) 0.083% nebulizer solution Take 3 mLs (2.5 mg total) by nebulization every 6 (six) hours as needed for wheezing or shortness of breath. 75 mL 12   albuterol (VENTOLIN HFA) 108 (90 Base) MCG/ACT inhaler Inhale 2 puffs into the lungs every 6 (six) hours as needed for wheezing or shortness of breath. 18 g 2   ascorbic acid (VITAMIN C) 500 MG tablet Take 1 tablet (500 mg total) by mouth daily. 90 tablet 0  folic acid (FOLVITE) 1 MG tablet Take 1 tablet (1 mg total) by mouth daily. 30 tablet 0   predniSONE (DELTASONE) 10 MG tablet TAKE 1 TABLET (10 MG TOTAL) BY MOUTH DAILY WITH BREAKFAST. 30 tablet 0   zinc sulfate 220 (50 Zn) MG capsule Take 1 capsule (220 mg total) by mouth daily. 90 capsule 0   bictegravir-emtricitabine-tenofovir AF (BIKTARVY) 50-200-25 MG TABS tablet Take 1 tablet by mouth daily. 30 tablet 0   No facility-administered medications prior to visit.    Allergies  Allergen Reactions   Penicillins Swelling    Tolerated cefepime April 2024    ROS Review of Systems  Constitutional:  Negative for chills and fever.  All other systems reviewed and are negative.     Objective:    Vitals:   12/18/22 1343  BP: 125/81  Pulse: 73  Temp: 98.2 F  (36.8 C)  SpO2: 98%    Body mass index is 29.16 kg/m.   Physical Exam Constitutional:      Appearance: Normal appearance. He is not ill-appearing.  HENT:     Head: Normocephalic.     Mouth/Throat:     Mouth: Mucous membranes are moist.     Pharynx: Oropharynx is clear.  Eyes:     General: No scleral icterus. Cardiovascular:     Rate and Rhythm: Normal rate.  Pulmonary:     Effort: Pulmonary effort is normal.  Musculoskeletal:        General: Normal range of motion.     Cervical back: Normal range of motion.  Skin:    Coloration: Skin is not jaundiced or pale.     Comments: Purulent, foul smelling bullae on right knee x 3-4 spots.   Neurological:     Mental Status: He is alert and oriented to person, place, and time.  Psychiatric:        Mood and Affect: Mood normal.        Judgment: Judgment normal.     BP 125/81   Pulse 73   Temp 98.2 F (36.8 C) (Oral)   Ht 6' (1.829 m)   Wt 215 lb (97.5 kg)   SpO2 98%   BMI 29.16 kg/m   Wt Readings from Last 3 Encounters:  12/18/22 215 lb (97.5 kg)  09/04/22 218 lb 0.6 oz (98.9 kg)  06/10/22 218 lb (98.9 kg)     Health Maintenance Due  Topic Date Due   DTaP/Tdap/Td (1 - Tdap) Never done   Zoster Vaccines- Shingrix (1 of 2) Never done   Colonoscopy  Never done   COVID-19 Vaccine (4 - 2023-24 season) 01/27/2022    There are no preventive care reminders to display for this patient.  No results found for: "TSH" Lab Results  Component Value Date   WBC 9.6 09/07/2022   HGB 9.8 (L) 09/07/2022   HCT 29.4 (L) 09/07/2022   MCV 101.4 (H) 09/07/2022   PLT 136 (L) 09/07/2022   Lab Results  Component Value Date   NA 134 (L) 09/07/2022   K 3.0 (L) 09/07/2022   CO2 23 09/07/2022   GLUCOSE 107 (H) 09/07/2022   BUN 17 09/07/2022   CREATININE 1.23 09/07/2022   BILITOT 1.2 09/07/2022   ALKPHOS 82 09/07/2022   AST 28 09/07/2022   ALT 31 09/07/2022   PROT 6.3 (L) 09/07/2022   ALBUMIN 2.8 (L) 09/07/2022   CALCIUM 7.9  (L) 09/07/2022   ANIONGAP 5 09/07/2022   EGFR 62 06/13/2021   Lab Results  Component  Value Date   CHOL 172 06/10/2020   Lab Results  Component Value Date   HDL 41 06/10/2020   Lab Results  Component Value Date   LDLCALC 111 (H) 06/10/2020   Lab Results  Component Value Date   TRIG 92 06/10/2020   Lab Results  Component Value Date   CHOLHDL 4.2 06/10/2020   No results found for: "HGBA1C"    Assessment & Plan:   Problem List Items Addressed This Visit       Unprioritized   Bullous staphylococcal impetigo    Most c/w bullous impeteigo - likely staph infection. I was able to aspirate purulent material from the right knee bullae x 2 and sent for culture.  Start doxycycline BID x 7d. Topical mupirocin 3x a day when this flares again to have to start on demand. He was not seen to be MRSA colonized at hospital stay in April.       Relevant Medications   mupirocin ointment (BACTROBAN) 2 %   doxycycline (VIBRA-TABS) 100 MG tablet   bictegravir-emtricitabine-tenofovir AF (BIKTARVY) 50-200-25 MG TABS tablet   Other Relevant Orders   WOUND CULTURE   HIV disease (HCC) - Primary    Very well controlled on once daily Biktarvy. No concerns with access or adherence to medication. They are tolerating the medication well without side effects. No drug interactions identified. Pertinent lab tests ordered today.  No changes to insurance coverage.  No dental needs today.  No concern over anxious/depressed mood.  Sexual health and family planning discussed - asymptomatic screening ordered.  Anorectal cancer screenings discussed - will come back to see me in 69m to perform screening and FU lipids.   With new research coming out by way of the REPRIEVE study regarding cardiovascular event risk reduction for PLWH, I discussed that over the 8 year trial initiation of statin (pitavastatin) therapy was shown to reduce CV disease by 35% independent of other risk factors.    The 10-year ASCVD risk  score (Arnett DK, et al., 2019) is: 11.9%   Values used to calculate the score:     Age: 51 years     Sex: Male     Is Non-Hispanic African American: Yes     Diabetic: No     Tobacco smoker: Yes     Systolic Blood Pressure: 125 mmHg     Is BP treated: No     HDL Cholesterol: 41 mg/dL     Total Cholesterol: 172 mg/dL   We discussed the patient's 10-year CVD Risk Score to be at least moderately elevated, and would benefit from intervention given HIV+ and > 6 yo.   Will proceed with crestor 10 mg daily with follow up in 50m.   Return in about 3 months (around 03/20/2023).        Relevant Medications   mupirocin ointment (BACTROBAN) 2 %   bictegravir-emtricitabine-tenofovir AF (BIKTARVY) 50-200-25 MG TABS tablet   Other Relevant Orders   HIV 1 RNA quant-no reflex-bld   T-helper cells (CD4) count   RPR   COMPLETE METABOLIC PANEL WITH GFR   Urine cytology ancillary only     Rexene Alberts, MSN, NP-C [facility]  [email or web address] Pager: [phone] Office: [phone] [facility] Main Line: [phone] *Secure Chat Communication Welcome

## 2022-12-18 NOTE — Assessment & Plan Note (Addendum)
Most c/w bullous impeteigo - likely staph infection. I was able to aspirate purulent material from the right knee bullae x 2 and sent for culture.  Start doxycycline BID x 7d. Topical mupirocin 3x a day when this flares again to have to start on demand. He was not seen to be MRSA colonized at hospital stay in April.

## 2022-12-18 NOTE — Assessment & Plan Note (Signed)
Very well controlled on once daily Biktarvy. No concerns with access or adherence to medication. They are tolerating the medication well without side effects. No drug interactions identified. Pertinent lab tests ordered today.  No changes to insurance coverage.  No dental needs today.  No concern over anxious/depressed mood.  Sexual health and family planning discussed - asymptomatic screening ordered.  Anorectal cancer screenings discussed - will come back to see me in 38m to perform screening and FU lipids.   With new research coming out by way of the REPRIEVE study regarding cardiovascular event risk reduction for PLWH, I discussed that over the 8 year trial initiation of statin (pitavastatin) therapy was shown to reduce CV disease by 35% independent of other risk factors.    The 10-year ASCVD risk score (Arnett DK, et al., 2019) is: 11.9%   Values used to calculate the score:     Age: 57 years     Sex: Male     Is Non-Hispanic African American: Yes     Diabetic: No     Tobacco smoker: Yes     Systolic Blood Pressure: 125 mmHg     Is BP treated: No     HDL Cholesterol: 41 mg/dL     Total Cholesterol: 172 mg/dL   We discussed the patient's 10-year CVD Risk Score to be at least moderately elevated, and would benefit from intervention given HIV+ and > 21 yo.   Will proceed with crestor 10 mg daily with follow up in 70m.   Return in about 3 months (around 03/20/2023).

## 2022-12-19 ENCOUNTER — Telehealth: Payer: Self-pay

## 2022-12-19 ENCOUNTER — Other Ambulatory Visit (HOSPITAL_COMMUNITY): Payer: Self-pay

## 2022-12-19 LAB — WOUND CULTURE: SPECIMEN QUALITY:: ADEQUATE

## 2022-12-19 LAB — COMPLETE METABOLIC PANEL WITH GFR
AG Ratio: 1.1 (calc) (ref 1.0–2.5)
ALT: 11 U/L (ref 9–46)
AST: 14 U/L (ref 10–35)
Albumin: 3.6 g/dL (ref 3.6–5.1)
Alkaline phosphatase (APISO): 99 U/L (ref 35–144)
BUN/Creatinine Ratio: 14 (calc) (ref 6–22)
BUN: 21 mg/dL (ref 7–25)
CO2: 24 mmol/L (ref 20–32)
Calcium: 9 mg/dL (ref 8.6–10.3)
Chloride: 109 mmol/L (ref 98–110)
Creat: 1.49 mg/dL — ABNORMAL HIGH (ref 0.70–1.30)
Globulin: 3.4 g/dL (calc) (ref 1.9–3.7)
Glucose, Bld: 63 mg/dL — ABNORMAL LOW (ref 65–99)
Potassium: 4 mmol/L (ref 3.5–5.3)
Sodium: 142 mmol/L (ref 135–146)
Total Bilirubin: 0.6 mg/dL (ref 0.2–1.2)
Total Protein: 7 g/dL (ref 6.1–8.1)
eGFR: 54 mL/min/{1.73_m2} — ABNORMAL LOW (ref >=60)

## 2022-12-19 LAB — RPR TITER: RPR Titer: 1:32 {titer} — ABNORMAL HIGH

## 2022-12-19 LAB — T-HELPER CELLS (CD4) COUNT (NOT AT ARMC)
CD4 % Helper T Cell: 41 % (ref 33–65)
CD4 T Cell Abs: 856 /uL (ref 400–1790)

## 2022-12-19 LAB — RPR: RPR Ser Ql: REACTIVE — AB

## 2022-12-19 MED ORDER — LEVOFLOXACIN 500 MG PO TABS: 500.0000 mg | ORAL_TABLET | Freq: Every day | ORAL | 0 refills | Status: DC

## 2022-12-19 NOTE — Telephone Encounter (Signed)
-----   Message from Hilda sent at 12/19/2022  2:02 PM EDT ----- I have a early update from the cultures  - We need to add a second antibiotic for him - can we do levaquin 500 mg every day for him in addition to the doxycycline.  Thank you

## 2022-12-19 NOTE — Addendum Note (Signed)
Addended by: Blanchard Kelch on: 12/19/2022 02:04 PM   Modules accepted: Orders

## 2022-12-19 NOTE — Telephone Encounter (Signed)
Patient is aware of prescription sent in to [facility]. Patient verbalized understanding and had no further questions.

## 2022-12-20 LAB — URINE CYTOLOGY ANCILLARY ONLY
Chlamydia: NEGATIVE
Comment: NEGATIVE
Comment: NORMAL
Neisseria Gonorrhea: NEGATIVE

## 2022-12-20 LAB — WOUND CULTURE: MICRO NUMBER:: 15231047

## 2022-12-20 LAB — HIV-1 RNA QUANT-NO REFLEX-BLD
HIV 1 RNA Quant: NOT DETECTED Copies/mL
HIV-1 RNA Quant, Log: NOT DETECTED Log cps/mL

## 2022-12-20 LAB — T PALLIDUM AB: T Pallidum Abs: POSITIVE — AB

## 2022-12-25 ENCOUNTER — Ambulatory Visit (INDEPENDENT_AMBULATORY_CARE_PROVIDER_SITE_OTHER): Payer: BC Managed Care – PPO | Admitting: Orthopedic Surgery

## 2022-12-25 DIAGNOSIS — L280 Lichen simplex chronicus: Secondary | ICD-10-CM | POA: Diagnosis not present

## 2022-12-25 DIAGNOSIS — L309 Dermatitis, unspecified: Secondary | ICD-10-CM | POA: Diagnosis not present

## 2022-12-25 MED ORDER — PREDNISONE 10 MG PO TABS: 10.0000 mg | ORAL_TABLET | Freq: Every day | ORAL | 0 refills | Status: DC

## 2023-01-04 ENCOUNTER — Other Ambulatory Visit: Payer: Self-pay

## 2023-01-04 ENCOUNTER — Other Ambulatory Visit (HOSPITAL_COMMUNITY): Payer: Self-pay

## 2023-01-08 ENCOUNTER — Encounter: Payer: Self-pay | Admitting: Orthopedic Surgery

## 2023-01-08 NOTE — Progress Notes (Signed)
Office Visit Note   Patient: David [name]           Date of Birth: May 04, 1966           MRN: [id number] Visit Date: 12/25/2022              Requested by: No referring provider defined for this encounter. PCP: Pcp, No  Chief Complaint  Patient presents with   Left Leg - Follow-up   Right Leg - Follow-up      HPI: Patient is a 57 year old gentleman who presents in follow-up for bilateral chronic dermatitis both feet.  Patient has had good relief with oral prednisone.  Patient does have follow-up with infectious disease.  Assessment & Plan: Visit Diagnoses:  1. Lichen simplex chronicus   2. Dermatitis of both feet     Plan: Patient's dermatitis continues to improve.  He was given a Crue sock.  Prescription for prednisone provided.  Follow-Up Instructions: Return in about 4 weeks (around 01/22/2023).   Ortho Exam  Patient is alert, oriented, no adenopathy, well-dressed, normal affect, normal respiratory effort. Examination the dermatitis to his feet continues to improve.  There is no cellulitis no drainage no signs of infection.  Imaging: No results found. No images are attached to the encounter.  Labs: Lab Results  Component Value Date   ESRSEDRATE 118 (H) 09/05/2022   ESRSEDRATE 113 (H) 05/04/2022   CRP 11.6 (H) 09/05/2022   CRP 3.3 05/04/2022   CRP 0.9 05/22/2020   LABURIC 7.0 05/04/2022   REPTSTATUS 09/05/2022 FINAL 09/05/2022   REPTSTATUS 09/07/2022 FINAL 09/05/2022   GRAMSTAIN  09/05/2022    FEW SQUAMOUS EPITHELIAL CELLS PRESENT NO WBC SEEN ABUNDANT GRAM NEGATIVE RODS FEW GRAM POSITIVE COCCI    CULT  09/05/2022    FEW Normal respiratory flora-no Staph aureus or Pseudomonas seen Performed at [facility], [address], [place], [place] [address]      Lab Results  Component Value Date   ALBUMIN 2.8 (L) 09/07/2022   ALBUMIN 2.9 (L) 09/06/2022   ALBUMIN 2.9 (L) 09/05/2022    Lab Results  Component Value Date   MG 2.0 09/07/2022    MG 1.7 03/20/2021   MG 1.9 03/19/2021   No results found for: "VD25OH"  No results found for: "PREALBUMIN"    Latest Ref Rng & Units 09/07/2022    5:45 AM 09/06/2022    3:22 AM 09/05/2022   12:36 PM  CBC EXTENDED  WBC 4.0 - 10.5 K/uL 9.6  16.1    RBC 4.22 - 5.81 MIL/uL 2.90  2.91  3.19   Hemoglobin 13.0 - 17.0 g/dL 9.8  9.8    HCT 62.1 - 52.0 % 29.4  30.2    Platelets 150 - 400 K/uL 136  130    NEUT# 1.7 - 7.7 K/uL 4.8  12.4    Lymph# 0.7 - 4.0 K/uL 2.6  2.2       There is no height or weight on file to calculate BMI.  Orders:  No orders of the defined types were placed in this encounter.  Meds ordered this encounter  Medications   predniSONE (DELTASONE) 10 MG tablet    Sig: Take 1 tablet (10 mg total) by mouth daily with breakfast.    Dispense:  30 tablet    Refill:  0     Procedures: No procedures performed  Clinical Data: No additional findings.  ROS:  All other systems negative, except as noted in the HPI. Review  of Systems  Objective: Vital Signs: There were no vitals taken for this visit.  Specialty Comments:  No specialty comments available.  PMFS History: Patient Active Problem List   Diagnosis Date Noted   Bullous staphylococcal impetigo 12/18/2022   Emphysema due to alpha-1-antitrypsin deficiency (HCC) 09/04/2022   Hyperbilirubinemia 09/04/2022   AKI (acute kidney injury) (HCC) 09/04/2022   Positive D dimer 09/04/2022   Thrombocytopenia (HCC) 09/04/2022   Leukocytosis 09/04/2022   Macrocytic anemia 09/04/2022   Hypocalcemia 09/04/2022   Liver lesion 09/04/2022   Hypertension 12/19/2021   History of syphilis 06/13/2021   Elevated serum creatinine 03/18/2021   HIV disease (HCC) 06/10/2020   Screening for STDs (sexually transmitted diseases) 06/10/2020   Immunization counseling 06/10/2020   Asthma in adult, mild persistent, with acute exacerbation 05/22/2020   Asthma 05/22/2020   Past Medical History:  Diagnosis Date   Asthma    History  of syphilis 06/13/2021   Treated 4 weeks doxycycline January 2022 for titer 1:128   HIV (human immunodeficiency virus infection) (HCC)     Family History  Problem Relation Age of Onset   Cancer Mother     History reviewed. No pertinent surgical history. Social History   Occupational History   Not on file  Tobacco Use   Smoking status: Every Day    Current packs/day: 0.30    Types: Cigarettes   Smokeless tobacco: Never  Vaping Use   Vaping status: Never Used  Substance and Sexual Activity   Alcohol use: No   Drug use: No   Sexual activity: Not on file

## 2023-01-12 ENCOUNTER — Other Ambulatory Visit (HOSPITAL_COMMUNITY): Payer: Self-pay

## 2023-01-15 ENCOUNTER — Other Ambulatory Visit (HOSPITAL_COMMUNITY): Payer: Self-pay

## 2023-01-15 ENCOUNTER — Ambulatory Visit: Payer: BC Managed Care – PPO | Admitting: Orthopedic Surgery

## 2023-01-21 ENCOUNTER — Other Ambulatory Visit: Payer: Self-pay | Admitting: Orthopedic Surgery

## 2023-02-05 ENCOUNTER — Ambulatory Visit: Payer: BC Managed Care – PPO | Admitting: Orthopedic Surgery

## 2023-02-05 ENCOUNTER — Other Ambulatory Visit (HOSPITAL_COMMUNITY): Payer: Self-pay

## 2023-02-26 ENCOUNTER — Other Ambulatory Visit: Payer: Self-pay | Admitting: Family

## 2023-03-09 ENCOUNTER — Other Ambulatory Visit (HOSPITAL_COMMUNITY): Payer: Self-pay

## 2023-03-12 ENCOUNTER — Other Ambulatory Visit (HOSPITAL_COMMUNITY): Payer: Self-pay

## 2023-03-14 ENCOUNTER — Other Ambulatory Visit (HOSPITAL_COMMUNITY): Payer: Self-pay

## 2023-03-22 ENCOUNTER — Other Ambulatory Visit (HOSPITAL_COMMUNITY): Payer: Self-pay

## 2023-03-22 ENCOUNTER — Other Ambulatory Visit: Payer: Self-pay

## 2023-03-22 ENCOUNTER — Other Ambulatory Visit: Payer: Self-pay | Admitting: Infectious Diseases

## 2023-03-22 MED ORDER — ALBUTEROL SULFATE HFA 108 (90 BASE) MCG/ACT IN AERS
2.0000 | INHALATION_SPRAY | Freq: Four times a day (QID) | RESPIRATORY_TRACT | 5 refills | Status: DC | PRN
  Filled 2023-03-22: qty 6.7, 25d supply, fill #0
  Filled 2023-04-16: qty 6.7, 25d supply, fill #1
  Filled 2023-05-14: qty 6.7, 25d supply, fill #2
  Filled 2023-06-08: qty 6.7, 25d supply, fill #3
  Filled 2023-07-09: qty 6.7, 25d supply, fill #4
  Filled 2023-08-09 (×2): qty 6.7, 25d supply, fill #5

## 2023-03-22 NOTE — Telephone Encounter (Signed)
Okay to refill? 

## 2023-03-22 NOTE — Progress Notes (Signed)
Specialty Pharmacy Ongoing Clinical Assessment Note  David [name] is a 57 y.o. male who is being followed by the specialty pharmacy service for RxSp HIV   Patient's specialty medication(s) reviewed today: Bictegravir-Emtricitab-Tenofov   Missed doses in the last 4 weeks: 0   Patient/Caregiver asked additional questions regarding his Biktarvy.  Pt reported that he has had increased urination the past few weeks. He asked if it related to [facility]. Pt has been on Biktarvy for a long time without this compliant. Per Up to date, it does not look like Biktarvy would contribute to increased urination. Pt reports taking Alka-Seltzer Plus and Benadry for a cold, and not increasing water intake or decreasing salt intake. Recommended to have it check out by his provider.  Therapeutic benefit summary: Patient is achieving benefit   Adverse events/side effects summary: No adverse events/side effects   Patient's therapy is appropriate to: Continue    Goals Addressed             This Visit's Progress    Achieve Undetectable HIV Viral Load < 20       Patient is on track. Patient will maintain adherence         Follow up:  6 months  Bobette Mo Specialty Pharmacist

## 2023-03-22 NOTE — Progress Notes (Signed)
Specialty Pharmacy Refill Coordination Note  David [name] is a 57 y.o. male contacted today regarding refills of specialty medication(s) Bictegravir-Emtricitab-Tenofov   Patient requested Delivery   Delivery date: 03/26/23   Verified address: [address]   Medication will be filled on 03/23/23.

## 2023-03-23 ENCOUNTER — Other Ambulatory Visit (HOSPITAL_COMMUNITY): Payer: Self-pay

## 2023-03-23 ENCOUNTER — Other Ambulatory Visit: Payer: Self-pay

## 2023-03-25 ENCOUNTER — Other Ambulatory Visit: Payer: Self-pay | Admitting: Orthopedic Surgery

## 2023-04-13 ENCOUNTER — Other Ambulatory Visit: Payer: Self-pay

## 2023-04-13 ENCOUNTER — Emergency Department (HOSPITAL_COMMUNITY)
Admission: EM | Admit: 2023-04-13 | Discharge: 2023-04-13 | Disposition: A | Discharge status: Home / Self Care | Payer: BC Managed Care – PPO | Attending: Emergency Medicine | Admitting: Emergency Medicine

## 2023-04-13 ENCOUNTER — Encounter (HOSPITAL_COMMUNITY): Payer: Self-pay | Admitting: Emergency Medicine

## 2023-04-13 DIAGNOSIS — Z79899 Other long term (current) drug therapy: Secondary | ICD-10-CM | POA: Diagnosis not present

## 2023-04-13 DIAGNOSIS — Z21 Asymptomatic human immunodeficiency virus [HIV] infection status: Secondary | ICD-10-CM | POA: Diagnosis not present

## 2023-04-13 DIAGNOSIS — R0602 Shortness of breath: Secondary | ICD-10-CM | POA: Diagnosis not present

## 2023-04-13 DIAGNOSIS — I10 Essential (primary) hypertension: Secondary | ICD-10-CM | POA: Diagnosis not present

## 2023-04-13 DIAGNOSIS — J4520 Mild intermittent asthma, uncomplicated: Secondary | ICD-10-CM | POA: Diagnosis not present

## 2023-04-13 NOTE — ED Triage Notes (Signed)
Patient arrives ambulatory by POV states while at work this morning had an asthma attack. States he took his inhaler and prednisone then felt much better. Work called ems and had him evaluated. Patient reports feeling much better however work made him come to be checked out. Patient in NAD. Speaking in full sentences.

## 2023-04-13 NOTE — ED Provider Notes (Signed)
[place] EMERGENCY DEPARTMENT AT [facility] Provider Note   CSN: [id number] Arrival date & time: 04/13/23  1128     History  Chief Complaint  Patient presents with   Asthma    David [name] is a 57 y.o. male.  The history is provided by the patient and medical records. No language interpreter was used.  Asthma     57 year old male with significant history of HIV, syphilis, hypertension, emphysema, asthma presenting with complaints of shortness of breath.  Patient states he was at work today when someone was wearing a perfume that was a bit strong and he felt short of breath when he was around it.  He then took his rescue inhaler as well as his maintenance prednisone 10 mg that he took daily.  Initially he was having trouble with but shortly after having his medication he feels much better.  At this time he is back to his normal self.  EMS did arrive and since incident happened at work his will place request for patient to be evaluated in the ED.  Patient states he did not have any recent sickness leading up to this event.  No history of PE DVT and denies any other complaint.  He feels comfortable going home.  Patient mention that he uses rescue inhaler only as needed sometimes upward to twice a month  Home Medications Prior to Admission medications   Medication Sig Start Date End Date Taking? Authorizing Provider  albuterol (PROVENTIL) (2.5 MG/3ML) 0.083% nebulizer solution Take 3 mLs (2.5 mg total) by nebulization every 6 (six) hours as needed for wheezing or shortness of breath. 06/10/22   Concha Se, MD  albuterol (VENTOLIN HFA) 108 (90 Base) MCG/ACT inhaler Inhale 2 puffs into the lungs every 6 (six) hours as needed for wheezing or shortness of breath. 03/22/23   Blanchard Kelch, NP  ascorbic acid (VITAMIN C) 500 MG tablet Take 1 tablet (500 mg total) by mouth daily. 05/23/20   Arnetha Courser, MD  bictegravir-emtricitabine-tenofovir AF (BIKTARVY) 50-200-25  MG TABS tablet Take 1 tablet by mouth daily. 12/18/22   Blanchard Kelch, NP  folic acid (FOLVITE) 1 MG tablet Take 1 tablet (1 mg total) by mouth daily. 09/08/22   Leatha Gilding, MD  levofloxacin (LEVAQUIN) 500 MG tablet Take 1 tablet (500 mg total) by mouth daily. 12/19/22   Blanchard Kelch, NP  mupirocin ointment (BACTROBAN) 2 % Apply 1 Application topically 3 (three) times daily. 12/18/22   Blanchard Kelch, NP  predniSONE (DELTASONE) 10 MG tablet TAKE 1 TABLET (10 MG TOTAL) BY MOUTH DAILY WITH BREAKFAST. 03/26/23   Nadara Mustard, MD  rosuvastatin (CRESTOR) 10 MG tablet Take 1 tablet (10 mg total) by mouth daily. 12/18/22   Blanchard Kelch, NP  zinc sulfate 220 (50 Zn) MG capsule Take 1 capsule (220 mg total) by mouth daily. 05/23/20   Arnetha Courser, MD      Allergies    Penicillins    Review of Systems   Review of Systems  All other systems reviewed and are negative.   Physical Exam Updated Vital Signs BP (!) 165/112   Pulse 87   Temp 98 F (36.7 C) (Oral)   Resp 16   Ht 6' (1.829 m)   Wt 97.5 kg   SpO2 99%   BMI 29.16 kg/m  Physical Exam Vitals and nursing note reviewed.  Constitutional:      General: He is not in acute distress.  Appearance: He is well-developed.  HENT:     Head: Atraumatic.  Eyes:     Conjunctiva/sclera: Conjunctivae normal.  Cardiovascular:     Rate and Rhythm: Normal rate and regular rhythm.     Pulses: Normal pulses.     Heart sounds: Normal heart sounds.  Pulmonary:     Effort: Pulmonary effort is normal.     Breath sounds: Normal breath sounds. No wheezing, rhonchi or rales.  Abdominal:     Palpations: Abdomen is soft.     Tenderness: There is no abdominal tenderness.  Musculoskeletal:     Cervical back: Neck supple.  Skin:    Findings: No rash.  Neurological:     Mental Status: He is alert.     ED Results / Procedures / Treatments   Labs (all labs ordered are listed, but only abnormal results are displayed) Labs  Reviewed - No data to display  EKG None  Radiology No results found.  Procedures Procedures    Medications Ordered in ED Medications - No data to display  ED Course/ Medical Decision Making/ A&P                                 Medical Decision Making  BP (!) 165/112   Pulse 87   Temp 98 F (36.7 C) (Oral)   Resp 16   Ht 6' (1.829 m)   Wt 97.5 kg   SpO2 99%   BMI 29.16 kg/m   49:55 PM  57 year old male with significant history of HIV, syphilis, hypertension, emphysema, asthma presenting with complaints of shortness of breath.  Patient states he was at work today when someone was wearing a perfume that was a bit strong and he felt short of breath when he was around it.  He then took his rescue inhaler as well as his maintenance prednisone 10 mg that he took daily.  Initially he was having trouble with but shortly after having his medication he feels much better.  At this time he is back to his normal self.  EMS did arrive and since incident happened at work his will place request for patient to be evaluated in the ED.  Patient states he did not have any recent sickness leading up to this event.  No history of PE DVT and denies any other complaint.  He feels comfortable going home.  Patient mention that he uses rescue inhaler only as needed sometimes upward to twice a month  Exam overall reassuring patient is sitting comfortably in the chair appears to be in no acute discomfort.  Heart with normal rate and rhythm, lungs are clear to auscultation bilaterally and there is no wheezes and patient without any peripheral edema.  DDx: Asthma exacerbation, reactive airway disease, COPD, pneumonia, PE  At this time I felt patient is stable to be discharged.  Chest x-ray considered but not performed as patient without any infectious symptoms.  I also have low suspicion for PE.  I gave patient return precaution.        Final Clinical Impression(s) / ED Diagnoses Final diagnoses:   Mild intermittent asthma without complication    Rx / DC Orders ED Discharge Orders     None         Fayrene Helper, PA-C 04/13/23 1216    Bethann Berkshire, MD 04/14/23 1013

## 2023-04-16 ENCOUNTER — Other Ambulatory Visit: Payer: Self-pay

## 2023-04-16 ENCOUNTER — Ambulatory Visit: Payer: BC Managed Care – PPO | Admitting: Infectious Diseases

## 2023-04-16 NOTE — Progress Notes (Signed)
Specialty Pharmacy Refill Coordination Note  David [name] is a 57 y.o. male contacted today regarding refills of specialty medication(s) Bictegravir-Emtricitab-Tenofov   Patient requested Delivery   Delivery date: 04/23/23   Verified address: [address] [place] [address]   Medication will be filled on 04/20/23.

## 2023-04-20 ENCOUNTER — Other Ambulatory Visit (HOSPITAL_COMMUNITY): Payer: Self-pay

## 2023-04-23 ENCOUNTER — Other Ambulatory Visit: Payer: Self-pay | Admitting: Orthopedic Surgery

## 2023-05-07 ENCOUNTER — Telehealth: Payer: Self-pay

## 2023-05-07 NOTE — Telephone Encounter (Signed)
 Transition Care Management Unsuccessful Follow-up Telephone Call  Date of discharge and from where:  [facility] 11/15  Attempts:  1st Attempt  Reason for unsuccessful TCM follow-up call:  No answer/busy   Lenard Forth [place]  [facility], [facility] Guide, Phone: [phone] Website: [email or web address]

## 2023-05-07 NOTE — Telephone Encounter (Signed)
Transition Care Management Unsuccessful Follow-up Telephone Call  Date of discharge and from where:  [facility] 11/15  Attempts:  2nd Attempt  Reason for unsuccessful TCM follow-up call:  No answer/busy   Lenard Forth [place]  [facility], [facility] Guide, Phone: [phone] Website: [email or web address]

## 2023-05-10 ENCOUNTER — Other Ambulatory Visit (HOSPITAL_COMMUNITY): Payer: Self-pay

## 2023-05-14 ENCOUNTER — Other Ambulatory Visit: Payer: Self-pay

## 2023-05-14 ENCOUNTER — Ambulatory Visit: Payer: BC Managed Care – PPO | Admitting: Infectious Diseases

## 2023-05-14 NOTE — Progress Notes (Signed)
Specialty Pharmacy Refill Coordination Note  David [name] is a 57 y.o. male contacted today regarding refills of specialty medication(s) Bictegravir-Emtricitab-Tenofov Susanne Borders)   Patient requested Delivery   Delivery date: 05/22/23   Verified address: [address] [place] [address]   Medication will be filled on 05/21/23.

## 2023-05-21 ENCOUNTER — Other Ambulatory Visit: Payer: Self-pay

## 2023-05-22 ENCOUNTER — Other Ambulatory Visit: Payer: Self-pay | Admitting: Orthopedic Surgery

## 2023-05-28 ENCOUNTER — Telehealth: Payer: Self-pay | Admitting: Infectious Diseases

## 2023-05-28 NOTE — Telephone Encounter (Signed)
David [name] called to see how he should proceed with his FMLA paperwork. Does he need an appointment or can he email it? He stated sometimes his medicine upsets his stomach and he has to leave work. He needs this completed by January 4th. Patient can be reached at [phone].

## 2023-06-04 ENCOUNTER — Ambulatory Visit (INDEPENDENT_AMBULATORY_CARE_PROVIDER_SITE_OTHER): Payer: BC Managed Care – PPO | Admitting: Infectious Diseases

## 2023-06-04 ENCOUNTER — Encounter: Payer: Self-pay | Admitting: Infectious Diseases

## 2023-06-04 ENCOUNTER — Other Ambulatory Visit: Payer: Self-pay

## 2023-06-04 VITALS — BP 161/112 | HR 93 | Temp 97.7°F | Ht 73.0 in | Wt 208.0 lb

## 2023-06-04 DIAGNOSIS — Z0289 Encounter for other administrative examinations: Secondary | ICD-10-CM

## 2023-06-04 DIAGNOSIS — R110 Nausea: Secondary | ICD-10-CM | POA: Diagnosis not present

## 2023-06-04 MED ORDER — ONDANSETRON 4 MG PO TBDP: 4.0000 mg | ORAL_TABLET | Freq: Three times a day (TID) | ORAL | 2 refills | Status: AC | PRN

## 2023-06-04 NOTE — Patient Instructions (Signed)
 Try Flonase for your nose - one spray once a day per nostril Continue the netti pot - may need to increase to 3 times a week to help keep symptoms down.   Try the steroid cream on your arm - twice a day for 1 week then can downgrade to over the counter hydrocortisone cream twice a day   See you in 6 months !

## 2023-06-04 NOTE — Progress Notes (Signed)
 Subjective:  Patient ID: David [name], male    DOB: Mar 09, 1966  Age: 58 y.o. MRN: [id number]  CC:  Chief Complaint  Patient presents with   Follow-up   Subjective    HPI David [name] presents for [facility] form completion for a new medical problem.   Discussed the use of AI scribe software for clinical note transcription with the patient, who gave verbal consent to proceed.  History of Present Illness   David [name] presents with concerns about medication side effects. He reports feeling generally well but has noticed an increase in nausea isolated to when he takes his medication without eating breakfast. This has led to instances of feeling unwell by mid-afternoon. He has also noticed a recurrence of skin lesions, which he describes as 'spots' that come and go. The patient has been using a steroid cream for these lesions, which he reports has been effective for his knees but has not yet been applied to the new lesions.  The patient also reports a recent severe sinus issue, describing it as feeling like he had been hit with a hammer. He has been using a NetiPot once a week for sinus rinses, but this has not been effective in alleviating his symptoms.   The patient is adherent to his medication regimen and has a consistent routine. He has been proactive in managing his health, including changing his diet and starting a walking routine, which has resulted in a seven-pound weight loss. He has also been managing his work schedule to accommodate his health needs, with support from his employer.       Past Medical History:  Diagnosis Date   Asthma    History of syphilis 06/13/2021   Treated 4 weeks doxycycline  January 2022 for titer 1:128   HIV (human immunodeficiency virus infection) (HCC)     Social History   Socioeconomic History   Marital status: Single    Spouse name: Not on file   Number of children: Not on file   Years of education: Not on file   Highest education  level: Not on file  Occupational History   Not on file  Tobacco Use   Smoking status: Former    Current packs/day: 0.30    Types: Cigarettes   Smokeless tobacco: Never   Tobacco comments:    Quit smoking 6 months ago, around 11/2022  Vaping Use   Vaping status: Never Used  Substance and Sexual Activity   Alcohol use: No   Drug use: No   Sexual activity: Not on file  Other Topics Concern   Not on file  Social History Narrative   ** Merged History Encounter **       Social Drivers of Health   Financial Resource Strain: Not on file  Food Insecurity: No Food Insecurity (09/04/2022)   Hunger Vital Sign    Worried About Running Out of Food in the Last Year: Never true    Ran Out of Food in the Last Year: Never true  Transportation Needs: No Transportation Needs (09/04/2022)   PRAPARE - Administrator, Civil Service (Medical): No    Lack of Transportation (Non-Medical): No  Physical Activity: Not on file  Stress: Not on file  Social Connections: Not on file  Intimate Partner Violence: Not At Risk (09/04/2022)   Humiliation, Afraid, Rape, and Kick questionnaire    Fear of Current or Ex-Partner: No    Emotionally Abused: No    Physically Abused: No  Sexually Abused: No    Outpatient Medications Prior to Visit  Medication Sig Dispense Refill   albuterol  (PROVENTIL ) (2.5 MG/3ML) 0.083% nebulizer solution Take 3 mLs (2.5 mg total) by nebulization every 6 (six) hours as needed for wheezing or shortness of breath. 75 mL 12   albuterol  (VENTOLIN  HFA) 108 (90 Base) MCG/ACT inhaler Inhale 2 puffs into the lungs every 6 (six) hours as needed for wheezing or shortness of breath. 6.7 g 5   bictegravir-emtricitabine -tenofovir  AF (BIKTARVY ) 50-200-25 MG TABS tablet Take 1 tablet by mouth daily. 30 tablet 11   mupirocin  ointment (BACTROBAN ) 2 % Apply 1 Application topically 3 (three) times daily. 22 g 5   predniSONE  (DELTASONE ) 10 MG tablet TAKE 1 TABLET (10 MG TOTAL) BY MOUTH DAILY  WITH BREAKFAST. 30 tablet 0   rosuvastatin  (CRESTOR ) 10 MG tablet Take 1 tablet (10 mg total) by mouth daily. 30 tablet 5   ascorbic acid  (VITAMIN C) 500 MG tablet Take 1 tablet (500 mg total) by mouth daily. (Patient not taking: Reported on 06/04/2023) 90 tablet 0   folic acid  (FOLVITE ) 1 MG tablet Take 1 tablet (1 mg total) by mouth daily. (Patient not taking: Reported on 06/04/2023) 30 tablet 0   levofloxacin  (LEVAQUIN ) 500 MG tablet Take 1 tablet (500 mg total) by mouth daily. (Patient not taking: Reported on 06/04/2023) 7 tablet 0   zinc  sulfate 220 (50 Zn) MG capsule Take 1 capsule (220 mg total) by mouth daily. (Patient not taking: Reported on 06/04/2023) 90 capsule 0   No facility-administered medications prior to visit.    Allergies  Allergen Reactions   Penicillins Swelling    Tolerated cefepime  April 2024    ROS Review of Systems  Constitutional:  Negative for chills and fever.  All other systems reviewed and are negative.      Objective   Objective:    Vitals:   06/04/23 1259 06/04/23 1352  BP: (!) 163/111 (!) 161/112  Pulse: 93   Temp: 97.7 F (36.5 C)   SpO2: 96%     Body mass index is 27.44 kg/m.   Physical Exam Constitutional:      Appearance: Normal appearance. He is not ill-appearing.  HENT:     Head: Normocephalic.     Mouth/Throat:     Mouth: Mucous membranes are moist.     Pharynx: Oropharynx is clear.  Eyes:     General: No scleral icterus. Cardiovascular:     Rate and Rhythm: Normal rate.  Pulmonary:     Effort: Pulmonary effort is normal.  Musculoskeletal:        General: Normal range of motion.     Cervical back: Normal range of motion.  Skin:    Coloration: Skin is not jaundiced or pale.  Neurological:     Mental Status: He is alert and oriented to person, place, and time.  Psychiatric:        Mood and Affect: Mood normal.        Judgment: Judgment normal.     BP (!) 161/112   Pulse 93   Temp 97.7 F (36.5 C) (Temporal)   Ht 6'  1 (1.854 m)   Wt 208 lb (94.3 kg)   SpO2 96%   BMI 27.44 kg/m   Wt Readings from Last 3 Encounters:  06/04/23 208 lb (94.3 kg)  04/13/23 215 lb (97.5 kg)  12/18/22 215 lb (97.5 kg)     Health Maintenance Due  Topic Date Due   DTaP/Tdap/Td (1 -  Tdap) Never done   Zoster Vaccines- Shingrix  (1 of 2) Never done   Colonoscopy  Never done   INFLUENZA VACCINE  12/28/2022   COVID-19 Vaccine (4 - 2024-25 season) 01/28/2023    There are no preventive care reminders to display for this patient.  No results found for: TSH Lab Results  Component Value Date   WBC 9.6 09/07/2022   HGB 9.8 (L) 09/07/2022   HCT 29.4 (L) 09/07/2022   MCV 101.4 (H) 09/07/2022   PLT 136 (L) 09/07/2022   Lab Results  Component Value Date   NA 142 12/18/2022   K 4.0 12/18/2022   CO2 24 12/18/2022   GLUCOSE 63 (L) 12/18/2022   BUN 21 12/18/2022   CREATININE 1.49 (H) 12/18/2022   BILITOT 0.6 12/18/2022   ALKPHOS 82 09/07/2022   AST 14 12/18/2022   ALT 11 12/18/2022   PROT 7.0 12/18/2022   ALBUMIN  2.8 (L) 09/07/2022   CALCIUM  9.0 12/18/2022   ANIONGAP 5 09/07/2022   EGFR 54 (L) 12/18/2022   Lab Results  Component Value Date   CHOL 172 06/10/2020   Lab Results  Component Value Date   HDL 41 06/10/2020   Lab Results  Component Value Date   LDLCALC 111 (H) 06/10/2020   Lab Results  Component Value Date   TRIG 92 06/10/2020   Lab Results  Component Value Date   CHOLHDL 4.2 06/10/2020   No results found for: HGBA1C    Assessment & Plan:     Medication-induced Nausea Occurs when taking medication on an empty stomach. Discussed the importance of eating before taking medication and suggested keeping a substantial snack at work for days when breakfast is missed. -Provided prescription for Zofran  to manage nausea episodes. -Snack at work to ensure he can reduce the days missed from work.  -Form completion for FMLA with the patient today.   Sinus Issues Chronic sinus issues,  possibly allergy-related, causing facial pain and discomfort. -Advise to increase frequency of Neti pot use. -Recommend over-the-counter Flonase nasal spray once daily and consider adding an antihistamine if symptoms persist.  Elevated Blood Pressure Noted during visit. -Advise to recheck blood pressure today.  Follow-up in 6 months or sooner if any issues arise.      Meds ordered this encounter  Medications   ondansetron  (ZOFRAN -ODT) 4 MG disintegrating tablet    Sig: Take 1 tablet (4 mg total) by mouth every 8 (eight) hours as needed for nausea or vomiting.    Dispense:  30 tablet    Refill:  2   No orders of the defined types were placed in this encounter.    Corean Fireman, MSN, NP-C [facility]  [email or web address] Pager: [phone] Office: [phone] [facility] Main Line: [phone] *Secure Chat Communication Welcome

## 2023-06-08 ENCOUNTER — Other Ambulatory Visit: Payer: Self-pay

## 2023-06-08 IMAGING — DX DG FOOT COMPLETE 3+V*R*
3 series · 3 of 3 positions shown · non-contrast
Comparison: None.

CLINICAL DATA: Open pustules, infection

EXAM:
RIGHT FOOT COMPLETE - 3+ VIEW; RIGHT ANKLE - COMPLETE 3+ VIEW

[foot ap]
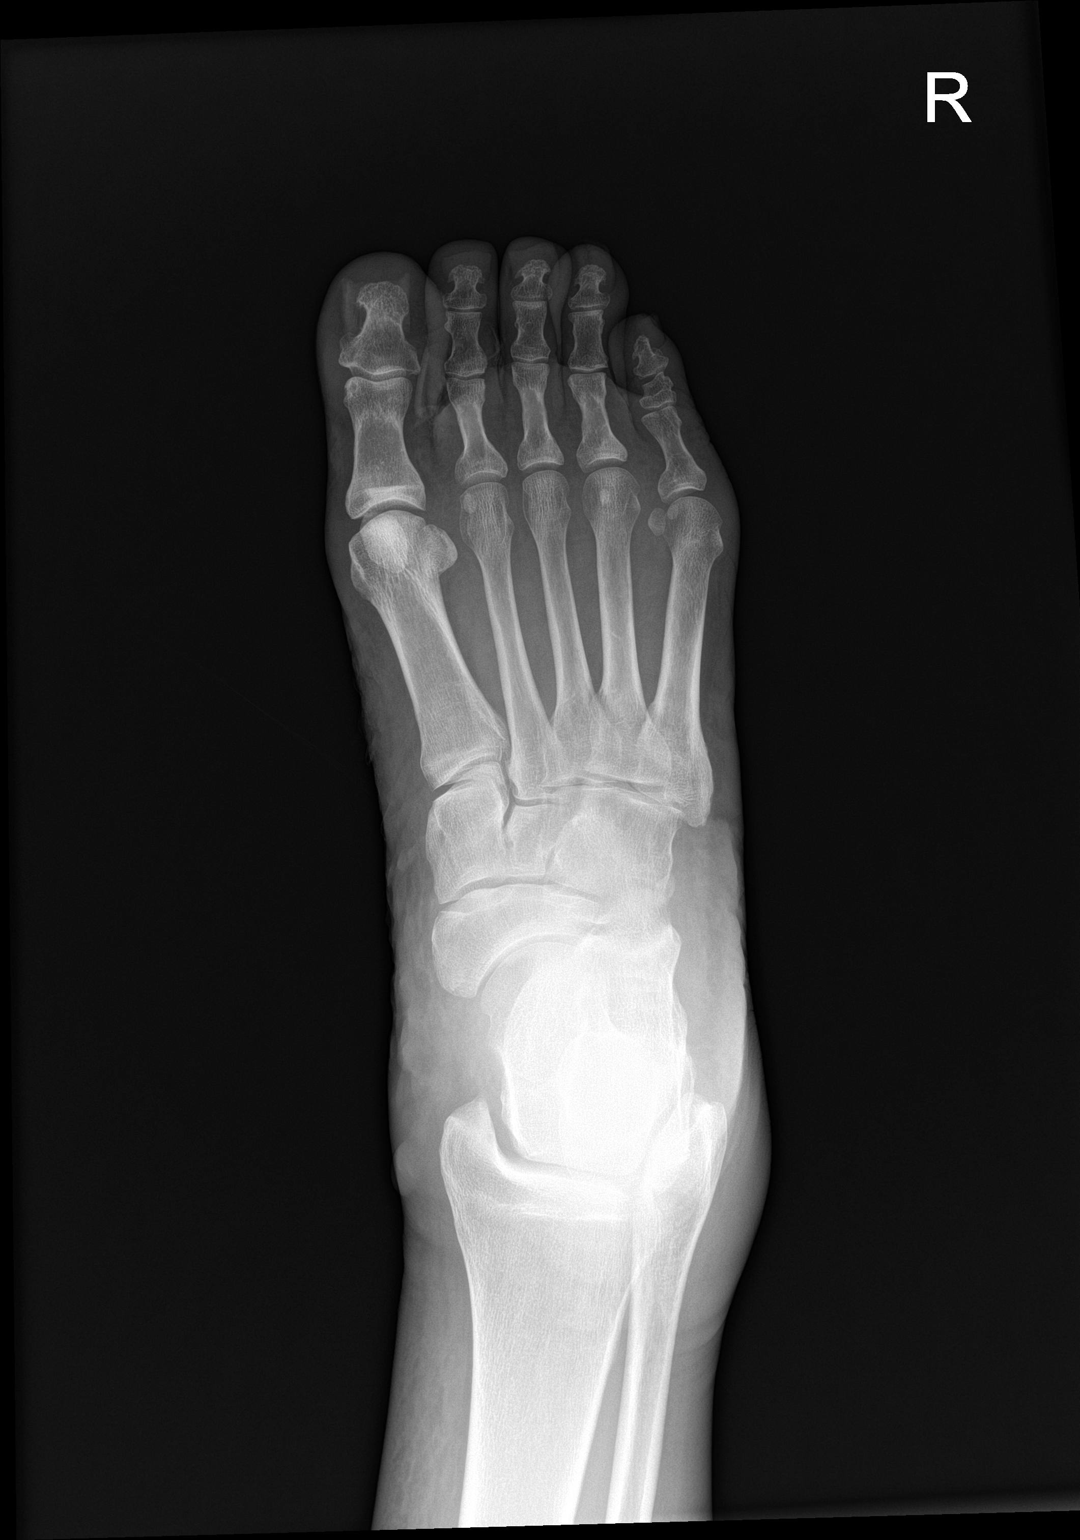

[foot obl]
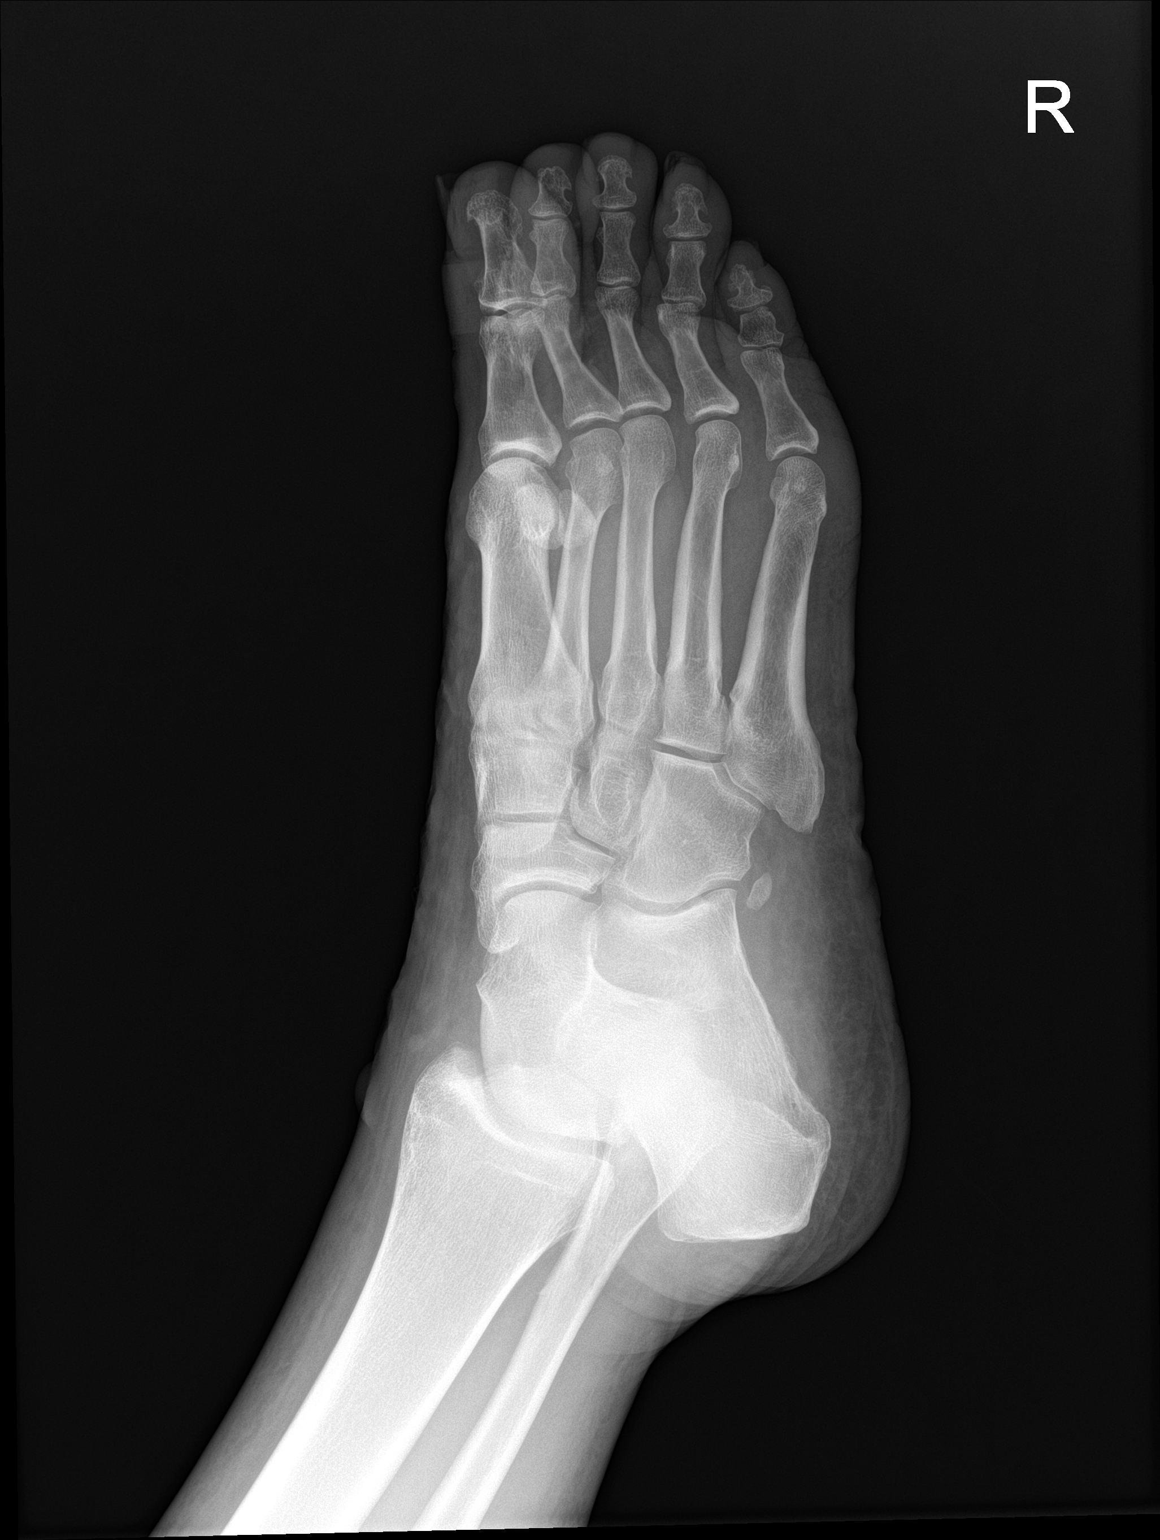

[foot lat]
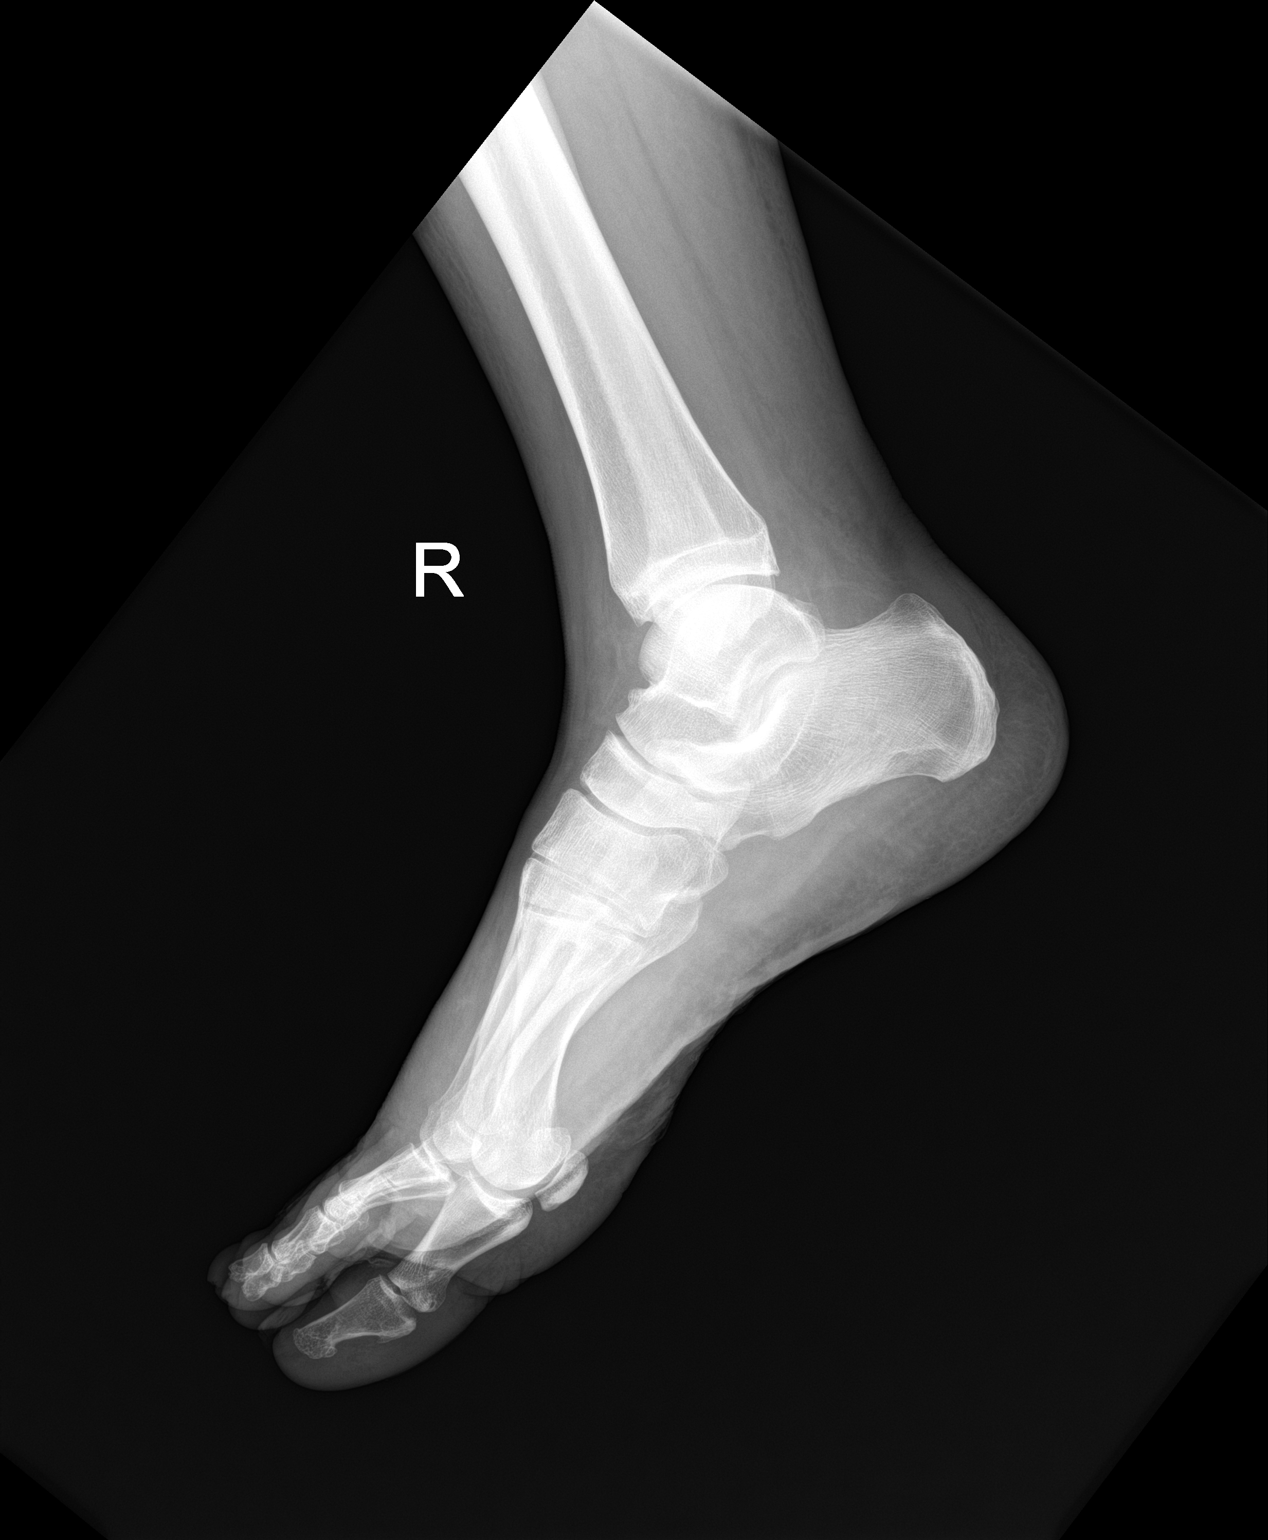

[3 of 3 positions shown; findings below may reference images not displayed]

FINDINGS: There is no evidence of fracture or dislocation. No osseous erosion.
The joint spaces are preserved. There is no evidence of arthropathy
or other focal bone abnormality. Soft tissue nodule overlying the
medial malleolus. Soft tissue edema.
IMPRESSION: No acute osseous abnormality.

## 2023-06-08 NOTE — Progress Notes (Signed)
 Specialty Pharmacy Refill Coordination Note  David [name] is a 58 y.o. male contacted today regarding refills of specialty medication(s) Bictegravir-Emtricitab-Tenofov (Biktarvy )   Patient requested Delivery   Delivery date: 06/18/23   Verified address: [address]   [place] [address]   Medication will be filled on 06/15/23.

## 2023-06-16 ENCOUNTER — Other Ambulatory Visit: Payer: Self-pay | Admitting: Infectious Diseases

## 2023-06-18 NOTE — Telephone Encounter (Signed)
Started Crestor 11/2022, okay to continue with 90 day supply? Thanks!

## 2023-06-20 ENCOUNTER — Other Ambulatory Visit: Payer: Self-pay | Admitting: Orthopedic Surgery

## 2023-07-05 ENCOUNTER — Other Ambulatory Visit (HOSPITAL_COMMUNITY): Payer: Self-pay

## 2023-07-09 ENCOUNTER — Other Ambulatory Visit: Payer: Self-pay

## 2023-07-09 ENCOUNTER — Other Ambulatory Visit (HOSPITAL_COMMUNITY): Payer: Self-pay

## 2023-07-09 NOTE — Progress Notes (Signed)
 Specialty Pharmacy Refill Coordination Note  David [name] is a 58 y.o. male contacted today regarding refills of specialty medication(s) Bictegravir-Emtricitab-Tenofov (Biktarvy )   Patient requested Delivery   Delivery date: 07/17/23   Verified address: [address]   [place] [address]   Medication will be filled on 07/16/23.

## 2023-07-14 ENCOUNTER — Emergency Department (HOSPITAL_COMMUNITY)
Admission: EM | Admit: 2023-07-14 | Discharge: 2023-07-14 | Disposition: A | Discharge status: Home / Self Care | Payer: BC Managed Care – PPO | Attending: Emergency Medicine | Admitting: Emergency Medicine

## 2023-07-14 DIAGNOSIS — R350 Frequency of micturition: Secondary | ICD-10-CM | POA: Diagnosis not present

## 2023-07-14 DIAGNOSIS — Z21 Asymptomatic human immunodeficiency virus [HIV] infection status: Secondary | ICD-10-CM | POA: Diagnosis not present

## 2023-07-14 DIAGNOSIS — I10 Essential (primary) hypertension: Secondary | ICD-10-CM | POA: Diagnosis not present

## 2023-07-14 DIAGNOSIS — J4530 Mild persistent asthma, uncomplicated: Secondary | ICD-10-CM | POA: Diagnosis not present

## 2023-07-14 DIAGNOSIS — Z87891 Personal history of nicotine dependence: Secondary | ICD-10-CM | POA: Insufficient documentation

## 2023-07-14 DIAGNOSIS — R319 Hematuria, unspecified: Secondary | ICD-10-CM | POA: Insufficient documentation

## 2023-07-14 LAB — CBC WITH DIFFERENTIAL/PLATELET
Abs Immature Granulocytes: 0.05 10*3/uL (ref 0.00–0.07)
Basophils Absolute: 0 10*3/uL (ref 0.0–0.1)
Basophils Relative: 0 %
Eosinophils Absolute: 0 10*3/uL (ref 0.0–0.5)
Eosinophils Relative: 0 %
HCT: 37.6 % — ABNORMAL LOW (ref 39.0–52.0)
Hemoglobin: 12.4 g/dL — ABNORMAL LOW (ref 13.0–17.0)
Immature Granulocytes: 1 %
Lymphocytes Relative: 18 %
Lymphs Abs: 1.6 10*3/uL (ref 0.7–4.0)
MCH: 34.6 pg — ABNORMAL HIGH (ref 26.0–34.0)
MCHC: 33 g/dL (ref 30.0–36.0)
MCV: 105 fL — ABNORMAL HIGH (ref 80.0–100.0)
Monocytes Absolute: 0.3 10*3/uL (ref 0.1–1.0)
Monocytes Relative: 4 %
Neutro Abs: 6.7 10*3/uL (ref 1.7–7.7)
Neutrophils Relative %: 77 %
Platelets: 196 10*3/uL (ref 150–400)
RBC: 3.58 MIL/uL — ABNORMAL LOW (ref 4.22–5.81)
RDW: 12.8 % (ref 11.5–15.5)
WBC: 8.7 10*3/uL (ref 4.0–10.5)
nRBC: 0 % (ref 0.0–0.2)

## 2023-07-14 LAB — URINALYSIS, ROUTINE W REFLEX MICROSCOPIC
Bacteria, UA: NONE SEEN
Glucose, UA: NEGATIVE mg/dL
Hgb urine dipstick: NEGATIVE
Ketones, ur: 5 mg/dL — AB
Nitrite: NEGATIVE
Protein, ur: 100 mg/dL — AB
Specific Gravity, Urine: 1.028 (ref 1.005–1.030)
pH: 5 (ref 5.0–8.0)

## 2023-07-14 LAB — COMPREHENSIVE METABOLIC PANEL
ALT: 24 U/L (ref 0–44)
AST: 21 U/L (ref 15–41)
Albumin: 4 g/dL (ref 3.5–5.0)
Alkaline Phosphatase: 99 U/L (ref 38–126)
Anion gap: 9 (ref 5–15)
BUN: 16 mg/dL (ref 6–20)
CO2: 24 mmol/L (ref 22–32)
Calcium: 8.9 mg/dL (ref 8.9–10.3)
Chloride: 103 mmol/L (ref 98–111)
Creatinine, Ser: 1.33 mg/dL — ABNORMAL HIGH (ref 0.61–1.24)
GFR, Estimated: >60 mL/min (ref >60)
Glucose, Bld: 112 mg/dL — ABNORMAL HIGH (ref 70–99)
Potassium: 4.2 mmol/L (ref 3.5–5.1)
Sodium: 136 mmol/L (ref 135–145)
Total Bilirubin: 1.4 mg/dL — ABNORMAL HIGH (ref 0.0–1.2)
Total Protein: 7.8 g/dL (ref 6.5–8.1)

## 2023-07-14 NOTE — Discharge Instructions (Signed)
We evaluated you for your abnormal urination.  Your urinalysis did not show any sign of blood, white blood cells, or bacteria.  We have sent a urine culture.  If your urine culture shows any signs of urine infection, we will call you to start antibiotics.  Please follow-up very closely with your primary care doctor, and if you have any new or worsening symptoms such as painful urination, testicular pain, fevers or chills, back or flank pain, abdominal pain, please return to the emergency department.

## 2023-07-14 NOTE — ED Provider Notes (Signed)
[place] EMERGENCY DEPARTMENT AT [facility] Provider Note  CSN: [id number] Arrival date & time: 07/14/23 1422  Chief Complaint(s) Urinary Frequency and Hematuria  HPI David [name] is a 58 y.o. male history of well-controlled HIV, presenting to the emergency department urinary symptoms.  Patient reports that has had increased urinary frequency for the past 5 days.  No fevers or chills.  No dysuria.  Today had some blood in urine.  No flank pain, back pain, abdominal pain, lightheadedness or dizziness.  No nausea, vomiting.  No fevers or chills.  Symptoms are mild.  He contacted his infectious disease doctor who recommended he be evaluated in the emergency department to check for infection.  Denies any urethral discharge, scrotal or testicular pain   Past Medical History Past Medical History:  Diagnosis Date   Asthma    History of syphilis 06/13/2021   Treated 4 weeks doxycycline January 2022 for titer 1:128   HIV (human immunodeficiency virus infection) [facility])    Patient Active Problem List   Diagnosis Date Noted   Bullous staphylococcal impetigo 12/18/2022   Emphysema due to alpha-1-antitrypsin deficiency (HCC) 09/04/2022   Hyperbilirubinemia 09/04/2022   AKI (acute kidney injury) (HCC) 09/04/2022   Positive D dimer 09/04/2022   Thrombocytopenia (HCC) 09/04/2022   Leukocytosis 09/04/2022   Macrocytic anemia 09/04/2022   Hypocalcemia 09/04/2022   Liver lesion 09/04/2022   Hypertension 12/19/2021   History of syphilis 06/13/2021   Elevated serum creatinine 03/18/2021   HIV disease (HCC) 06/10/2020   Screening for STDs (sexually transmitted diseases) 06/10/2020   Immunization counseling 06/10/2020   Asthma in adult, mild persistent, with acute exacerbation 05/22/2020   Asthma 05/22/2020   Home Medication(s) Prior to Admission medications   Medication Sig Start Date End Date Taking? Authorizing Provider  albuterol (PROVENTIL) (2.5 MG/3ML) 0.083% nebulizer  solution Take 3 mLs (2.5 mg total) by nebulization every 6 (six) hours as needed for wheezing or shortness of breath. 06/10/22   Concha Se, MD  albuterol (VENTOLIN HFA) 108 (90 Base) MCG/ACT inhaler Inhale 2 puffs into the lungs every 6 (six) hours as needed for wheezing or shortness of breath. 03/22/23   Blanchard Kelch, NP  ascorbic acid (VITAMIN C) 500 MG tablet Take 1 tablet (500 mg total) by mouth daily. Patient not taking: Reported on 06/04/2023 05/23/20   Arnetha Courser, MD  bictegravir-emtricitabine-tenofovir AF (BIKTARVY) 50-200-25 MG TABS tablet Take 1 tablet by mouth daily. 12/18/22   Blanchard Kelch, NP  folic acid (FOLVITE) 1 MG tablet Take 1 tablet (1 mg total) by mouth daily. Patient not taking: Reported on 06/04/2023 09/08/22   Leatha Gilding, MD  levofloxacin (LEVAQUIN) 500 MG tablet Take 1 tablet (500 mg total) by mouth daily. Patient not taking: Reported on 06/04/2023 12/19/22   Blanchard Kelch, NP  mupirocin ointment (BACTROBAN) 2 % Apply 1 Application topically 3 (three) times daily. 12/18/22   Blanchard Kelch, NP  ondansetron (ZOFRAN-ODT) 4 MG disintegrating tablet Take 1 tablet (4 mg total) by mouth every 8 (eight) hours as needed for nausea or vomiting. 06/04/23   Blanchard Kelch, NP  predniSONE (DELTASONE) 10 MG tablet TAKE 1 TABLET (10 MG TOTAL) BY MOUTH DAILY WITH BREAKFAST. 06/20/23   Adonis Huguenin, NP  rosuvastatin (CRESTOR) 10 MG tablet TAKE 1 TABLET BY MOUTH EVERY DAY 06/18/23   Blanchard Kelch, NP  zinc sulfate 220 (50 Zn) MG capsule Take 1 capsule (220 mg total) by mouth daily. Patient not  taking: Reported on 06/04/2023 05/23/20   Arnetha Courser, MD                                                                                                                                    Past Surgical History No past surgical history on file. Family History Family History  Problem Relation Age of Onset   Cancer Mother     Social History Social History    Tobacco Use   Smoking status: Former    Current packs/day: 0.30    Types: Cigarettes   Smokeless tobacco: Never   Tobacco comments:    Quit smoking 6 months ago, around 11/2022  Vaping Use   Vaping status: Never Used  Substance Use Topics   Alcohol use: No   Drug use: No   Allergies Penicillins  Review of Systems Review of Systems  All other systems reviewed and are negative.   Physical Exam Vital Signs  I have reviewed the triage vital signs BP (!) 165/113   Pulse 88   Temp 98.9 F (37.2 C) (Oral)   Resp 18   Ht 6\' 1"  (1.854 m)   Wt 94.3 kg   SpO2 100%   BMI 27.44 kg/m  Physical Exam Vitals and nursing note reviewed.  Constitutional:      General: He is not in acute distress.    Appearance: Normal appearance.  HENT:     Mouth/Throat:     Mouth: Mucous membranes are moist.  Eyes:     Conjunctiva/sclera: Conjunctivae normal.  Cardiovascular:     Rate and Rhythm: Normal rate and regular rhythm.  Pulmonary:     Effort: Pulmonary effort is normal. No respiratory distress.     Breath sounds: Normal breath sounds.  Abdominal:     General: Abdomen is flat.     Palpations: Abdomen is soft.     Tenderness: There is no abdominal tenderness. There is no right CVA tenderness or left CVA tenderness.  Musculoskeletal:     Right lower leg: No edema.     Left lower leg: No edema.  Skin:    General: Skin is warm and dry.     Capillary Refill: Capillary refill takes less than 2 seconds.  Neurological:     Mental Status: He is alert and oriented to person, place, and time. Mental status is at baseline.  Psychiatric:        Mood and Affect: Mood normal.        Behavior: Behavior normal.     ED Results and Treatments Labs (all labs ordered are listed, but only abnormal results are displayed) Labs Reviewed  COMPREHENSIVE METABOLIC PANEL - Abnormal; Notable for the following components:      Result Value   Glucose, Bld 112 (*)    Creatinine, Ser 1.33 (*)     Total Bilirubin 1.4 (*)    All other components within normal limits  CBC WITH  DIFFERENTIAL/PLATELET - Abnormal; Notable for the following components:   RBC 3.58 (*)    Hemoglobin 12.4 (*)    HCT 37.6 (*)    MCV 105.0 (*)    MCH 34.6 (*)    All other components within normal limits  URINALYSIS, ROUTINE W REFLEX MICROSCOPIC - Abnormal; Notable for the following components:   Color, Urine RED (*)    APPearance TURBID (*)    Bilirubin Urine SMALL (*)    Ketones, ur 5 (*)    Protein, ur 100 (*)    Leukocytes,Ua TRACE (*)    All other components within normal limits  URINE CULTURE                                                                                                                          Radiology No results found.  Pertinent labs & imaging results that were available during my care of the patient were reviewed by me and considered in my medical decision making (see MDM for details).  Medications Ordered in ED Medications - No data to display                                                                                                                                   Procedures Procedures  (including critical care time)  Medical Decision Making / ED Course   MDM:  58 year old presenting with urinary frequency.  Patient overall well-appearing, physical examination with no abdominal tenderness, CVA tenderness.  Differential includes urinary infection, hyperglycemia, kidney stone, pyelonephritis.  Without CVA tenderness, flank pain, lower concern for pyelonephritis or kidney stone.  Will check urinalysis, metabolic panel.  Will reassess.  Clinical Course as of 07/14/23 1912  Sat Jul 14, 2023  1911 Urinalysis report initially showing turbid and unable to report results, was changed by lab to show no hemoglobin, red urine without RBCs, WBCs, bacteria.  Unclear cause of abnormal urine, there is some trace bilirubin but low concern for any acute hepatobiliary process.   Urine culture was sent.  Advise close follow-up with his infectious disease/primary care physician, discussed that we will call for positive culture.  Discussed strict return precautions Will discharge patient to home. All questions answered. Patient comfortable with plan of discharge. Return precautions discussed with patient and specified on the after visit summary.  [WS]    Clinical Course User Index [WS] Lonell Grandchild, MD  Additional history obtained:  -External records from outside source obtained and reviewed including: Chart review including previous notes, labs, imaging, consultation notes including prior lab results    Lab Tests: -I ordered, reviewed, and interpreted labs.   The pertinent results include:   Labs Reviewed  COMPREHENSIVE METABOLIC PANEL - Abnormal; Notable for the following components:      Result Value   Glucose, Bld 112 (*)    Creatinine, Ser 1.33 (*)    Total Bilirubin 1.4 (*)    All other components within normal limits  CBC WITH DIFFERENTIAL/PLATELET - Abnormal; Notable for the following components:   RBC 3.58 (*)    Hemoglobin 12.4 (*)    HCT 37.6 (*)    MCV 105.0 (*)    MCH 34.6 (*)    All other components within normal limits  URINALYSIS, ROUTINE W REFLEX MICROSCOPIC - Abnormal; Notable for the following components:   Color, Urine RED (*)    APPearance TURBID (*)    Bilirubin Urine SMALL (*)    Ketones, ur 5 (*)    Protein, ur 100 (*)    Leukocytes,Ua TRACE (*)    All other components within normal limits  URINE CULTURE    Notable for stable CKD, anemia. Turbid urine without RBC, WBC, bacteria     Medicines ordered and prescription drug management: No orders of the defined types were placed in this encounter.   -I have reviewed the patients home medicines and have made adjustments as needed  Co morbidities that complicate the patient evaluation  Past Medical History:  Diagnosis Date   Asthma    History of syphilis  06/13/2021   Treated 4 weeks doxycycline January 2022 for titer 1:128   HIV (human immunodeficiency virus infection) (HCC)       Dispostion: Disposition decision including need for hospitalization was considered, and patient discharged from emergency department.    Final Clinical Impression(s) / ED Diagnoses Final diagnoses:  Urinary frequency     This chart was dictated using voice recognition software.  Despite best efforts to proofread,  errors can occur which can change the documentation meaning.    Lonell Grandchild, MD 07/14/23 [phone]

## 2023-07-14 NOTE — ED Triage Notes (Addendum)
Patient says they believe they have bladder infection Urinary frequency x5 days Today had one episode of blood in urine Denies any pain or dysuria Denies any other symptoms Patient was admitted last year for food poisoning/had abd CT done with no sig findings Bp elevated in triage, patient denies history of HTN - says he is nervous

## 2023-07-16 LAB — URINE CULTURE: Culture: >=100000 — AB

## 2023-07-17 ENCOUNTER — Telehealth: Payer: Self-pay

## 2023-07-17 ENCOUNTER — Telehealth (HOSPITAL_BASED_OUTPATIENT_CLINIC_OR_DEPARTMENT_OTHER): Payer: Self-pay

## 2023-07-17 NOTE — Progress Notes (Signed)
ED Antimicrobial Stewardship Positive Culture Follow Up   David [name] is an 58 y.o. male who presented to [facility] on 07/14/2023 with a chief complaint of  Chief Complaint  Patient presents with   Urinary Frequency   Hematuria    Recent Results (from the past 720 hours)  Urine Culture     Status: Abnormal   Collection Time: 07/14/23  5:53 PM   Specimen: Urine, Clean Catch  Result Value Ref Range Status   Specimen Description   Final    URINE, CLEAN CATCH Performed at [facility], [address], [place], [place] [address]    Special Requests   Final    NONE Performed at [facility], [address] [place], [place] [address]    Culture >=100,000 COLONIES/mL STREPTOCOCCUS MITIS/ORALIS (A)  Final   Report Status 07/16/2023 FINAL  Final   Organism ID, Bacteria STREPTOCOCCUS MITIS/ORALIS (A)  Final      Susceptibility   Streptococcus mitis/oralis - MIC*    PENICILLIN 1 INTERMEDIATE Intermediate     CEFTRIAXONE 0.25 SENSITIVE Sensitive     LEVOFLOXACIN <=0.25 SENSITIVE Sensitive     VANCOMYCIN 0.5 SENSITIVE Sensitive     * >=100,000 COLONIES/mL STREPTOCOCCUS MITIS/ORALIS     [x]  Patient discharged originally without antimicrobial agent and treatment is now indicated    New antibiotic prescription: Levofloxacin 250 mg daily x 3 days  F/u with ID clinic   ED Provider: Derwood Kaplan, MD   Sharin Mons, PharmD, BCPS, BCIDP Infectious Diseases Clinical Pharmacist Phone: [phone] 07/17/2023, 8:43 AM

## 2023-07-17 NOTE — Telephone Encounter (Signed)
Per Rexene Alberts, NP:  Blanchard Kelch, NP  P [facility] Triage Nurse Pool Reviewing his recent ER visit and I am a bit worried about him. Can someone give David [name] a call to check in? He had a bacteria in his urine growing that was quite unusual. The ER is going to send in some antibiotics for him (Levaquin) to take for 3 days but he needs a check in, preferably before I get back to clinic.  Can someone please call to check in on how he is feeling and get him in to see one of the docs next week (or later this week if he feels bad) for ER follow up? Preferably [facility] or Gershon Mussel but open to whomever has a slot.   Called and spoke with Tinnie Gens, he reports that he's feeling well and is picking up the antibiotics today. Scheduled for follow up with Dr. Daiva Eves next week. Asked him to please call with any concerns.   Sandie Ano, RN

## 2023-07-17 NOTE — Telephone Encounter (Signed)
Post ED Visit - Positive Culture Follow-up: Successful Patient Follow-Up  Culture assessed and recommendations reviewed by:  [x]  Sharin Mons, Pharm.D. []  Celedonio Miyamoto, Pharm.D., BCPS AQ-ID []  Garvin Fila, Pharm.D., BCPS []  Georgina Pillion, Pharm.D., BCPS []  [place], [address].D., BCPS, AAHIVP []  Estella Husk, Pharm.D., BCPS, AAHIVP []  Lysle Pearl, PharmD, BCPS []  Phillips Climes, PharmD, BCPS []  Agapito Games, PharmD, BCPS []  Verlan Friends, PharmD  Positive urine culture  [x]  Patient discharged without antimicrobial prescription and treatment is now indicated []  Organism is resistant to prescribed ED discharge antimicrobial []  Patient with positive blood cultures  Changes discussed with ED provider: Derwood Kaplan, MD New antibiotic prescription Levofloxacin 250 mg po Q day x 3 days Called to [facility] in [place]  Contacted patient, date 07/17/2023, time 1:05 pm   Sandria Senter 07/17/2023, 1:08 PM

## 2023-07-22 ENCOUNTER — Other Ambulatory Visit: Payer: Self-pay | Admitting: Family

## 2023-07-22 ENCOUNTER — Encounter: Payer: Self-pay | Admitting: Infectious Disease

## 2023-07-22 DIAGNOSIS — R3589 Other polyuria: Secondary | ICD-10-CM | POA: Insufficient documentation

## 2023-07-22 HISTORY — DX: Other polyuria: R35.89

## 2023-07-23 ENCOUNTER — Encounter: Payer: Self-pay | Admitting: Infectious Disease

## 2023-07-23 ENCOUNTER — Other Ambulatory Visit (HOSPITAL_COMMUNITY): Payer: Self-pay

## 2023-07-23 ENCOUNTER — Other Ambulatory Visit: Payer: Self-pay

## 2023-07-23 ENCOUNTER — Ambulatory Visit (INDEPENDENT_AMBULATORY_CARE_PROVIDER_SITE_OTHER): Payer: BC Managed Care – PPO | Admitting: Infectious Disease

## 2023-07-23 VITALS — BP 168/118 | HR 94 | Temp 98.5°F | Wt 212.0 lb

## 2023-07-23 DIAGNOSIS — F17210 Nicotine dependence, cigarettes, uncomplicated: Secondary | ICD-10-CM

## 2023-07-23 DIAGNOSIS — Z23 Encounter for immunization: Secondary | ICD-10-CM | POA: Diagnosis not present

## 2023-07-23 DIAGNOSIS — Z8619 Personal history of other infectious and parasitic diseases: Secondary | ICD-10-CM | POA: Diagnosis not present

## 2023-07-23 DIAGNOSIS — Z98818 Other dental procedure status: Secondary | ICD-10-CM

## 2023-07-23 DIAGNOSIS — B20 Human immunodeficiency virus [HIV] disease: Secondary | ICD-10-CM | POA: Diagnosis not present

## 2023-07-23 DIAGNOSIS — R319 Hematuria, unspecified: Secondary | ICD-10-CM

## 2023-07-23 DIAGNOSIS — I10 Essential (primary) hypertension: Secondary | ICD-10-CM

## 2023-07-23 DIAGNOSIS — Z7185 Encounter for immunization safety counseling: Secondary | ICD-10-CM

## 2023-07-23 DIAGNOSIS — R3589 Other polyuria: Secondary | ICD-10-CM

## 2023-07-23 MED ORDER — BIKTARVY 50-200-25 MG PO TABS
1.0000 | ORAL_TABLET | Freq: Every day | ORAL | 11 refills | Status: DC
  Filled 2023-07-23 – 2023-08-09 (×3): qty 30, 30d supply, fill #0
  Filled 2023-09-11: qty 30, 30d supply, fill #1
  Filled 2023-10-09: qty 30, 30d supply, fill #2
  Filled 2023-11-06 – 2023-11-07 (×2): qty 30, 30d supply, fill #3
  Filled 2023-12-04: qty 30, 30d supply, fill #4
  Filled 2024-01-02 – 2024-01-04 (×3): qty 30, 30d supply, fill #5
  Filled 2024-01-31: qty 30, 30d supply, fill #6
  Filled 2024-03-05 – 2024-03-11 (×2): qty 30, 30d supply, fill #7
  Filled 2024-04-03: qty 30, 30d supply, fill #8
  Filled 2024-05-02 – 2024-05-09 (×2): qty 30, 30d supply, fill #9
  Filled 2024-06-05: qty 30, 30d supply, fill #10

## 2023-07-23 MED ORDER — ROSUVASTATIN CALCIUM 10 MG PO TABS
10.0000 mg | ORAL_TABLET | Freq: Every day | ORAL | 1 refills | Status: DC
  Filled 2023-07-23: qty 30, 30d supply, fill #0

## 2023-07-23 NOTE — Telephone Encounter (Signed)
 Message sent to pt.

## 2023-07-23 NOTE — Progress Notes (Signed)
 Subjective:  Chief complaint: follow-up for HIV disease on medications having been to ER for hematuria   Patient ID: David [name], male    DOB: [birth year], 58 y.o.   MRN: [id number]  HPI  Discussed the use of AI scribe software for clinical note transcription with the patient, who gave verbal consent to proceed.  History of Present Illness   The patient, with a history of HIV and polycystic liver disease, seen on MRI liver in April, presented to the ER last Saturday with hematuria. He denies any other symptoms such as fever, chills, dysuria, suprapubic pain, flank pain, or increased urinary frequency.--though ER note mentions that  last symptom of polyuria. UA in the ER actually did not show blood by time UA was performed by ketones, protein and Leukocytes. Urine culture grew streptococcus mitis/oralis. Blood cultures not done. The patient reports feeling much better since the ER visit. He was given antibiotics for a presumed urinary tract infection, but the patient did not have any typical symptoms of a urinary tract infection and absent this he therefore DID NOT have a UTI but asymptomatic bacteruira. No hematuria since ER visit. He is a smoker but been working hard on cessation.       Past Medical History:  Diagnosis Date   Asthma    History of syphilis 06/13/2021   Treated 4 weeks doxycycline January 2022 for titer 1:128   HIV (human immunodeficiency virus infection) (HCC)    Polyuria 07/22/2023    No past surgical history on file.  Family History  Problem Relation Age of Onset   Cancer Mother       Social History   Socioeconomic History   Marital status: Single    Spouse name: Not on file   Number of children: Not on file   Years of education: Not on file   Highest education level: Not on file  Occupational History   Not on file  Tobacco Use   Smoking status: Former    Current packs/day: 0.30    Types: Cigarettes   Smokeless tobacco: Never   Tobacco  comments:    Quit smoking 6 months ago, around 11/2022  Vaping Use   Vaping status: Never Used  Substance and Sexual Activity   Alcohol use: No   Drug use: No   Sexual activity: Not on file  Other Topics Concern   Not on file  Social History Narrative   ** Merged History Encounter **       Social Drivers of Health   Financial Resource Strain: Not on file  Food Insecurity: No Food Insecurity (09/04/2022)   Hunger Vital Sign    Worried About Running Out of Food in the Last Year: Never true    Ran Out of Food in the Last Year: Never true  Transportation Needs: No Transportation Needs (09/04/2022)   PRAPARE - Administrator, Civil Service (Medical): No    Lack of Transportation (Non-Medical): No  Physical Activity: Not on file  Stress: Not on file  Social Connections: Not on file    Allergies  Allergen Reactions   Penicillins Swelling    Tolerated cefepime April 2024     Current Outpatient Medications:    albuterol (PROVENTIL) (2.5 MG/3ML) 0.083% nebulizer solution, Take 3 mLs (2.5 mg total) by nebulization every 6 (six) hours as needed for wheezing or shortness of breath., Disp: 75 mL, Rfl: 12   albuterol (VENTOLIN HFA) 108 (90 Base) MCG/ACT inhaler, Inhale 2 puffs into  the lungs every 6 (six) hours as needed for wheezing or shortness of breath., Disp: 6.7 g, Rfl: 5   ascorbic acid (VITAMIN C) 500 MG tablet, Take 1 tablet (500 mg total) by mouth daily. (Patient not taking: Reported on 06/04/2023), Disp: 90 tablet, Rfl: 0   bictegravir-emtricitabine-tenofovir AF (BIKTARVY) 50-200-25 MG TABS tablet, Take 1 tablet by mouth daily., Disp: 30 tablet, Rfl: 11   folic acid (FOLVITE) 1 MG tablet, Take 1 tablet (1 mg total) by mouth daily. (Patient not taking: Reported on 06/04/2023), Disp: 30 tablet, Rfl: 0   levofloxacin (LEVAQUIN) 500 MG tablet, Take 1 tablet (500 mg total) by mouth daily. (Patient not taking: Reported on 06/04/2023), Disp: 7 tablet, Rfl: 0   mupirocin ointment  (BACTROBAN) 2 %, Apply 1 Application topically 3 (three) times daily., Disp: 22 g, Rfl: 5   ondansetron (ZOFRAN-ODT) 4 MG disintegrating tablet, Take 1 tablet (4 mg total) by mouth every 8 (eight) hours as needed for nausea or vomiting., Disp: 30 tablet, Rfl: 2   predniSONE (DELTASONE) 10 MG tablet, TAKE 1 TABLET (10 MG TOTAL) BY MOUTH DAILY WITH BREAKFAST., Disp: 30 tablet, Rfl: 0   rosuvastatin (CRESTOR) 10 MG tablet, TAKE 1 TABLET BY MOUTH EVERY DAY, Disp: 90 tablet, Rfl: 1   zinc sulfate 220 (50 Zn) MG capsule, Take 1 capsule (220 mg total) by mouth daily. (Patient not taking: Reported on 06/04/2023), Disp: 90 capsule, Rfl: 0   Review of Systems  Constitutional:  Negative for activity change, appetite change, chills, diaphoresis, fatigue, fever and unexpected weight change.  HENT:  Negative for congestion, mouth sores, rhinorrhea, sinus pressure, sneezing, sore throat and trouble swallowing.   Eyes:  Negative for photophobia and visual disturbance.  Respiratory:  Negative for cough, chest tightness, shortness of breath, wheezing and stridor.   Cardiovascular:  Negative for chest pain, palpitations and leg swelling.  Gastrointestinal:  Negative for abdominal distention, abdominal pain, anal bleeding, blood in stool, constipation, diarrhea, nausea and vomiting.  Genitourinary:  Positive for hematuria. Negative for decreased urine volume, difficulty urinating, dysuria, flank pain, frequency, genital sores, penile discharge, penile pain, penile swelling, testicular pain and urgency.  Musculoskeletal:  Negative for arthralgias, back pain, gait problem, joint swelling and myalgias.  Skin:  Negative for color change, pallor, rash and wound.  Neurological:  Negative for dizziness, tremors, weakness and light-headedness.  Hematological:  Negative for adenopathy. Does not bruise/bleed easily.  Psychiatric/Behavioral:  Negative for agitation, behavioral problems, confusion, decreased concentration,  dysphoric mood and sleep disturbance.        Objective:   Physical Exam Constitutional:      Appearance: He is well-developed.  HENT:     Head: Normocephalic and atraumatic.     Mouth/Throat:     Lips: Pink.     Mouth: Mucous membranes are moist.     Comments: No dental abscess, has missing teeth and some cavities with filling Eyes:     Conjunctiva/sclera: Conjunctivae normal.  Cardiovascular:     Rate and Rhythm: Normal rate and regular rhythm.     Heart sounds: No murmur heard.    No friction rub. No gallop.  Pulmonary:     Effort: Pulmonary effort is normal. No respiratory distress.     Breath sounds: No stridor. No wheezing or rhonchi.  Abdominal:     General: There is no distension.     Palpations: Abdomen is soft.  Musculoskeletal:        General: No tenderness. Normal range of motion.  Cervical back: Normal range of motion and neck supple.  Skin:    General: Skin is warm and dry.     Coloration: Skin is not pale.     Findings: No erythema or rash.  Neurological:     General: No focal deficit present.     Mental Status: He is alert and oriented to person, place, and time.  Psychiatric:        Mood and Affect: Mood normal.        Behavior: Behavior normal.        Thought Content: Thought content normal.        Judgment: Judgment normal.           Assessment & Plan:   Assessment and Plan    Hematuria Presented with blood in urine on 07/14/2023. No actual symptoms of urinary tract infection (UTI) such as dysuria, suprapubic pain, or flank pain. No fever or systemic symptoms. Urinalysis showed no significant blood. Most likely blood was related to kidney stones or potentially an anatomical issue. While a UTI can cause hematuria, he didnot have a UTI -If hematuria recurs, refer to urology for possible cystoscopy.  Bacteriuria Urine culture showed presence of Streptococcus mitis/oralis, which is not a typical cause of UTI. No systemic symptoms suggestive of  bacteremia.  I do NOT think that the patient had bacteremia with seeding of the urine since he felt completely normal other than the hematuria. --I expect the strep mitis/oralis was a contaiminant--for example could have inoculated the plate if microbiology tech breathed on it -No further action required at this time.  Tobacco Use Current smoker, discussed potential of vaping as a step towards cessation. -Encouraged to consider vaping as a step towards smoking cessation.  HIV Stable on Biktarvy, last labs in July showed undetectable viral load and healthy CD4 count. -Continue Biktarvy as prescribed.  Hyperlipidemia On rosuvastatin. -Continue rosuvastatin as prescribed.  General Health Maintenance -Administer influenza vaccine today. -Encouraged to receive tetanus vaccine if due and it is but he doesn not want one today      Dental health: going to see Ambulatory dental clinic here at [facility]. I again do not think he had odontogenic infection with bacteremia with seeding of urine, something his urine culture would have me worried about in different circumstances

## 2023-07-30 ENCOUNTER — Other Ambulatory Visit: Payer: Self-pay | Admitting: Family

## 2023-08-01 ENCOUNTER — Telehealth: Payer: Self-pay | Admitting: Family

## 2023-08-01 ENCOUNTER — Other Ambulatory Visit (HOSPITAL_COMMUNITY): Payer: Self-pay

## 2023-08-01 MED ORDER — PREDNISONE 10 MG PO TABS: 10.0000 mg | ORAL_TABLET | Freq: Every day | ORAL | 0 refills | Status: DC

## 2023-08-01 NOTE — Telephone Encounter (Signed)
 Pt called in today stating that he is out of hospital and recent flare up has gone away. Asking if he can get a prednisone refill at this time.

## 2023-08-01 NOTE — Addendum Note (Signed)
 Addended by: Barnie Del R on: 08/01/2023 01:11 PM   Modules accepted: Orders

## 2023-08-09 ENCOUNTER — Other Ambulatory Visit: Payer: Self-pay

## 2023-08-09 ENCOUNTER — Other Ambulatory Visit (HOSPITAL_COMMUNITY): Payer: Self-pay

## 2023-08-09 NOTE — Progress Notes (Signed)
 Specialty Pharmacy Refill Coordination Note  David [name] is a 58 y.o. male contacted today regarding refills of specialty medication(s) Bictegravir-Emtricitab-Tenofov Susanne Borders)   Patient requested Delivery   Delivery date: 08/16/23   Verified address: [address]   [place] [address]   Medication will be filled on 08/15/23.

## 2023-08-15 ENCOUNTER — Other Ambulatory Visit: Payer: Self-pay

## 2023-08-28 ENCOUNTER — Other Ambulatory Visit: Payer: Self-pay | Admitting: Family

## 2023-09-03 ENCOUNTER — Other Ambulatory Visit: Payer: Self-pay | Admitting: Family

## 2023-09-11 ENCOUNTER — Other Ambulatory Visit (HOSPITAL_COMMUNITY): Payer: Self-pay

## 2023-09-11 ENCOUNTER — Other Ambulatory Visit: Payer: Self-pay | Admitting: Infectious Diseases

## 2023-09-11 ENCOUNTER — Other Ambulatory Visit: Payer: Self-pay

## 2023-09-11 MED ORDER — ALBUTEROL SULFATE HFA 108 (90 BASE) MCG/ACT IN AERS
2.0000 | INHALATION_SPRAY | Freq: Four times a day (QID) | RESPIRATORY_TRACT | 1 refills | Status: DC | PRN
  Filled 2023-09-11: qty 6.7, 25d supply, fill #0
  Filled 2023-10-09: qty 6.7, 25d supply, fill #1

## 2023-09-11 NOTE — Progress Notes (Signed)
 Specialty Pharmacy Ongoing Clinical Assessment Note  David [name] is a 58 y.o. male who is being followed by the specialty pharmacy service for RxSp HIV   Patient's specialty medication(s) reviewed today: Bictegravir-Emtricitab-Tenofov (Biktarvy)   Missed doses in the last 4 weeks: 0   Patient/Caregiver did not have any additional questions or concerns.   Therapeutic benefit summary: Patient is achieving benefit   Adverse events/side effects summary: No adverse events/side effects   Patient's therapy is appropriate to: Continue    Goals Addressed             This Visit's Progress    Achieve Undetectable HIV Viral Load < 20   On track    Patient is on track. Patient will maintain adherence.  Patient's viral load remains undetectable as of 12/18/22.          Follow up:  6 months  [name] Specialty Pharmacist

## 2023-09-11 NOTE — Progress Notes (Signed)
 Specialty Pharmacy Refill Coordination Note  David [name] is a 58 y.o. male contacted today regarding refills of specialty medication(s) Bictegravir-Emtricitab-Tenofov Maryruth Sol)   Patient requested Delivery   Delivery date: 09/20/23   Verified address: [address]   [place] [address]   Medication will be filled on 09/19/23.

## 2023-09-19 ENCOUNTER — Other Ambulatory Visit: Payer: Self-pay

## 2023-09-26 ENCOUNTER — Other Ambulatory Visit (HOSPITAL_COMMUNITY): Payer: Self-pay

## 2023-09-30 ENCOUNTER — Other Ambulatory Visit: Payer: Self-pay | Admitting: Orthopedic Surgery

## 2023-10-09 ENCOUNTER — Other Ambulatory Visit: Payer: Self-pay

## 2023-10-09 NOTE — Progress Notes (Signed)
 Specialty Pharmacy Refill Coordination Note  David [name] is a 58 y.o. male contacted today regarding refills of specialty medication(s) Bictegravir-Emtricitab-Tenofov (Biktarvy )   Patient requested Delivery   Delivery date: 10/16/23   Verified address: [address]   [place] [address]   Medication will be filled on 10/15/23.

## 2023-10-10 NOTE — Progress Notes (Signed)
 The ASCVD Risk score (Arnett DK, et al., 2019) failed to calculate for the following reasons:   Cannot find a previous HDL lab   Cannot find a previous total cholesterol lab  Arlon Bergamo, BSN, RN

## 2023-11-04 ENCOUNTER — Other Ambulatory Visit: Payer: Self-pay | Admitting: Orthopedic Surgery

## 2023-11-06 ENCOUNTER — Other Ambulatory Visit: Payer: Self-pay

## 2023-11-07 ENCOUNTER — Other Ambulatory Visit: Payer: Self-pay | Admitting: Infectious Diseases

## 2023-11-07 ENCOUNTER — Other Ambulatory Visit: Payer: Self-pay

## 2023-11-07 MED ORDER — ALBUTEROL SULFATE HFA 108 (90 BASE) MCG/ACT IN AERS
2.0000 | INHALATION_SPRAY | Freq: Four times a day (QID) | RESPIRATORY_TRACT | 5 refills | Status: AC | PRN
  Filled 2023-11-07 – 2023-12-04 (×2): qty 6.7, 25d supply, fill #0
  Filled 2024-01-04 – 2024-01-25 (×3): qty 6.7, 25d supply, fill #1
  Filled 2024-03-11: qty 6.7, 25d supply, fill #2
  Filled 2024-04-03: qty 6.7, 25d supply, fill #3
  Filled 2024-06-05 (×2): qty 6.7, 25d supply, fill #4

## 2023-11-07 NOTE — Telephone Encounter (Signed)
 Please advise on refill request

## 2023-11-07 NOTE — Progress Notes (Signed)
 Specialty Pharmacy Refill Coordination Note  [name] is a 58 y.o. male contacted today regarding refills of specialty medication(s) Bictegravir-Emtricitab-Tenofov (Biktarvy )   Patient requested Delivery   Delivery date: 11/12/23   Verified address: [address]   [place] [address]   Medication will be filled on 11/09/23.

## 2023-11-08 ENCOUNTER — Other Ambulatory Visit: Payer: Self-pay

## 2023-11-08 ENCOUNTER — Other Ambulatory Visit (HOSPITAL_COMMUNITY): Payer: Self-pay

## 2023-11-13 ENCOUNTER — Other Ambulatory Visit: Payer: Self-pay

## 2023-11-26 ENCOUNTER — Ambulatory Visit: Payer: BC Managed Care – PPO | Admitting: Infectious Diseases

## 2023-12-02 ENCOUNTER — Other Ambulatory Visit: Payer: Self-pay | Admitting: Orthopedic Surgery

## 2023-12-04 ENCOUNTER — Other Ambulatory Visit: Payer: Self-pay

## 2023-12-04 NOTE — Progress Notes (Signed)
 Specialty Pharmacy Refill Coordination Note  David [name] is a 58 y.o. male contacted today regarding refills of specialty medication(s) Bictegravir-Emtricitab-Tenofov (Biktarvy )   Patient requested Delivery   Delivery date: 12/11/23   Verified address: [address]   [place] [address]   Medication will be filled on 12/10/23.

## 2023-12-10 ENCOUNTER — Other Ambulatory Visit: Payer: Self-pay

## 2023-12-17 ENCOUNTER — Ambulatory Visit: Admitting: Infectious Diseases

## 2023-12-20 ENCOUNTER — Other Ambulatory Visit: Payer: Self-pay

## 2023-12-20 ENCOUNTER — Ambulatory Visit: Admitting: Infectious Diseases

## 2023-12-20 ENCOUNTER — Encounter: Payer: Self-pay | Admitting: Infectious Diseases

## 2023-12-20 VITALS — BP 155/114 | HR 95 | Temp 99.1°F | Wt 212.2 lb

## 2023-12-20 DIAGNOSIS — L309 Dermatitis, unspecified: Secondary | ICD-10-CM

## 2023-12-20 DIAGNOSIS — Z1159 Encounter for screening for other viral diseases: Secondary | ICD-10-CM | POA: Diagnosis not present

## 2023-12-20 DIAGNOSIS — Z8619 Personal history of other infectious and parasitic diseases: Secondary | ICD-10-CM

## 2023-12-20 DIAGNOSIS — Z0289 Encounter for other administrative examinations: Secondary | ICD-10-CM

## 2023-12-20 DIAGNOSIS — B20 Human immunodeficiency virus [HIV] disease: Secondary | ICD-10-CM

## 2023-12-20 MED ORDER — CLOBETASOL PROPIONATE 0.05 % EX LOTN: 1.0000 | TOPICAL_LOTION | Freq: Two times a day (BID) | CUTANEOUS | 2 refills | Status: AC

## 2023-12-20 MED ORDER — SHINGRIX 50 MCG/0.5ML IM SUSR: 0.5000 mL | INTRAMUSCULAR | 1 refills | Status: AC

## 2023-12-20 MED ORDER — ALBUTEROL SULFATE (2.5 MG/3ML) 0.083% IN NEBU: 2.5000 mg | INHALATION_SOLUTION | Freq: Four times a day (QID) | RESPIRATORY_TRACT | 12 refills | Status: DC | PRN

## 2023-12-20 NOTE — Progress Notes (Signed)
 Subjective:  Patient ID: David [name], male    DOB: Dec 07, 1965  Age: 58 y.o. MRN: [id number]  CC:  Chief Complaint  Patient presents with   Follow-up    Refill nebulizer sol/ fmla/   Subjective    HPI David [name] presents for David [name] form completion for a new medical problem.   Discussed the use of AI scribe software for clinical note transcription with the patient, who gave verbal consent to proceed.  History of Present Illness   David [name] is a 58 year old male with HIV who presents for a follow-up visit to discuss medication side effects and FMLA paperwork.  He experiences nausea approximately thirty minutes after taking his medication, especially when not eating beforehand. He takes herbal supplements such as ginger and Tumorex, which can exacerbate nausea if taken on an empty stomach. He manages his HIV with medication and spaces out his supplements to avoid interactions. Despite measures to prevent nausea, he still has days where the nausea is severe enough to interfere with work and ability to perform job responsibilities well and proficiently. He says these episodes are occurring about 6 days of the month.   He has a history of shingles, which affected the side of his face and head, causing significant discomfort. He wants to avoid a recurrence of shingles due to the severe pain he experienced previously.    Flare up of dermatitis on right ankle, especially at night. He uses a cream that provides relief, but the itching persists, particularly in warm weather or wears socks.    He describes an incident where he struggled to reach his car due to the heat, indicating that extreme temperatures exacerbate his breathing difficulties. Needs refill for his albuterol .       Past Medical History:  Diagnosis Date   Asthma    History of syphilis 06/13/2021   Treated 4 weeks doxycycline  January 2022 for titer 1:128   HIV (human immunodeficiency virus infection)  (HCC)    Polyuria 07/22/2023    Social History   Socioeconomic History   Marital status: Single    Spouse name: Not on file   Number of children: Not on file   Years of education: Not on file   Highest education level: Not on file  Occupational History   Not on file  Tobacco Use   Smoking status: Every Day    Current packs/day: 0.30    Types: Cigarettes   Smokeless tobacco: Never   Tobacco comments:    Cutting down   Vaping Use   Vaping status: Never Used  Substance and Sexual Activity   Alcohol use: No   Drug use: No   Sexual activity: Not on file  Other Topics Concern   Not on file  Social History Narrative   ** Merged History Encounter **       Social Drivers of Health   Financial Resource Strain: Not on file  Food Insecurity: No Food Insecurity (09/04/2022)   Hunger Vital Sign    Worried About Running Out of Food in the Last Year: Never true    Ran Out of Food in the Last Year: Never true  Transportation Needs: No Transportation Needs (09/04/2022)   PRAPARE - Administrator, Civil Service (Medical): No    Lack of Transportation (Non-Medical): No  Physical Activity: Not on file  Stress: Not on file  Social Connections: Not on file  Intimate Partner Violence: Not At Risk (09/04/2022)  Humiliation, Afraid, Rape, and Kick questionnaire    Fear of Current or Ex-Partner: No    Emotionally Abused: No    Physically Abused: No    Sexually Abused: No    Outpatient Medications Prior to Visit  Medication Sig Dispense Refill   albuterol  (VENTOLIN  HFA) 108 (90 Base) MCG/ACT inhaler Inhale 2 puffs into the lungs every 6 (six) hours as needed for wheezing or shortness of breath. 6.7 g 5   bictegravir-emtricitabine -tenofovir  AF (BIKTARVY ) 50-200-25 MG TABS tablet Take 1 tablet by mouth daily. 30 tablet 11   albuterol  (PROVENTIL ) (2.5 MG/3ML) 0.083% nebulizer solution Take 3 mLs (2.5 mg total) by nebulization every 6 (six) hours as needed for wheezing or shortness  of breath. 75 mL 12   predniSONE  (DELTASONE ) 10 MG tablet TAKE 1 TABLET (10 MG TOTAL) BY MOUTH DAILY WITH BREAKFAST. 30 tablet 0   rosuvastatin  (CRESTOR ) 10 MG tablet Take 1 tablet (10 mg total) by mouth daily. 90 tablet 1   ascorbic acid  (VITAMIN C) 500 MG tablet Take 1 tablet (500 mg total) by mouth daily. (Patient not taking: Reported on 12/20/2023) 90 tablet 0   folic acid  (FOLVITE ) 1 MG tablet Take 1 tablet (1 mg total) by mouth daily. (Patient not taking: Reported on 12/20/2023) 30 tablet 0   mupirocin  ointment (BACTROBAN ) 2 % Apply 1 Application topically 3 (three) times daily. 22 g 5   ondansetron  (ZOFRAN -ODT) 4 MG disintegrating tablet Take 1 tablet (4 mg total) by mouth every 8 (eight) hours as needed for nausea or vomiting. 30 tablet 2   zinc  sulfate 220 (50 Zn) MG capsule Take 1 capsule (220 mg total) by mouth daily. (Patient not taking: Reported on 12/20/2023) 90 capsule 0   levofloxacin  (LEVAQUIN ) 500 MG tablet Take 1 tablet (500 mg total) by mouth daily. (Patient not taking: Reported on 12/20/2023) 7 tablet 0   No facility-administered medications prior to visit.    Allergies  Allergen Reactions   Penicillins Swelling    Tolerated cefepime  April 2024     ROS Review of Systems  Constitutional:  Negative for chills and fever.  All other systems reviewed and are negative.      Objective   Objective:    Vitals:   12/20/23 1401 12/20/23 1541  BP: (!) 168/118 (!) 155/114  Pulse: 95   Temp: 99.1 F (37.3 C)   SpO2: 95%     Body mass index is 28 kg/m.   Physical Exam Constitutional:      Appearance: Normal appearance. He is not ill-appearing.  HENT:     Head: Normocephalic.     Mouth/Throat:     Mouth: Mucous membranes are moist.     Pharynx: Oropharynx is clear.  Eyes:     General: No scleral icterus. Cardiovascular:     Rate and Rhythm: Normal rate.  Pulmonary:     Effort: Pulmonary effort is normal.  Musculoskeletal:        General: Normal range of  motion.     Cervical back: Normal range of motion.  Skin:    Coloration: Skin is not jaundiced or pale.  Neurological:     Mental Status: He is alert and oriented to person, place, and time.  Psychiatric:        Mood and Affect: Mood normal.        Judgment: Judgment normal.     BP (!) 155/114   Pulse 95   Temp 99.1 F (37.3 C) (Oral)   Wt 212 lb  3.2 oz (96.3 kg)   SpO2 95%   BMI 28.00 kg/m   Wt Readings from Last 3 Encounters:  12/20/23 212 lb 3.2 oz (96.3 kg)  07/23/23 212 lb (96.2 kg)  07/14/23 208 lb (94.3 kg)     Health Maintenance Due  Topic Date Due   DTaP/Tdap/Td (1 - Tdap) Never done   Hepatitis B Vaccines (1 of 3 - 19+ 3-dose series) 02/04/1984   Zoster Vaccines- Shingrix  (1 of 2) Never done   Colonoscopy  Never done   COVID-19 Vaccine (4 - 2024-25 season) 01/28/2023   INFLUENZA VACCINE  12/28/2023       Topic Date Due   Hepatitis B Vaccines (1 of 3 - 19+ 3-dose series) 02/04/1984    No results found for: TSH Lab Results  Component Value Date   WBC 8.7 07/14/2023   HGB 12.4 (L) 07/14/2023   HCT 37.6 (L) 07/14/2023   MCV 105.0 (H) 07/14/2023   PLT 196 07/14/2023   Lab Results  Component Value Date   NA 136 07/14/2023   K 4.2 07/14/2023   CO2 24 07/14/2023   GLUCOSE 112 (H) 07/14/2023   BUN 16 07/14/2023   CREATININE 1.33 (H) 07/14/2023   BILITOT 1.4 (H) 07/14/2023   ALKPHOS 99 07/14/2023   AST 21 07/14/2023   ALT 24 07/14/2023   PROT 7.8 07/14/2023   ALBUMIN  4.0 07/14/2023   CALCIUM  8.9 07/14/2023   ANIONGAP 9 07/14/2023   EGFR 54 (L) 12/18/2022   Lab Results  Component Value Date   CHOL 172 06/10/2020   Lab Results  Component Value Date   HDL 41 06/10/2020   Lab Results  Component Value Date   LDLCALC 111 (H) 06/10/2020   Lab Results  Component Value Date   TRIG 92 06/10/2020   Lab Results  Component Value Date   CHOLHDL 4.2 06/10/2020   No results found for: HGBA1C    Assessment & Plan:     HIV infection  - HIV infection is well-managed with no current problems with adherence or access to Biktarvy . Noted nausea side effects with Biktarvy  despite measures to prevent. FMLA paperwork completed to allow for liberal bathroom access and time off if needed. Will discuss anal cancer screen next office visit.  - update blood work VL, CD4, RPR and MMR titer  - Ensure refills of current HIV medication. - Advise to space out supplements and vitamins by six hours from HIV medication.  Shingles Prevention with H/O the same -  Previous episode affecting the face and head, causing significant discomfort. Increased risk of recurrence with age and immunocompromising conditions such as HIV. - Write prescription for shingles vaccine. - Advise to schedule vaccination when he has a couple of days off to manage potential side effects such as sore arm and mild systemic symptoms. - Second dose due 74m after first   Dermatosis -  Intermittent dry, itchy skin, particularly bothersome at night. Current treatment provides relief, but itching persists. - Refill clobetasol  lotion for severe flare-ups. - Advise on modifying sock use to reduce moisture retention.  General Health Maintenance - Reviewed recent vaccinations including pneumonia, COVID, and flu shots. Discussed the need for measles immunity check due to recent cases in the area. - Order blood work including antibody titer for measles immunity. - Ensure refills of albuterol  and other medications.      Meds ordered this encounter  Medications   albuterol  (PROVENTIL ) (2.5 MG/3ML) 0.083% nebulizer solution    Sig: Take 3  mLs (2.5 mg total) by nebulization every 6 (six) hours as needed for wheezing or shortness of breath.    Dispense:  75 mL    Refill:  12   Zoster Vaccine Adjuvanted (SHINGRIX ) injection    Sig: Inject 0.5 mLs into the muscle every 8 (eight) weeks. Two doses    Dispense:  0.5 mL    Refill:  1   Clobetasol  Propionate 0.05 % lotion    Sig: Apply  1 Application topically 2 (two) times daily.    Dispense:  118 mL    Refill:  2   Orders Placed This Encounter  Procedures   HIV 1 RNA quant-no reflex-bld   T-helper cells (CD4) count   RPR   Measles/Mumps/Rubella Immunity   Rpr titer   T PALLIDUM AB     Corean Fireman, MSN, NP-C [facility]  [email or web address] Pager: [phone] Office: [phone] [facility] Main Line: [phone] *Secure Chat Communication Welcome

## 2023-12-20 NOTE — Patient Instructions (Signed)
 Refills for your albuterol  nebulizer and the clobetasol  lotion sent in   Will work on your FMLA for you to cover days off for appointments and side effects to treatments.   Would like you to get the shingles vaccine - can take the prescription next door or to your local pharmacy

## 2023-12-21 LAB — T-HELPER CELLS (CD4) COUNT (NOT AT ARMC)
CD4 % Helper T Cell: 35 % (ref 33–65)
CD4 T Cell Abs: 577 /uL (ref 400–1790)

## 2023-12-23 ENCOUNTER — Other Ambulatory Visit: Payer: Self-pay | Admitting: Infectious Diseases

## 2023-12-26 LAB — MEASLES/MUMPS/RUBELLA IMMUNITY
Mumps IgG: 86.2 [AU]/ml
Rubella: 2.24 {index}
Rubeola IgG: 145 [AU]/ml

## 2023-12-26 LAB — T PALLIDUM AB: T Pallidum Abs: POSITIVE — AB

## 2023-12-26 LAB — RPR TITER: RPR Titer: 1:32 {titer} — ABNORMAL HIGH

## 2023-12-26 LAB — RPR: RPR Ser Ql: REACTIVE — AB

## 2023-12-26 LAB — HIV-1 RNA QUANT-NO REFLEX-BLD
HIV 1 RNA Quant: NOT DETECTED {copies}/mL
HIV-1 RNA Quant, Log: NOT DETECTED {Log_copies}/mL

## 2023-12-27 ENCOUNTER — Other Ambulatory Visit: Payer: Self-pay | Admitting: Family

## 2023-12-31 ENCOUNTER — Other Ambulatory Visit: Payer: Self-pay | Admitting: Orthopedic Surgery

## 2023-12-31 ENCOUNTER — Ambulatory Visit: Payer: Self-pay | Admitting: Infectious Diseases

## 2023-12-31 NOTE — Telephone Encounter (Signed)
 Spoke with patient regarding results. No questions at this time. Juanita Laster, RMA

## 2023-12-31 NOTE — Telephone Encounter (Signed)
-----   Message from [place] sent at 12/31/2023  3:12 PM EDT ----- Please call David [name] to let him know his lab results:   Syphilis screening - nothing concerning, no new infection from what I can tell.  HIV viral load is undetectable - great!  He is immune to measles and does not need any booster vaccination.  His CD4 count is 577 - perfect!   No changes from me.  ----- Message ----- From: Rebecka, [facility] [place] Sent: 12/21/2023  10:44 AM EDT To: Corean LOISE Fireman, NP

## 2024-01-02 ENCOUNTER — Other Ambulatory Visit (HOSPITAL_COMMUNITY): Payer: Self-pay

## 2024-01-02 ENCOUNTER — Other Ambulatory Visit: Payer: Self-pay

## 2024-01-04 ENCOUNTER — Other Ambulatory Visit: Payer: Self-pay

## 2024-01-04 NOTE — Progress Notes (Signed)
 Specialty Pharmacy Refill Coordination Note  David [name] is a 58 y.o. male contacted today regarding refills of specialty medication(s) Bictegravir-Emtricitab-Tenofov (Biktarvy )   Patient requested Delivery   Delivery date: 01/09/24   Verified address: [address]   [place] [address]   Medication will be filled on 01/08/24.

## 2024-01-08 ENCOUNTER — Other Ambulatory Visit: Payer: Self-pay

## 2024-01-08 ENCOUNTER — Other Ambulatory Visit (HOSPITAL_COMMUNITY): Payer: Self-pay

## 2024-01-10 ENCOUNTER — Other Ambulatory Visit (HOSPITAL_COMMUNITY): Payer: Self-pay

## 2024-01-11 ENCOUNTER — Other Ambulatory Visit: Payer: Self-pay

## 2024-01-25 ENCOUNTER — Other Ambulatory Visit: Payer: Self-pay

## 2024-01-25 ENCOUNTER — Other Ambulatory Visit (HOSPITAL_COMMUNITY): Payer: Self-pay

## 2024-01-30 ENCOUNTER — Other Ambulatory Visit: Payer: Self-pay | Admitting: Family

## 2024-01-31 ENCOUNTER — Other Ambulatory Visit: Payer: Self-pay

## 2024-02-04 ENCOUNTER — Other Ambulatory Visit: Payer: Self-pay | Admitting: Pharmacy Technician

## 2024-02-04 ENCOUNTER — Other Ambulatory Visit: Payer: Self-pay

## 2024-02-04 NOTE — Progress Notes (Signed)
 Specialty Pharmacy Refill Coordination Note  David [name] is a 58 y.o. male contacted today regarding refills of specialty medication(s) Bictegravir-Emtricitab-Tenofov (Biktarvy )   Patient requested Delivery   Delivery date: 02/12/24   Verified address: [address] [place]   Medication will be filled on 02/11/24.

## 2024-02-11 ENCOUNTER — Other Ambulatory Visit: Payer: Self-pay

## 2024-03-05 ENCOUNTER — Other Ambulatory Visit: Payer: Self-pay

## 2024-03-07 ENCOUNTER — Other Ambulatory Visit: Payer: Self-pay

## 2024-03-11 ENCOUNTER — Other Ambulatory Visit: Payer: Self-pay

## 2024-03-11 ENCOUNTER — Other Ambulatory Visit (HOSPITAL_COMMUNITY): Payer: Self-pay

## 2024-03-11 NOTE — Progress Notes (Signed)
 Specialty Pharmacy Refill Coordination Note  David [name] is a 58 y.o. male contacted today regarding refills of specialty medication(s) Bictegravir-Emtricitab-Tenofov (Biktarvy )   Patient requested Delivery   Delivery date: 03/12/24   Verified address: [address] [name]   Medication will be filled on 03/11/24.

## 2024-03-24 ENCOUNTER — Other Ambulatory Visit: Payer: Self-pay | Admitting: Family

## 2024-03-30 ENCOUNTER — Other Ambulatory Visit: Payer: Self-pay | Admitting: Family

## 2024-04-03 ENCOUNTER — Other Ambulatory Visit: Payer: Self-pay

## 2024-04-03 ENCOUNTER — Other Ambulatory Visit (HOSPITAL_COMMUNITY): Payer: Self-pay

## 2024-04-03 NOTE — Progress Notes (Signed)
 Specialty Pharmacy Refill Coordination Note  David [name] is a 58 y.o. male contacted today regarding refills of specialty medication(s) Bictegravir-Emtricitab-Tenofov (Biktarvy )   Patient requested Delivery   Delivery date: 04/10/24   Verified address: [address] [place]   Medication will be filled on: 04/09/24

## 2024-04-09 ENCOUNTER — Other Ambulatory Visit: Payer: Self-pay

## 2024-04-29 ENCOUNTER — Other Ambulatory Visit: Payer: Self-pay | Admitting: Family

## 2024-05-02 ENCOUNTER — Other Ambulatory Visit: Payer: Self-pay

## 2024-05-06 ENCOUNTER — Other Ambulatory Visit: Payer: Self-pay

## 2024-05-08 ENCOUNTER — Other Ambulatory Visit (HOSPITAL_COMMUNITY): Payer: Self-pay

## 2024-05-09 ENCOUNTER — Other Ambulatory Visit (HOSPITAL_COMMUNITY): Payer: Self-pay

## 2024-05-09 ENCOUNTER — Other Ambulatory Visit: Payer: Self-pay

## 2024-05-09 NOTE — Progress Notes (Signed)
 Specialty Pharmacy Refill Coordination Note  David [name] is a 58 y.o. male contacted today regarding refills of specialty medication(s) Bictegravir-Emtricitab-Tenofov (Biktarvy )   Patient requested Delivery   Delivery date: 05/14/24   Verified address: [address] [place]   Medication will be filled on: 05/13/24

## 2024-05-13 ENCOUNTER — Other Ambulatory Visit: Payer: Self-pay

## 2024-05-30 ENCOUNTER — Other Ambulatory Visit: Payer: Self-pay | Admitting: Family

## 2024-06-02 ENCOUNTER — Telehealth: Payer: Self-pay

## 2024-06-02 NOTE — Telephone Encounter (Signed)
 Patient called wanting to ensure clinic received [facility] recertification forms. Confirmed we have not received anything since July.   He emailed forms to [email or web address]. Forms printed and placed in provider's box for review.   Priscella Donna, BSN, RN

## 2024-06-03 NOTE — Telephone Encounter (Signed)
 Forms completed and faxed to managing company for re certification   Scanned to chart

## 2024-06-03 NOTE — Telephone Encounter (Signed)
 Forms faxed. Copy left in triage and scan folder

## 2024-06-05 ENCOUNTER — Other Ambulatory Visit (HOSPITAL_COMMUNITY): Payer: Self-pay

## 2024-06-06 ENCOUNTER — Other Ambulatory Visit (HOSPITAL_COMMUNITY): Payer: Self-pay

## 2024-06-09 ENCOUNTER — Other Ambulatory Visit: Payer: Self-pay

## 2024-06-09 ENCOUNTER — Ambulatory Visit: Admitting: Infectious Diseases

## 2024-06-09 NOTE — Progress Notes (Signed)
 Specialty Pharmacy Ongoing Clinical Assessment Note  David [name] is a 59 y.o. male who is being followed by the specialty pharmacy service for RxSp HIV   Patient's specialty medication(s) reviewed today: Bictegravir-Emtricitab-Tenofov (Biktarvy )   Missed doses in the last 4 weeks: 0   Patient/Caregiver did not have any additional questions or concerns.   Therapeutic benefit summary: Patient is achieving benefit   Adverse events/side effects summary: No adverse events/side effects   Patient's therapy is appropriate to: Continue    Goals Addressed             This Visit's Progress    Achieve Undetectable HIV Viral Load < 20   On track    Patient is on track. Patient will maintain adherence.  Patient's viral load remains undetectable as of 12/20/23.          Follow up: 12 months  [name] Specialty Pharmacist

## 2024-06-09 NOTE — Progress Notes (Signed)
 Specialty Pharmacy Refill Coordination Note  David [name] is a 59 y.o. male contacted today regarding refills of specialty medication(s) Bictegravir-Emtricitab-Tenofov (Biktarvy )   Patient requested Delivery   Delivery date: 06/16/24   Verified address: [address] [place]   Medication will be filled on: 06/13/24

## 2024-06-13 ENCOUNTER — Other Ambulatory Visit: Payer: Self-pay

## 2024-06-21 ENCOUNTER — Other Ambulatory Visit: Payer: Self-pay | Admitting: Infectious Disease

## 2024-06-21 DIAGNOSIS — B20 Human immunodeficiency virus [HIV] disease: Secondary | ICD-10-CM

## 2024-06-29 ENCOUNTER — Other Ambulatory Visit: Payer: Self-pay | Admitting: Family

## 2024-07-02 ENCOUNTER — Ambulatory Visit: Payer: Self-pay | Admitting: Infectious Diseases

## 2024-07-02 ENCOUNTER — Other Ambulatory Visit: Payer: Self-pay

## 2024-07-02 ENCOUNTER — Encounter: Payer: Self-pay | Admitting: Infectious Diseases

## 2024-07-02 ENCOUNTER — Other Ambulatory Visit (HOSPITAL_COMMUNITY): Payer: Self-pay

## 2024-07-02 VITALS — BP 172/107 | HR 97 | Temp 98.6°F | Resp 16 | Wt 208.8 lb

## 2024-07-02 DIAGNOSIS — B20 Human immunodeficiency virus [HIV] disease: Secondary | ICD-10-CM

## 2024-07-02 DIAGNOSIS — Z1211 Encounter for screening for malignant neoplasm of colon: Secondary | ICD-10-CM

## 2024-07-02 DIAGNOSIS — Z79899 Other long term (current) drug therapy: Secondary | ICD-10-CM

## 2024-07-02 DIAGNOSIS — E785 Hyperlipidemia, unspecified: Secondary | ICD-10-CM

## 2024-07-02 DIAGNOSIS — R030 Elevated blood-pressure reading, without diagnosis of hypertension: Secondary | ICD-10-CM

## 2024-07-02 MED ORDER — BIKTARVY 50-200-25 MG PO TABS
1.0000 | ORAL_TABLET | Freq: Every day | ORAL | 11 refills | Status: DC
  Filled 2024-07-02 – 2024-07-04 (×2): qty 30, 30d supply, fill #0

## 2024-07-02 MED ORDER — ALBUTEROL SULFATE (2.5 MG/3ML) 0.083% IN NEBU: 2.5000 mg | INHALATION_SOLUTION | Freq: Four times a day (QID) | RESPIRATORY_TRACT | 12 refills | Status: AC | PRN

## 2024-07-02 NOTE — Progress Notes (Signed)
 "   Subjective:  Patient ID: David [name], male    DOB: [birth year]  Age: 59 y.o. MRN: [id number]  CC:  Chief Complaint  Patient presents with   Follow-up    B20    Subjective    HPI David [name] presents for [facility] form completion for a new medical problem.   Discussed the use of AI scribe software for clinical note transcription with the patient, who gave verbal consent to proceed.  History of Present Illness   David [name] is a 59 year old male with HIV who presents for routine care.  He is taking Biktarvy , with one refill remaining. He manages his prescriptions through [employer]. He is concerned about his insurance status following job termination, which affects his coverage, and is exploring Medicaid options. He is not sexually active.  He experienced a sharp pain lasting about a week, which he attributes to changing a tire and possibly pulling a muscle. This pain has since resolved.  He has lost ten pounds, which he attributes to dietary changes, specifically reducing junk food and increasing fruit intake. He considers this weight loss intentional and positive.  He uses a nebulizer but is currently out of liquid medication for it. He recalls receiving a tetanus shot in [facility] but does not remember the exact date. He has not undergone colon cancer screening recently but had a colonoscopy approximately ten years ago.  He shares personal and family challenges, including his daughter's recent return home due to personal difficulties in [place] . He is currently supporting her and her child while she seeks employment. He was recently terminated from his job and is seeking new employment opportunities. He is pursuing a doctorate in theology and has a strong family orientation, actively supporting his daughter and granddaughter who have recently moved back in with him.       Past Medical History:  Diagnosis Date   Asthma    History of syphilis  06/13/2021   Treated 4 weeks doxycycline  January 2022 for titer 1:128   HIV (human immunodeficiency virus infection) (HCC)    Polyuria 07/22/2023    Social History   Socioeconomic History   Marital status: Single    Spouse name: Not on file   Number of children: Not on file   Years of education: Not on file   Highest education level: Not on file  Occupational History   Not on file  Tobacco Use   Smoking status: Every Day    Current packs/day: 0.30    Types: Cigarettes   Smokeless tobacco: Never   Tobacco comments:    Cutting down   Vaping Use   Vaping status: Never Used  Substance and Sexual Activity   Alcohol use: No   Drug use: No   Sexual activity: Not on file  Other Topics Concern   Not on file  Social History Narrative   ** Merged History Encounter **       Social Drivers of Health   Tobacco Use: High Risk (07/02/2024)   Patient History    Smoking Tobacco Use: Every Day    Smokeless Tobacco Use: Never    Passive Exposure: Not on file  Financial Resource Strain: Not on file  Food Insecurity: No Food Insecurity (09/04/2022)   Hunger Vital Sign    Worried About Running Out of Food in the Last Year: Never true    Ran Out of Food in the Last Year: Never true  Transportation Needs: No Transportation  Needs (09/04/2022)   PRAPARE - Administrator, Civil Service (Medical): No    Lack of Transportation (Non-Medical): No  Physical Activity: Not on file  Stress: Not on file  Social Connections: Not on file  Intimate Partner Violence: Not At Risk (09/04/2022)   Humiliation, Afraid, Rape, and Kick questionnaire    Fear of Current or Ex-Partner: No    Emotionally Abused: No    Physically Abused: No    Sexually Abused: No  Depression (PHQ2-9): Low Risk (07/23/2023)   Depression (PHQ2-9)    PHQ-2 Score: 0  Alcohol Screen: Not on file  Housing: Medium Risk (09/04/2022)   Housing    Last Housing Risk Score: 1  Utilities: Not At Risk (09/04/2022)   AHC Utilities     Threatened with loss of utilities: No  Health Literacy: Not on file    Outpatient Medications Prior to Visit  Medication Sig Dispense Refill   albuterol  (VENTOLIN  HFA) 108 (90 Base) MCG/ACT inhaler Inhale 2 puffs into the lungs every 6 (six) hours as needed for wheezing or shortness of breath. 6.7 g 5   Clobetasol  Propionate 0.05 % lotion Apply 1 Application topically 2 (two) times daily. 118 mL 2   mupirocin  ointment (BACTROBAN ) 2 % Apply 1 Application topically 3 (three) times daily. 22 g 5   predniSONE  (DELTASONE ) 10 MG tablet TAKE 1 TABLET (10 MG TOTAL) BY MOUTH DAILY WITH BREAKFAST. 30 tablet 0   rosuvastatin  (CRESTOR ) 10 MG tablet TAKE 1 TABLET BY MOUTH EVERY DAY 90 tablet 1   Zoster Vaccine Adjuvanted (SHINGRIX ) injection Inject 0.5 mLs into the muscle every 8 (eight) weeks. Two doses 0.5 mL 1   albuterol  (PROVENTIL ) (2.5 MG/3ML) 0.083% nebulizer solution Take 3 mLs (2.5 mg total) by nebulization every 6 (six) hours as needed for wheezing or shortness of breath. 75 mL 12   bictegravir-emtricitabine -tenofovir  AF (BIKTARVY ) 50-200-25 MG TABS tablet Take 1 tablet by mouth daily. 30 tablet 11   ascorbic acid  (VITAMIN C) 500 MG tablet Take 1 tablet (500 mg total) by mouth daily. (Patient not taking: Reported on 07/02/2024) 90 tablet 0   folic acid  (FOLVITE ) 1 MG tablet Take 1 tablet (1 mg total) by mouth daily. (Patient not taking: Reported on 07/02/2024) 30 tablet 0   ondansetron  (ZOFRAN -ODT) 4 MG disintegrating tablet Take 1 tablet (4 mg total) by mouth every 8 (eight) hours as needed for nausea or vomiting. (Patient not taking: Reported on 07/02/2024) 30 tablet 2   zinc  sulfate 220 (50 Zn) MG capsule Take 1 capsule (220 mg total) by mouth daily. (Patient not taking: Reported on 07/02/2024) 90 capsule 0   No facility-administered medications prior to visit.    Allergies  Allergen Reactions   Penicillins Swelling    Tolerated cefepime  April 2024     ROS Review of Systems  Constitutional:   Negative for chills and fever.  All other systems reviewed and are negative.      Objective   Objective:    Vitals:   07/02/24 1344 07/02/24 1345  BP:  (!) 172/107  Pulse:  97  Resp: 16 16  Temp:  98.6 F (37 C)  SpO2:  97%    Body mass index is 27.55 kg/m.   Physical Exam Constitutional:      Appearance: Normal appearance. He is not ill-appearing.  HENT:     Head: Normocephalic.     Mouth/Throat:     Mouth: Mucous membranes are moist.     Pharynx: Oropharynx  is clear.  Eyes:     General: No scleral icterus. Cardiovascular:     Rate and Rhythm: Normal rate.  Pulmonary:     Effort: Pulmonary effort is normal.  Musculoskeletal:        General: Normal range of motion.     Cervical back: Normal range of motion.  Skin:    Coloration: Skin is not jaundiced or pale.  Neurological:     Mental Status: He is alert and oriented to person, place, and time.  Psychiatric:        Mood and Affect: Mood normal.        Judgment: Judgment normal.     BP (!) 172/107   Pulse 97   Temp 98.6 F (37 C) (Oral)   Resp 16   Wt 208 lb 12.8 oz (94.7 kg)   SpO2 97%   BMI 27.55 kg/m   Wt Readings from Last 3 Encounters:  07/02/24 208 lb 12.8 oz (94.7 kg)  12/20/23 212 lb 3.2 oz (96.3 kg)  07/23/23 212 lb (96.2 kg)     Health Maintenance Due  Topic Date Due   DTaP/Tdap/Td (1 - Tdap) Never done   Hepatitis B Vaccines 19-59 Average Risk (1 of 3 - 19+ 3-dose series) 02/04/1984   Zoster Vaccines- Shingrix  (1 of 2) Never done   Colonoscopy  Never done   Influenza Vaccine  12/28/2023   COVID-19 Vaccine (4 - 2025-26 season) 01/28/2024       Topic Date Due   Hepatitis B Vaccines 19-59 Average Risk (1 of 3 - 19+ 3-dose series) 02/04/1984    No results found for: TSH Lab Results  Component Value Date   WBC 8.7 07/14/2023   HGB 12.4 (L) 07/14/2023   HCT 37.6 (L) 07/14/2023   MCV 105.0 (H) 07/14/2023   PLT 196 07/14/2023   Lab Results  Component Value Date   NA 136  07/14/2023   K 4.2 07/14/2023   CO2 24 07/14/2023   GLUCOSE 112 (H) 07/14/2023   BUN 16 07/14/2023   CREATININE 1.33 (H) 07/14/2023   BILITOT 1.4 (H) 07/14/2023   ALKPHOS 99 07/14/2023   AST 21 07/14/2023   ALT 24 07/14/2023   PROT 7.8 07/14/2023   ALBUMIN  4.0 07/14/2023   CALCIUM  8.9 07/14/2023   ANIONGAP 9 07/14/2023   EGFR 54 (L) 12/18/2022   Lab Results  Component Value Date   CHOL 172 06/10/2020   Lab Results  Component Value Date   HDL 41 06/10/2020   Lab Results  Component Value Date   LDLCALC 111 (H) 06/10/2020   Lab Results  Component Value Date   TRIG 92 06/10/2020   Lab Results  Component Value Date   CHOLHDL 4.2 06/10/2020   No results found for: HGBA1C    Assessment & Plan:     Human immunodeficiency virus (HIV) infection -  HIV is well-controlled with an undetectable viral load. He is on Biktarvy  with one refill remaining. Insurance issues are being addressed to ensure continued coverage. - Continue Biktarvy  50-200-25 MG oral daily. - Ensured insurance coverage for Biktarvy . - Continue mailing medication through [employer]. - Entered orders for labs for him to come back to do once insurance is sorted out.  - Influenza vaccine when he comes back for labs   Elevated blood pressure -  BP Readings from Last 3 Encounters:  07/02/24 (!) 172/107  12/20/23 (!) 155/114  07/23/23 (!) 168/118  He says he will check blood pressure readings  at home but I suspect he will need to go on some medication for this.  Will reach out to him to discuss once labs are back further recommendations.  Mildly elevated creatinine that may be synthetic form bictegravir  - will check BMP with eGFR.  - Urinalysis  - diabetes screening  Colon cancer screening - He has not undergone recent colon cancer screening. A colonoscopy was performed approximately ten years ago. He prefers colonoscopy over Cologuard due to its specificity and ability to intervene if  necessary. - Will arrange referral for colonoscopy at a GI center in [place].  Hyperlipidemia - He is on Rosuvastatin  (Crestor ) 10 MG oral daily. - Continue Rosuvastatin  (Crestor ) 10 MG oral daily.  Recording duration: 26 minutes      Meds ordered this encounter  Medications   bictegravir-emtricitabine -tenofovir  AF (BIKTARVY ) 50-200-25 MG TABS tablet    Sig: Take 1 tablet by mouth daily.    Dispense:  30 tablet    Refill:  11    Supervising Provider:   SNIDER, CYNTHIA [phone]    Prescription Type::   Renewal   albuterol  (PROVENTIL ) (2.5 MG/3ML) 0.083% nebulizer solution    Sig: Take 3 mLs (2.5 mg total) by nebulization every 6 (six) hours as needed for wheezing or shortness of breath.    Dispense:  75 mL    Refill:  12    Supervising Provider:   LUIZ CHANNEL [phone]   Orders Placed This Encounter  Procedures   HIV 1 RNA quant-no reflex-bld    Standing Status:   Future    Expiration Date:   07/02/2025   T-helper cells (CD4) count    Standing Status:   Future    Expiration Date:   07/02/2025   Basic metabolic panel with GFR    Standing Status:   Future    Expiration Date:   07/02/2025   Urinalysis    Standing Status:   Future    Expiration Date:   07/02/2025   Hemoglobin A1c    Standing Status:   Future    Expiration Date:   07/02/2025   Amb Referral to Colonoscopy    Referral Priority:   Routine    Referral Type:   Consultation    Number of Visits Requested:   1     Corean Fireman, MSN, NP-C [facility]  [name] Pager: [phone] Office: [phone] [facility] Main Line: [phone] *Secure Chat Communication Welcome  "

## 2024-07-02 NOTE — Addendum Note (Signed)
 Addended by: MELVENIA COREAN SAILOR on: 07/02/2024 03:49 PM   Modules accepted: Orders

## 2024-07-02 NOTE — Patient Instructions (Signed)
 Schedule a lab appt and a flu shot appt with us  once we get everything sorted out with medicaid  Otherwise can see you back in 6 months

## 2024-07-04 ENCOUNTER — Other Ambulatory Visit: Payer: Self-pay

## 2024-07-04 ENCOUNTER — Other Ambulatory Visit: Payer: Self-pay | Admitting: Pharmacy Technician

## 2024-07-04 ENCOUNTER — Other Ambulatory Visit (HOSPITAL_COMMUNITY): Payer: Self-pay

## 2024-07-04 DIAGNOSIS — B20 Human immunodeficiency virus [HIV] disease: Secondary | ICD-10-CM

## 2024-07-04 MED ORDER — BIKTARVY 50-200-25 MG PO TABS: 1.0000 | ORAL_TABLET | Freq: Every day | ORAL | 5 refills | Status: AC

## 2024-07-04 NOTE — Progress Notes (Signed)
 Patient is currently enrolled into [facility] and medication will need to be filled at [facility] will disenroll from cr due to this.
